# Patient Record
Sex: Female | Born: 1945 | Race: White | Hispanic: No | Smoking: Former smoker
Health system: Southern US, Community
[De-identification: ages and names within clinical notes are randomized; demographics above are authoritative.]

## PROBLEM LIST (undated history)

## (undated) DIAGNOSIS — M1990 Unspecified osteoarthritis, unspecified site: Secondary | ICD-10-CM

## (undated) DIAGNOSIS — Z860100 Personal history of colon polyps, unspecified: Secondary | ICD-10-CM

## (undated) DIAGNOSIS — F419 Anxiety disorder, unspecified: Secondary | ICD-10-CM

## (undated) DIAGNOSIS — R110 Nausea: Secondary | ICD-10-CM

## (undated) DIAGNOSIS — R202 Paresthesia of skin: Secondary | ICD-10-CM

## (undated) DIAGNOSIS — E039 Hypothyroidism, unspecified: Secondary | ICD-10-CM

## (undated) DIAGNOSIS — H269 Unspecified cataract: Secondary | ICD-10-CM

## (undated) DIAGNOSIS — E119 Type 2 diabetes mellitus without complications: Secondary | ICD-10-CM

## (undated) DIAGNOSIS — M62838 Other muscle spasm: Secondary | ICD-10-CM

## (undated) DIAGNOSIS — R0600 Dyspnea, unspecified: Secondary | ICD-10-CM

## (undated) DIAGNOSIS — M810 Age-related osteoporosis without current pathological fracture: Secondary | ICD-10-CM

## (undated) DIAGNOSIS — E785 Hyperlipidemia, unspecified: Secondary | ICD-10-CM

## (undated) DIAGNOSIS — T7840XA Allergy, unspecified, initial encounter: Secondary | ICD-10-CM

## (undated) DIAGNOSIS — D649 Anemia, unspecified: Secondary | ICD-10-CM

## (undated) DIAGNOSIS — G4700 Insomnia, unspecified: Secondary | ICD-10-CM

## (undated) DIAGNOSIS — E079 Disorder of thyroid, unspecified: Secondary | ICD-10-CM

## (undated) DIAGNOSIS — R351 Nocturia: Secondary | ICD-10-CM

## (undated) DIAGNOSIS — G8929 Other chronic pain: Secondary | ICD-10-CM

## (undated) DIAGNOSIS — G4730 Sleep apnea, unspecified: Secondary | ICD-10-CM

## (undated) DIAGNOSIS — F32A Depression, unspecified: Secondary | ICD-10-CM

## (undated) DIAGNOSIS — R200 Anesthesia of skin: Secondary | ICD-10-CM

## (undated) DIAGNOSIS — F329 Major depressive disorder, single episode, unspecified: Secondary | ICD-10-CM

## (undated) DIAGNOSIS — Z8619 Personal history of other infectious and parasitic diseases: Secondary | ICD-10-CM

## (undated) DIAGNOSIS — K219 Gastro-esophageal reflux disease without esophagitis: Secondary | ICD-10-CM

## (undated) DIAGNOSIS — Z8719 Personal history of other diseases of the digestive system: Secondary | ICD-10-CM

## (undated) DIAGNOSIS — M2550 Pain in unspecified joint: Secondary | ICD-10-CM

## (undated) DIAGNOSIS — M797 Fibromyalgia: Secondary | ICD-10-CM

## (undated) DIAGNOSIS — M549 Dorsalgia, unspecified: Secondary | ICD-10-CM

## (undated) DIAGNOSIS — I10 Essential (primary) hypertension: Secondary | ICD-10-CM

## (undated) HISTORY — DX: Unspecified osteoarthritis, unspecified site: M19.90

## (undated) HISTORY — DX: Gastro-esophageal reflux disease without esophagitis: K21.9

## (undated) HISTORY — PX: EYE SURGERY: SHX253

## (undated) HISTORY — PX: HAND SURGERY: SHX662

## (undated) HISTORY — PX: CARPAL TUNNEL RELEASE: SHX101

## (undated) HISTORY — DX: Anemia, unspecified: D64.9

## (undated) HISTORY — PX: KNEE ARTHROSCOPY: SHX127

## (undated) HISTORY — DX: Allergy, unspecified, initial encounter: T78.40XA

## (undated) HISTORY — PX: ABDOMINAL HYSTERECTOMY: SHX81

## (undated) HISTORY — PX: KNEE ARTHROSCOPY: SUR90

## (undated) HISTORY — DX: Disorder of thyroid, unspecified: E07.9

## (undated) HISTORY — PX: WRIST SURGERY: SHX841

## (undated) HISTORY — PX: CHOLECYSTECTOMY: SHX55

## (undated) HISTORY — PX: FOOT SURGERY: SHX648

## (undated) HISTORY — PX: FRACTURE SURGERY: SHX138

## (undated) HISTORY — DX: Anxiety disorder, unspecified: F41.9

## (undated) HISTORY — PX: RECTOCELE REPAIR: SHX761

## (undated) HISTORY — PX: TONSILLECTOMY: SUR1361

## (undated) HISTORY — PX: CARDIAC CATHETERIZATION: SHX172

## (undated) HISTORY — PX: COLONOSCOPY: SHX174

## (undated) HISTORY — PX: JOINT REPLACEMENT: SHX530

---

## 1985-06-24 HISTORY — PX: HAND SURGERY: SHX662

## 1998-03-25 ENCOUNTER — Encounter: Admission: RE | Admit: 1998-03-25 | Discharge: 1998-06-23 | Payer: Self-pay | Admitting: Specialist

## 1999-05-17 ENCOUNTER — Other Ambulatory Visit: Admission: RE | Admit: 1999-05-17 | Discharge: 1999-05-17 | Payer: Self-pay | Admitting: Obstetrics and Gynecology

## 1999-08-17 ENCOUNTER — Ambulatory Visit (HOSPITAL_COMMUNITY): Admission: RE | Admit: 1999-08-17 | Discharge: 1999-08-17 | Payer: Self-pay | Admitting: *Deleted

## 1999-08-17 ENCOUNTER — Encounter (INDEPENDENT_AMBULATORY_CARE_PROVIDER_SITE_OTHER): Payer: Self-pay | Admitting: Specialist

## 2000-04-03 ENCOUNTER — Ambulatory Visit (HOSPITAL_BASED_OUTPATIENT_CLINIC_OR_DEPARTMENT_OTHER): Admission: RE | Admit: 2000-04-03 | Discharge: 2000-04-03 | Payer: Self-pay | Admitting: Orthopedic Surgery

## 2000-06-13 ENCOUNTER — Other Ambulatory Visit: Admission: RE | Admit: 2000-06-13 | Discharge: 2000-06-13 | Payer: Self-pay | Admitting: Internal Medicine

## 2001-01-11 ENCOUNTER — Ambulatory Visit (HOSPITAL_COMMUNITY): Admission: RE | Admit: 2001-01-11 | Discharge: 2001-01-11 | Payer: Self-pay | Admitting: Orthopedic Surgery

## 2001-04-07 ENCOUNTER — Ambulatory Visit (HOSPITAL_BASED_OUTPATIENT_CLINIC_OR_DEPARTMENT_OTHER): Admission: RE | Admit: 2001-04-07 | Discharge: 2001-04-07 | Payer: Self-pay | Admitting: Internal Medicine

## 2001-04-25 ENCOUNTER — Emergency Department (HOSPITAL_COMMUNITY): Admission: EM | Admit: 2001-04-25 | Discharge: 2001-04-25 | Payer: Self-pay | Admitting: Emergency Medicine

## 2001-04-25 ENCOUNTER — Encounter: Payer: Self-pay | Admitting: Emergency Medicine

## 2002-09-13 ENCOUNTER — Inpatient Hospital Stay (HOSPITAL_COMMUNITY): Admission: EM | Admit: 2002-09-13 | Discharge: 2002-09-16 | Payer: Self-pay | Admitting: *Deleted

## 2002-12-16 ENCOUNTER — Encounter: Admission: RE | Admit: 2002-12-16 | Discharge: 2002-12-16 | Payer: Self-pay | Admitting: Orthopedic Surgery

## 2002-12-16 ENCOUNTER — Encounter: Payer: Self-pay | Admitting: Orthopedic Surgery

## 2003-10-29 ENCOUNTER — Ambulatory Visit (HOSPITAL_COMMUNITY): Admission: RE | Admit: 2003-10-29 | Discharge: 2003-10-29 | Payer: Self-pay | Admitting: *Deleted

## 2003-10-29 ENCOUNTER — Encounter (INDEPENDENT_AMBULATORY_CARE_PROVIDER_SITE_OTHER): Payer: Self-pay | Admitting: *Deleted

## 2004-01-11 ENCOUNTER — Encounter: Admission: RE | Admit: 2004-01-11 | Discharge: 2004-01-11 | Payer: Self-pay | Admitting: Internal Medicine

## 2005-01-22 ENCOUNTER — Emergency Department (HOSPITAL_COMMUNITY): Admission: EM | Admit: 2005-01-22 | Discharge: 2005-01-22 | Payer: Self-pay | Admitting: Family Medicine

## 2005-03-24 ENCOUNTER — Ambulatory Visit (HOSPITAL_COMMUNITY): Admission: RE | Admit: 2005-03-24 | Discharge: 2005-03-24 | Payer: Self-pay | Admitting: *Deleted

## 2005-05-25 ENCOUNTER — Emergency Department (HOSPITAL_COMMUNITY): Admission: EM | Admit: 2005-05-25 | Discharge: 2005-05-25 | Payer: Self-pay | Admitting: Emergency Medicine

## 2005-06-17 ENCOUNTER — Emergency Department (HOSPITAL_COMMUNITY): Admission: EM | Admit: 2005-06-17 | Discharge: 2005-06-17 | Payer: Self-pay | Admitting: Family Medicine

## 2005-06-21 ENCOUNTER — Emergency Department (HOSPITAL_COMMUNITY): Admission: EM | Admit: 2005-06-21 | Discharge: 2005-06-22 | Payer: Self-pay | Admitting: Emergency Medicine

## 2005-12-11 ENCOUNTER — Emergency Department (HOSPITAL_COMMUNITY): Admission: EM | Admit: 2005-12-11 | Discharge: 2005-12-11 | Payer: Self-pay | Admitting: Emergency Medicine

## 2005-12-12 ENCOUNTER — Emergency Department (HOSPITAL_COMMUNITY): Admission: EM | Admit: 2005-12-12 | Discharge: 2005-12-12 | Payer: Self-pay | Admitting: Family Medicine

## 2006-06-11 ENCOUNTER — Encounter: Admission: RE | Admit: 2006-06-11 | Discharge: 2006-06-11 | Payer: Self-pay | Admitting: Internal Medicine

## 2006-09-20 ENCOUNTER — Encounter: Admission: RE | Admit: 2006-09-20 | Discharge: 2006-09-20 | Payer: Self-pay | Admitting: Internal Medicine

## 2006-11-28 DIAGNOSIS — I10 Essential (primary) hypertension: Secondary | ICD-10-CM | POA: Insufficient documentation

## 2006-11-28 DIAGNOSIS — F32A Depression, unspecified: Secondary | ICD-10-CM | POA: Insufficient documentation

## 2007-07-26 HISTORY — PX: PITUITARY SURGERY: SHX203

## 2008-08-06 ENCOUNTER — Other Ambulatory Visit: Admission: RE | Admit: 2008-08-06 | Discharge: 2008-08-06 | Payer: Self-pay | Admitting: Obstetrics & Gynecology

## 2008-08-06 ENCOUNTER — Ambulatory Visit: Payer: Self-pay | Admitting: Obstetrics & Gynecology

## 2008-08-26 ENCOUNTER — Ambulatory Visit: Payer: Self-pay | Admitting: Obstetrics & Gynecology

## 2010-05-31 ENCOUNTER — Ambulatory Visit (HOSPITAL_COMMUNITY): Admission: RE | Admit: 2010-05-31 | Discharge: 2010-05-31 | Payer: Self-pay | Admitting: Endocrinology

## 2011-03-03 ENCOUNTER — Emergency Department (HOSPITAL_COMMUNITY): Payer: No Typology Code available for payment source

## 2011-03-03 ENCOUNTER — Emergency Department (HOSPITAL_COMMUNITY)
Admission: EM | Admit: 2011-03-03 | Discharge: 2011-03-03 | Disposition: A | Discharge status: Home / Self Care | Payer: No Typology Code available for payment source | Attending: Emergency Medicine | Admitting: Emergency Medicine

## 2011-03-03 DIAGNOSIS — R51 Headache: Secondary | ICD-10-CM | POA: Insufficient documentation

## 2011-03-03 DIAGNOSIS — E039 Hypothyroidism, unspecified: Secondary | ICD-10-CM | POA: Insufficient documentation

## 2011-03-03 DIAGNOSIS — S139XXA Sprain of joints and ligaments of unspecified parts of neck, initial encounter: Secondary | ICD-10-CM | POA: Insufficient documentation

## 2011-03-03 DIAGNOSIS — M545 Low back pain, unspecified: Secondary | ICD-10-CM | POA: Insufficient documentation

## 2011-03-03 DIAGNOSIS — S335XXA Sprain of ligaments of lumbar spine, initial encounter: Secondary | ICD-10-CM | POA: Insufficient documentation

## 2011-03-03 DIAGNOSIS — M25559 Pain in unspecified hip: Secondary | ICD-10-CM | POA: Insufficient documentation

## 2011-03-03 DIAGNOSIS — M542 Cervicalgia: Secondary | ICD-10-CM | POA: Insufficient documentation

## 2011-03-03 DIAGNOSIS — Z7982 Long term (current) use of aspirin: Secondary | ICD-10-CM | POA: Insufficient documentation

## 2011-03-03 DIAGNOSIS — I10 Essential (primary) hypertension: Secondary | ICD-10-CM | POA: Insufficient documentation

## 2011-03-03 DIAGNOSIS — Z79899 Other long term (current) drug therapy: Secondary | ICD-10-CM | POA: Insufficient documentation

## 2011-03-03 DIAGNOSIS — I2510 Atherosclerotic heart disease of native coronary artery without angina pectoris: Secondary | ICD-10-CM | POA: Insufficient documentation

## 2011-04-18 ENCOUNTER — Ambulatory Visit (HOSPITAL_COMMUNITY)
Admission: RE | Admit: 2011-04-18 | Discharge: 2011-04-18 | Disposition: A | Discharge status: Home / Self Care | Payer: No Typology Code available for payment source | Source: Ambulatory Visit | Attending: Physical Therapy | Admitting: Physical Therapy

## 2011-04-18 DIAGNOSIS — M545 Low back pain, unspecified: Secondary | ICD-10-CM | POA: Insufficient documentation

## 2011-04-18 DIAGNOSIS — R269 Unspecified abnormalities of gait and mobility: Secondary | ICD-10-CM | POA: Insufficient documentation

## 2011-04-18 DIAGNOSIS — R209 Unspecified disturbances of skin sensation: Secondary | ICD-10-CM | POA: Insufficient documentation

## 2011-04-18 DIAGNOSIS — R262 Difficulty in walking, not elsewhere classified: Secondary | ICD-10-CM | POA: Insufficient documentation

## 2011-04-18 DIAGNOSIS — M546 Pain in thoracic spine: Secondary | ICD-10-CM | POA: Insufficient documentation

## 2011-04-18 DIAGNOSIS — IMO0001 Reserved for inherently not codable concepts without codable children: Secondary | ICD-10-CM | POA: Insufficient documentation

## 2011-04-20 ENCOUNTER — Ambulatory Visit (HOSPITAL_COMMUNITY)
Admission: RE | Admit: 2011-04-20 | Discharge: 2011-04-20 | Disposition: A | Discharge status: Home / Self Care | Payer: No Typology Code available for payment source | Source: Ambulatory Visit | Attending: *Deleted | Admitting: *Deleted

## 2011-04-25 ENCOUNTER — Ambulatory Visit (HOSPITAL_COMMUNITY)
Admission: RE | Admit: 2011-04-25 | Discharge: 2011-04-25 | Disposition: A | Discharge status: Home / Self Care | Payer: No Typology Code available for payment source | Source: Ambulatory Visit | Attending: Physical Therapy | Admitting: Physical Therapy

## 2011-04-25 DIAGNOSIS — IMO0001 Reserved for inherently not codable concepts without codable children: Secondary | ICD-10-CM | POA: Insufficient documentation

## 2011-04-25 DIAGNOSIS — R269 Unspecified abnormalities of gait and mobility: Secondary | ICD-10-CM | POA: Insufficient documentation

## 2011-04-25 DIAGNOSIS — M545 Low back pain, unspecified: Secondary | ICD-10-CM | POA: Insufficient documentation

## 2011-04-25 DIAGNOSIS — R262 Difficulty in walking, not elsewhere classified: Secondary | ICD-10-CM | POA: Insufficient documentation

## 2011-04-25 DIAGNOSIS — M546 Pain in thoracic spine: Secondary | ICD-10-CM | POA: Insufficient documentation

## 2011-04-25 DIAGNOSIS — R209 Unspecified disturbances of skin sensation: Secondary | ICD-10-CM | POA: Insufficient documentation

## 2011-04-27 ENCOUNTER — Ambulatory Visit (HOSPITAL_COMMUNITY): Payer: No Typology Code available for payment source | Admitting: *Deleted

## 2011-05-02 ENCOUNTER — Ambulatory Visit (HOSPITAL_COMMUNITY)
Admission: RE | Admit: 2011-05-02 | Discharge: 2011-05-02 | Disposition: A | Discharge status: Home / Self Care | Payer: No Typology Code available for payment source | Source: Ambulatory Visit | Attending: *Deleted | Admitting: *Deleted

## 2011-05-04 ENCOUNTER — Ambulatory Visit (HOSPITAL_COMMUNITY)
Admission: RE | Admit: 2011-05-04 | Discharge: 2011-05-04 | Disposition: A | Discharge status: Home / Self Care | Payer: No Typology Code available for payment source | Source: Ambulatory Visit | Attending: *Deleted | Admitting: *Deleted

## 2011-05-09 ENCOUNTER — Ambulatory Visit (HOSPITAL_COMMUNITY)
Admission: RE | Admit: 2011-05-09 | Discharge: 2011-05-09 | Disposition: A | Discharge status: Home / Self Care | Payer: No Typology Code available for payment source | Source: Ambulatory Visit | Attending: *Deleted | Admitting: *Deleted

## 2011-05-09 NOTE — Group Therapy Note (Signed)
[name], [name]                 ACCOUNT NO.:  [id number]   MEDICAL RECORD NO.:  [id number]          PATIENT TYPE:  WOC   LOCATION:  [facility]                   FACILITY:  [facility]   PHYSICIAN:  Sid Falcon, CNM  DATE OF BIRTH:  1946-08-03   DATE OF SERVICE:                                  CLINIC NOTE   This patient is here for followup result after a punch biopsy on August 06, 2008.  The patient was seen on that date as a referral from [facility] for lesions in bilateral labial area, scaly  erythematous patches and stenotic vaginal introitus.  The patient was  diagnosed that day with vaginal yeast and atrophic changes with age.  The patient was prescribed Diflucan 150 mg weekly times 2 weeks and a  punch biopsy was sent to Pathology.  The results of the punch biopsy was  benign squamous-lined mucosa with chronic inflammation and  hyperkeratosis.  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  Today the patient  was given the Zosyn and reported that the patient is doing well with  medicated powder that she has been using and the symptoms have improved.  It was recommended to the patient that she use triamcinolone 0.5% b.i.d.  times 1 week and then every day if the symptoms return and the patient  is to follow up in 1 month for assessment.  I consulted with Dr. Penne Lash  and Dr. Penne Lash agrees with the plan.      Sid Falcon, CNM     WM/MEDQ  D:  08/26/2008  T:  08/27/2008  Job:  [id number]

## 2011-05-11 ENCOUNTER — Ambulatory Visit (HOSPITAL_COMMUNITY)
Admission: RE | Admit: 2011-05-11 | Discharge: 2011-05-11 | Disposition: A | Discharge status: Home / Self Care | Payer: No Typology Code available for payment source | Source: Ambulatory Visit | Attending: *Deleted | Admitting: *Deleted

## 2011-05-12 NOTE — Op Note (Signed)
[facility]  Patient:    [name], [name]                        MRN: [id number] Proc. Date: 04/03/00 Adm. Date:  [id number] Attending:  Sypher, Douglass Rivers CC:         Katy Fitch. Sypher, Montez Hageman., M.D. (2)                           Operative Report  PREOPERATIVE DIAGNOSIS:  Entrapment neuropathy, median nerve, left carpal tunnel.  POSTOPERATIVE DIAGNOSIS:  Entrapment neuropathy, median nerve, left carpal tunnel.  OPERATION PERFORMED:  Release of left transverse carpal ligament.  OPERATING SURGEON:  Josephine Igo, M.D.  ASSISTANT:  Gigi Gin, P.A.  ANESTHESIA:  General by mask.  ANESTHESIOLOGIST:  Dr. Katrinka Blazing.  INDICATIONS:  The patient is a 65 year old woman who has had numbness in both hands.  Clinical examination revealed evidence of bilateral carpal tunnel syndrome.  Electrodiagnostic studies confirmed median neuropathy at the wrist bilateral.  She is status post right carpal tunnel release with good result and requested similar surgery on the left.  She understands the potential risks and benefits of surgery.  DESCRIPTION OF PROCEDURE:  [name] was brought to the operating room and placed in supine position on the operating table.  Following induction of genreal anesthesia, the left arm was prepped with Betadine soap and solution and sterilely draped.  Following exsanguination of the limb with an Esmarch bandage, the arterial tourniquet was inflated to 220 mmHg.  The procedure commenced with a short incision in line of the ring finger in the palm.  The subcutaneous tissues were carefully divided revealing the palmar fascia.  This was split longitudinally to reveal the common sensory branch of the median nerve.  These were followed back to the transverse carpal ligament which was carefully isolated from the median nerve.  The ligament was released on its ulnar border with scissors extending into the distal forearm.  This widely  opened the carpal canal.  Bleeding points along the margin of the released ligament were electrocauterized with bipolar current followed by repair of the skin with intradermal 3-0 Prolene suture.  A compressive dressing was applied with a volar plaster splint maintaining the wrist in 5 degrees dorsiflexon.  For aftercare, Ms. Crafton is given a prescription for Percocet 5/325 one or two tablets p.o. q.4-6h. p.r.n. pain, 20 tablets, no refill. DD:  04/03/00 TD:  04/03/00 Job: [id number]

## 2011-05-12 NOTE — Cardiovascular Report (Signed)
   NAME:  [name], [name]                           ACCOUNT NO.:  [id number]   MEDICAL RECORD NO.:  [id number]                   PATIENT TYPE:  INP   LOCATION:  3731                                 FACILITY:  [facility]   PHYSICIAN:  W. Ashley Royalty., M.D.         DATE OF BIRTH:  02-23-1946   DATE OF PROCEDURE:  09/15/2002  DATE OF DISCHARGE:  09/16/2002                              CARDIAC CATHETERIZATION   INDICATION:  The patient is a 65 year old female with chest pain suggestive  of unstable angina with borderline enzymes.   COMMENTS ABOUT PROCEDURE:  The patient tolerated the procedure well without  complications.  Following the procedure she had good hemostasis and  peripheral pulses present.   HEMODYNAMIC DATA:  Aorta post-contrast 136/75, LV post-contrast 136/0-12.   ANGIOGRAPHIC DATA:   LEFT VENTRICULOGRAM:  Left ventriculogram performed in the 30-degree RAO  projection. Aortic valve was normal. The mitral valve is normal. The left  ventricle appears normal in size.  The estimated ejection fraction is 60%.  Coronary arteries arise and distribute normally. There was minimal  calcification noted in the left coronary system.   The left main coronary artery:  Appears normal.   Left anterior descending:  The vessel ends on the distal anterior wall and  has a large first diagonal branch. There is a mid vessel 30-40% stenosis  that involves the origin of the diagonal branch.   The circumflex coronary artery:  This is a large vessel with two marginal  branches that have moderate degrees of tortuosity to them.  Minimal  irregularities are noted.   The right coronary artery:  A large dominant vessel of posterior descending  artery supplies the apex.  There are moderate irregularities noted with  this.  No focal obstructive stenoses noted.    IMPRESSION:  1. Mild to moderate coronary artery disease without evidence of severe focal     obstructive stenoses noted.  2. Normal  left ventricular function.                                                    Darden Palmer., M.D.    WST/MEDQ  D:  09/15/2002  T:  09/17/2002  Job:  [id number]   cc:   Lennon Alstrom. Felipa Eth, M.D.   Thomas A. Patty Sermons, M.D.

## 2011-05-12 NOTE — Op Note (Signed)
   NAME:  [name], [name]                           ACCOUNT NO.:  [id number]   MEDICAL RECORD NO.:  [id number]                   PATIENT TYPE:  AMB   LOCATION:  ENDO                                 FACILITY:  [facility]   PHYSICIAN:  Georgiana Spinner, M.D.                 DATE OF BIRTH:  19-Feb-1946   DATE OF PROCEDURE:  10/29/2003  DATE OF DISCHARGE:                                 OPERATIVE REPORT   PROCEDURE:  Upper endoscopy with biopsy.   INDICATIONS FOR PROCEDURE:  Hemoccult positivity.   ANESTHESIA:  Demerol 50, Versed 5 mg.   DESCRIPTION OF PROCEDURE:  With the patient mildly sedated in the left  lateral decubitus position, the Olympus videoscopic endoscope was inserted  in the mouth, passed under direct vision through the esophagus which  appeared normal into the stomach. Fundus, body, antrum were visualized and  blood was seen in the antrum in flecks.  The mucosa appeared fairly  erythematous.  This was photographed and subsequently biopsied.  Duodenal  bulb and second portion of the duodenum appeared normal.  From this point,  the endoscope was slowly withdrawn, taking circumferential views of the  duodenal mucosa until the endoscope then pulled back into the stomach,  placed in retroflexion to view the stomach from below. The endoscope was  then straightened and withdrawn taking circumferential views of the  remaining gastric and esophageal mucosa.  The patient's vital signs and  pulse oximetry remained stable.  The patient tolerated the procedure well  without apparent complications.   FINDINGS:  Erythematous gastric mucosa consistent with gastritis with blood  flecks seen in the stomach. Await biopsy report. The patient will call me  for results and follow up with me as an outpatient.  Proceed to colonoscopy  as planned.                                               Georgiana Spinner, M.D.    GMO/MEDQ  D:  10/29/2003  T:  10/29/2003  Job:  [id number]

## 2011-05-12 NOTE — Op Note (Signed)
   NAME:  [name], [name]                           ACCOUNT NO.:  [id number]   MEDICAL RECORD NO.:  [id number]                   PATIENT TYPE:  AMB   LOCATION:  ENDO                                 FACILITY:  [facility]   PHYSICIAN:  Georgiana Spinner, M.D.                 DATE OF BIRTH:  07-09-1946   DATE OF PROCEDURE:  DATE OF DISCHARGE:                                 OPERATIVE REPORT   PROCEDURE:  Colonoscopy.   INDICATIONS:  Hemoccult positivity.   ANESTHESIA:  Demerol 30 and Versed 3 mg.   PROCEDURE:  With the patient mildly sedated in the left lateral decubitus  position, the Olympus videoscopic colonoscope was inserted in the rectum and  passed under direct vision to the cecum, identified by the ileocecal valve  and appendiceal orifice, the former of which was photographed.  Actually,  they were both photographed, but we only got one picture of the valve.  From  this point, the colonoscope was slowly withdrawn, taking circumferential  views of the colonic mucosa, stopping only in the rectum, which appeared  normal on direct and retroflexed view.  The endoscope was straightened and  withdrawn.  The patient's vital signs and pulse oximeter remained stable.  The patient tolerated the procedure well without apparent complications.   FINDINGS:  Unremarkable colonoscopic examination.   PLAN:  See endoscopy note for further details.                                               Georgiana Spinner, M.D.    GMO/MEDQ  D:  10/29/2003  T:  10/29/2003  Job:  [id number]

## 2011-05-12 NOTE — Consult Note (Signed)
NAME:  [name], [name]                           ACCOUNT NO.:  [id number]   MEDICAL RECORD NO.:  [id number]                   PATIENT TYPE:  INP   LOCATION:  3731                                 FACILITY:  [facility]   PHYSICIAN:  Maisie Fus A. Patty Sermons, M.D.           DATE OF BIRTH:  May 25, 1946   DATE OF CONSULTATION:  09/14/2002  DATE OF DISCHARGE:                                   CONSULTATION   CHIEF COMPLAINT:  Chest pain.   HISTORY OF PRESENT ILLNESS:  This is a 65 year old married Caucasian female  admitted with chest pain.  About a week ago she begun noticing the onset of  a vague discomfort in the precordial area.  It was worse yesterday, and she  tried to go to work, but pain worsened and was not relieved by muscle  relaxer or by Vicodin or by Ultram.  She went to [facility]  where she was evaluated, and a chest x-ray suggested possibly widened  mediastinum, and she was sent by ambulance to [facility].  The CT scan at [facility] did  not show any widening of the mediastinum, but the patient was admitted on a  rule out myocardial infarction protocol.  The pain that she has been  experiencing is precordial.  She has also been noticing some numbness of  both hands.  The pain is sharp and is worse with deep breathing.  The  patient does not have any history of angina pectoris.  She is not diabetic,  but does have a history of hypertension.  She also has a history of a hiatal  hernia.  She noted mild diaphoresis with the pain yesterday, but no nausea  or vomiting.  Today, she continues to have chest discomfort relieved by pain  medication.   MEDICATIONS:  1. Diovan HCT 160/12.5 one q.d.  2. Effexor 150 mg q.d.  3. Vitamin E q.d.  4. Aspirin 81 mg q.d.  5. Ranitidine or Prevacid p.r.n.  6. Iron q.d.  7. Ultram p.r.n. for her back pain.  8. Fosamax as directed.  9. Calcium.   FAMILY HISTORY:  The patient's father died with Alzheimer's at age 33.  The  patient's mother is  alive at age 66.  Mother has diabetes.  The patient has  two brothers both with coronary disease, and her younger brother has had  bypass x5.   ALLERGIES:  SULFA, V-CILLIN.   REVIEW OF SYMPTOMS:  CARDIOVASCULAR:  No history of stroke or transient  ischemic attack.  Her cholesterols have been borderline high in the past,  but she has not been on statin drugs until this time.  There has been no  awareness of racing of her heart.   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  She is  a non-smoker.  She is married for the second time and has two children by  the previous marriage.   PHYSICAL EXAMINATION:  VITAL SIGNS:  Blood pressure is 130/76, pulse 84 and  regular, respirations are normal.  HEENT:  Negative.  Pupils are equal and reactive.  Sclerae are clear.  NECK:  Jugular venous pressure normal.  Carotids normal.  Thyroid normal.  CHEST:  Clear.  HEART:  A quiet precordium without murmur, gallop, rub, or click.  She is  tender in the lower sternum on palpation.  ABDOMEN:  Soft and nontender without hepatosplenomegaly or masses, and she  has a previous cholecystectomy.  EXTREMITIES:  Good peripheral pulses and no phlebitis or edema.   LABORATORY DATA:  Electrocardiogram shows normal sinus rhythm and is within  normal limits.  Laboratory studies show sodium of 143, potassium 3.0, BUN 9,  creatinine 0.6.  Her initial CK was 2.8, subsequent 5.5.  Initial troponin-I  of 0.01, subsequent was 0.02.   IMPRESSION:  1. Chest pain, uncertain etiology with borderline CK-MB's, rule out ischemic     heart disease or acute coronary syndrome.  2. Superimposed chest wall pain with chest wall tenderness.  3. Multiple cardiac risk factors, including hypertension, possibly     hypercholesterolemia, and a strong family history of manifested coronary     disease.  4. Hypokalemia, presumed secondary to Thiazide diuretic.   DISPOSITION:  1. Replete potassium.  2.  Diagnostic left heart cardiac catheterization will be scheduled for     Monday a.m. with one of my partners.                                               Thomas A. Patty Sermons, M.D.    TAB/MEDQ  D:  09/14/2002  T:  09/14/2002  Job:  [id number]   cc:   Daleen Bo R. Felipa Eth, M.D.   Darden Palmer., M.D.  [address], [address]  [place]  [place] [address]  Fax: [phone]   Rolm Gala, M.D.  [facility]

## 2011-05-12 NOTE — Op Note (Signed)
[name], GORIS                 ACCOUNT NO.:  [id number]   MEDICAL RECORD NO.:  [id number]          PATIENT TYPE:  AMB   LOCATION:  ENDO                         FACILITY:  [facility]   PHYSICIAN:  Georgiana Spinner, M.D.    DATE OF BIRTH:  Apr 12, 1946   DATE OF PROCEDURE:  DATE OF DISCHARGE:                                 OPERATIVE REPORT   PROCEDURE:  Colonoscopy.   INDICATIONS:  Question of colon polyps   ANESTHESIA:  Demerol 70, Versed 8 mg.   PROCEDURE:  With the patient mildly sedated in the left lateral decubitus  position, the Olympus videoscopic colonoscope was inserted in the rectum and  passed under direct vision. With pressure applied to the abdomen, we reached  the cecum identified by ileocecal valve and appendiceal orifice. We  thoroughly looked at the cecum and found absolutely no evidence of polyps in  this area.  The colonoscope was then slowly withdrawn, taking  circumferential views of the colonic mucosa, stopping only in the rectum  which appeared normal on direct and retroflexed view. The endoscope was  straightened and withdrawn. The patient's vital signs, pulse oximetry  remained stable. The patient tolerated the procedure well without apparent  complications.   FINDINGS:  Negative examination including a thorough examination of the  cecum. No evidence of polyp.   PLAN:  Have the patient follow-up with me as needed.      GMO/MEDQ  D:  03/24/2005  T:  03/24/2005  Job:  [id number]   cc:   Larina Earthly, M.D.  [address]  [place]  [place] [address]  Fax: [phone]

## 2011-05-12 NOTE — H&P (Signed)
NAME:  [name], [name]                           ACCOUNT NO.:  [id number]   MEDICAL RECORD NO.:  [id number]                   PATIENT TYPE:  EMS   LOCATION:  MAJO                                 FACILITY:  [facility]   PHYSICIAN:  Ravi R. Felipa Eth, M.D.                  DATE OF BIRTH:  [birth year]   DATE OF ADMISSION:  09/13/2002  DATE OF DISCHARGE:                                HISTORY & PHYSICAL   CHIEF COMPLAINT:  Chest pain.   HISTORY OF PRESENT ILLNESS:  This is a 65 year old Caucasian female well-  known to me from my outpatient practice who has a past medical history  significant for hypertension, obesity, gastroesophageal reflux disease,  questionable fibromyalgia, degenerative disc disease, osteopenia and mild  hyperlipidemia; who presented to the local urgent care with one-week history  of bandlike chest pain that gets worse upon turning over in bed, moving  around, or deep breathing.  The patient states that this chest discomfort  was not associated with shortness of breath, nausea or diaphoresis. The pain  was more pronounced on the right side of her chest wall radiating into the  middle part of her chest and then out to the left side of her chest.  The  patient denies any radiation to the neck or shoulder area.  The patient  denies any fevers, chills, orthopnea, cough, nausea, or recent injury.  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  Given the fact that the pain  worsened today, the patient presented to urgent care where a chest x-ray  revealed a widened mediastinum.  Subsequently the patient was transferred to  the emergency room at [facility]. [facility], given the question of  aortic dissection.  Chest CT here was negative for pulmonary embolism and  aortic dissection.  Given the patient's risk factors including a family  history of early heart disease, her hypertension and mild  hyperlipidemia,  the patient is currently being admitted for rule out myocardial infarction  on the observation protocol.   REVIEW OF SYSTEMS:  As above.   PROBLEM LIST:  1. Hypertension.  2. Obesity.  3. Gastroesophageal reflux disease with negative EGD in October of 2000.  4. Edema.  5. History of fibrositis myofascial.  6. History of sinusitis.  7. History of negative treadmill test in October 1999.  8. Lumbar degenerative disc disease.  9. History of knee surgery February 2002.  10.      History of osteopenia by Dexascan.  11.      History of hysterectomy in 1970.  12.      Mild hyperlipidemia.  13.      History of echocardiogram  October 2002, revealing mild left     ventricular hypertrophy and diastolic dysfunction.   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  Denies any tobacco or alcohol abuse.   FAMILY HISTORY:  Significant for early heart disease and hypertension.   ALLERGIES:  SULFA, PENICILLIN.   CURRENT MEDICATIONS:  1. Diovan HCT 160/12.5 one p.o. q.d.  2. Effexor XR 150 mg po q.a.m. and 75 mg at noon.  3. Iron supplementation twice daily.  4. Fosamax 70 mg p.o. q. week, however, the patient is currently out.  5. Potassium chloride 40 mEq p.o. q.d.  6. Ultram as needed.  7. The patient does have a history of using Bextra 10 mg each day, however,     resulting in some mild GI upset.  8. The patient also takes vitamin E and baby aspirin each day.   LABORATORY DATA:  Sodium 141, potassium 3.1, BUN 15, creatinine 0.9, glucose  94.  Hemoglobin 13, hematocrit 37%. ABG 7.446 pH, pCO2 38, pO2 94, bicarb  26, 98% oxygen saturation on room air.  Liver function tests, D-dimer, CK,  CK-MB and troponin I are all pending.   Chest CT with contrast reveals no evidence of pulmonary embolism or aortic  dissection, otherwise normal.   Review of office records from August 2003, reveals total cholesterol of 209,   triglycerides 208, HDL cholesterol 72, LDL cholesterol 95.  Normal  comprehensive metabolic profile.  Normal TSH at 1.99.  Normal urinalysis.  Hemoglobin A1C 5.3%.  White blood cell count 9.1, hemoglobin 12.7.   Echocardiogram  from October 14, 2001, reveals left ventricular hypertrophy,  moderate, with evidence of diastolic dysfunction with no significant  valvular abnormalities.  EKG here reveals sinus bradycardia, otherwise  normal.   PHYSICAL EXAMINATION:  GENERAL APPEARANCE:  An obese Caucasian female in no  apparent distress, status post morphine administration, who is conversant  and appropriate.  VITAL SIGNS:  Temperature 97.9 degrees F, blood pressure 152/83, pulse 104,  respiratory rate 14 and nonlabored, oxygen saturation 99% on room air.  Most  recent weight in our office is 204 pounds.  HEENT:  Head normocephalic and atraumatic.  Sclerae are anicteric.  Extraocular movements are intact. There are no oropharyngeal lesions.  No  sinus tenderness.  There are no carotid bruits.  NECK:  Supple. There is no thyromegaly.  There is no axillary  lymphadenopathy.  LUNGS:  Clear to auscultation bilaterally.  CARDIOVASCULAR:  Regular rate and rhythm with no S3 or S4 appreciated, nor  are there any murmurs appreciated.  Palpation of the chest wall reveals  diffuse tenderness and multiple trigger points in the upper torso.  ABDOMEN:  Soft, nontender, nondistended abdomen with bowel sounds present  throughout. There is no hepatosplenomegaly.  EXTREMITIES:  No edema with pulses intact in all four extremities.  No  evidence of cyanosis.  There is no evidence of synovitis.   ASSESSMENT/PLAN:  1. Atypical chest pain consistent with musculoskeletal origin.  Follow-up on     cardiac enzymes, D-dimer and rule out myocardial infarction by serial     enzymes.  If all negative, will perform outpatient Cardiolite.  The    patient may need Cox-II inhibitor; however, she has been susceptible to      these with nausea in the past.  May need prednisone taper and/or possible     treatment of questionable fibromyalgia.  2. Hypertension.  Stable on angiotensin receptor blocker and     hydrochlorothiazide.  May need augmentation of her     therapy if blood pressure  continues to be slightly high on an outpatient     basis.  3. Questionable fibromyalgia with multiple trigger points.  May need an     increased dose of Effexor and/or muscle relaxer.                                                 Ravi R. Felipa Eth, M.D.    RRA/MEDQ  D:  09/13/2002  T:  09/16/2002  Job:  [id number]

## 2011-05-12 NOTE — Discharge Summary (Signed)
NAME:  [name], [name]                           ACCOUNT NO.:  [id number]   MEDICAL RECORD NO.:  [id number]                   PATIENT TYPE:  INP   LOCATION:  3731                                 FACILITY:  [facility]   PHYSICIAN:  Larina Earthly, MD                       DATE OF BIRTH:  [birth year]   DATE OF ADMISSION:  09/13/2002  DATE OF DISCHARGE:  09/16/2002                                 DISCHARGE SUMMARY   DISCHARGE DIAGNOSES:  1. Mild to moderate coronary artery disease status post cardiac     catheterization by Dr. Georga Hacking on 09/15/2002 with one of three     positive cardiac enzymes.  2. Hypertension.  3. Questionable fibromyalgia with multiple trigger points.  4. Obesity.  5. Gastroesophageal reflux disease.  6. Edema.  7. History of fibrositis myofascia.  8. Lumbar degenerative disk disease.  9. Osteopenia.  10.      Mild hyperlipidemia.  11.      History of echocardiogram from October 2002 revealing mild left     ventricular hypertrophy and diastolic dysfunction.   DISCHARGE MEDICATIONS:  1. Diovan HCT 160/12.5 1 p.o. q.d.  2. Effexor XR 150 p.o. q.a.m. and 75 at noon.  3. Iron supplementation twice daily.  4. Fosamax 70 mg p.o. weekly.  5. Potassium chloride 40 mEq p.o. q.d.  6. Ultram as needed.  7. Vicodin 1 to 2 tablets every 6 hours as needed for pain.  8. Enteric-coated baby aspirin 1 p.o. q.d.   LABORATORY DATA:  On admission, white blood cell count 6.5, hemoglobin 10.8,  hematocrit 33.6%, platelet count 226.  PT 13.3 seconds, PTT 131 seconds on  heparin.  Admission sodium 141, potassium 3.1, chloride 107, glucose 94, BUN  15, creatinine 0.9, total protein 6.4, albumin 3.4.  AST 16, ALT 20,  alkaline phosphatase 107, total bilirubin 0.4.  Cardiac enzymes first set:  CK 94, CK-MB 2.8, troponin I 0.01.  Second set: CK 124, CK-MB 5.5, troponin  I 0.02.  Third set: CK 120, CK-MB 3.8, troponin I 0.02.  Total cholesterol  187, triglycerides 99, HDL 51, LDL  116.   EKG on 09/15/2002 reveals normal sinus rhythm, left axis deviation, minimal  voltage criteria for LVH.   Cardiac catheterization from 09/15/2002 reveals a 30 to 40% stenosis in the  left anterior descending artery, otherwise mild tortuosity to the circumflex  and right coronary artery with final impression being mild to moderate  coronary artery disease without evidence of severe focal obstructive  stenosis, normal left ventricular function.   CT of the chest on 09/13/2002 was unremarkable.  No pulmonary emboli were  identified, negative for dissection, and negative for aneurysm.   HISTORY OF PRESENT ILLNESS:  This is a 65 year old Caucasian female who has  a past medical history significant for hypertension, obesity,  gastroesophageal reflux disease, questionable fibromyalgia, degenerative  disk disease, osteopenia, and mild hyperlipidemia, who presented to the  local Urgent Care with a one-week history of band-like chest pain that  worsened upon movement while turning over in bed or deep breathing.  This  was not associated with shortness of breath, nausea, or diaphoresis.  Given  the fact that the pain was progressive and chest x-ray at [facility]  revealed a widened mediastinum, the patient has thus been transported to the  emergency room at [facility] for further evaluation and  questionable aortic dissection.   Chest CT in the emergency room was negative for pulmonary embolus or aortic  dissection; but given the patient's risk factors including family history of  early heart disease, hypertension, mild hyperlipidemia, the patient was  admitted for rule out myocardial infarction.   HOSPITAL COURSE:  The patient continued to have some mild chest discomfort  during hospitalization, and heparin was initiated.  Given the fact that the  second of three sets of cardiac enzymes was mildly positive with elevated CK-  MB, the patient underwent cardiac  catheterization with results mentioned  above.  Given that there was no severe stenosis, the patient was discharged  with followup evaluation in my office for other etiologies to her atypical  chest pain.  Upon followup, issues such as hyperlipidemia, antiplatelet  therapy, possible gastroesophageal reflux disease, and musculoskeletal  issues will be reviewed.                                                Larina Earthly, MD    RA/MEDQ  D:  10/21/2002  T:  10/21/2002  Job:  [id number]   cc:   Cassell Clement, MD   W. Ashley Royalty., M.D.  [address], [address]  [place]  [place] [address]  Fax: [phone]

## 2011-05-16 ENCOUNTER — Ambulatory Visit (HOSPITAL_COMMUNITY)
Admission: RE | Admit: 2011-05-16 | Discharge: 2011-05-16 | Disposition: A | Discharge status: Home / Self Care | Payer: No Typology Code available for payment source | Source: Ambulatory Visit | Attending: *Deleted | Admitting: *Deleted

## 2011-05-18 ENCOUNTER — Ambulatory Visit (HOSPITAL_COMMUNITY)
Admission: RE | Admit: 2011-05-18 | Discharge: 2011-05-18 | Disposition: A | Discharge status: Home / Self Care | Payer: No Typology Code available for payment source | Source: Ambulatory Visit | Attending: *Deleted | Admitting: *Deleted

## 2011-05-23 ENCOUNTER — Ambulatory Visit (HOSPITAL_COMMUNITY)
Admission: RE | Admit: 2011-05-23 | Discharge: 2011-05-23 | Disposition: A | Discharge status: Home / Self Care | Payer: No Typology Code available for payment source | Source: Ambulatory Visit | Attending: *Deleted | Admitting: *Deleted

## 2011-05-25 ENCOUNTER — Ambulatory Visit (HOSPITAL_COMMUNITY)
Admission: RE | Admit: 2011-05-25 | Discharge: 2011-05-25 | Disposition: A | Discharge status: Home / Self Care | Payer: No Typology Code available for payment source | Source: Ambulatory Visit | Attending: *Deleted | Admitting: *Deleted

## 2011-05-30 ENCOUNTER — Ambulatory Visit (HOSPITAL_COMMUNITY): Payer: No Typology Code available for payment source | Admitting: Physical Therapy

## 2011-06-01 ENCOUNTER — Ambulatory Visit (HOSPITAL_COMMUNITY)
Admission: RE | Admit: 2011-06-01 | Discharge: 2011-06-01 | Disposition: A | Discharge status: Home / Self Care | Payer: No Typology Code available for payment source | Source: Ambulatory Visit | Attending: Physical Therapy | Admitting: Physical Therapy

## 2011-06-01 DIAGNOSIS — R209 Unspecified disturbances of skin sensation: Secondary | ICD-10-CM | POA: Insufficient documentation

## 2011-06-01 DIAGNOSIS — R262 Difficulty in walking, not elsewhere classified: Secondary | ICD-10-CM | POA: Insufficient documentation

## 2011-06-01 DIAGNOSIS — M546 Pain in thoracic spine: Secondary | ICD-10-CM | POA: Insufficient documentation

## 2011-06-01 DIAGNOSIS — M545 Low back pain, unspecified: Secondary | ICD-10-CM | POA: Insufficient documentation

## 2011-06-01 DIAGNOSIS — IMO0001 Reserved for inherently not codable concepts without codable children: Secondary | ICD-10-CM | POA: Insufficient documentation

## 2011-06-01 DIAGNOSIS — R269 Unspecified abnormalities of gait and mobility: Secondary | ICD-10-CM | POA: Insufficient documentation

## 2011-06-07 ENCOUNTER — Inpatient Hospital Stay (HOSPITAL_COMMUNITY)
Admission: RE | Admit: 2011-06-07 | Discharge: 2011-06-07 | Disposition: A | Discharge status: Home / Self Care | Payer: No Typology Code available for payment source | Source: Ambulatory Visit | Attending: *Deleted | Admitting: *Deleted

## 2011-06-08 ENCOUNTER — Ambulatory Visit (HOSPITAL_COMMUNITY)
Admission: RE | Admit: 2011-06-08 | Discharge: 2011-06-08 | Disposition: A | Discharge status: Home / Self Care | Payer: No Typology Code available for payment source | Source: Ambulatory Visit | Attending: *Deleted | Admitting: *Deleted

## 2011-06-13 ENCOUNTER — Ambulatory Visit (HOSPITAL_COMMUNITY)
Admission: RE | Admit: 2011-06-13 | Discharge: 2011-06-13 | Disposition: A | Discharge status: Home / Self Care | Payer: No Typology Code available for payment source | Source: Ambulatory Visit | Attending: *Deleted | Admitting: *Deleted

## 2011-06-15 ENCOUNTER — Ambulatory Visit (HOSPITAL_COMMUNITY)
Admission: RE | Admit: 2011-06-15 | Discharge: 2011-06-15 | Disposition: A | Discharge status: Home / Self Care | Payer: No Typology Code available for payment source | Source: Ambulatory Visit | Attending: *Deleted | Admitting: *Deleted

## 2012-04-06 ENCOUNTER — Emergency Department (HOSPITAL_COMMUNITY)
Admission: EM | Admit: 2012-04-06 | Discharge: 2012-04-07 | Disposition: A | Discharge status: Home / Self Care | Payer: Medicare Other | Attending: Emergency Medicine | Admitting: Emergency Medicine

## 2012-04-06 ENCOUNTER — Encounter (HOSPITAL_COMMUNITY): Payer: Self-pay | Admitting: *Deleted

## 2012-04-06 DIAGNOSIS — S62329A Displaced fracture of shaft of unspecified metacarpal bone, initial encounter for closed fracture: Secondary | ICD-10-CM | POA: Insufficient documentation

## 2012-04-06 DIAGNOSIS — S6990XA Unspecified injury of unspecified wrist, hand and finger(s), initial encounter: Secondary | ICD-10-CM | POA: Insufficient documentation

## 2012-04-06 DIAGNOSIS — S62305A Unspecified fracture of fourth metacarpal bone, left hand, initial encounter for closed fracture: Secondary | ICD-10-CM

## 2012-04-06 DIAGNOSIS — Z79899 Other long term (current) drug therapy: Secondary | ICD-10-CM | POA: Insufficient documentation

## 2012-04-06 DIAGNOSIS — M25549 Pain in joints of unspecified hand: Secondary | ICD-10-CM | POA: Insufficient documentation

## 2012-04-06 DIAGNOSIS — M79609 Pain in unspecified limb: Secondary | ICD-10-CM | POA: Insufficient documentation

## 2012-04-06 DIAGNOSIS — S60229A Contusion of unspecified hand, initial encounter: Secondary | ICD-10-CM | POA: Insufficient documentation

## 2012-04-06 DIAGNOSIS — W1809XA Striking against other object with subsequent fall, initial encounter: Secondary | ICD-10-CM | POA: Insufficient documentation

## 2012-04-06 DIAGNOSIS — M25449 Effusion, unspecified hand: Secondary | ICD-10-CM | POA: Insufficient documentation

## 2012-04-06 DIAGNOSIS — I10 Essential (primary) hypertension: Secondary | ICD-10-CM | POA: Insufficient documentation

## 2012-04-06 DIAGNOSIS — Y92009 Unspecified place in unspecified non-institutional (private) residence as the place of occurrence of the external cause: Secondary | ICD-10-CM | POA: Insufficient documentation

## 2012-04-06 DIAGNOSIS — Z7982 Long term (current) use of aspirin: Secondary | ICD-10-CM | POA: Insufficient documentation

## 2012-04-06 HISTORY — DX: Essential (primary) hypertension: I10

## 2012-04-06 NOTE — ED Notes (Signed)
She fell tonight and she had a swollen lt  Hand.  swollen

## 2012-04-07 ENCOUNTER — Emergency Department (HOSPITAL_COMMUNITY): Payer: Medicare Other

## 2012-04-07 MED ORDER — OXYCODONE-ACETAMINOPHEN 5-325 MG PO TABS: 2.0000 | ORAL_TABLET | ORAL | Status: AC | PRN

## 2012-04-07 MED ORDER — OXYCODONE-ACETAMINOPHEN 5-325 MG PO TABS
2.0000 | ORAL_TABLET | Freq: Once | ORAL | Status: AC
  Administered 2012-04-07: 2 via ORAL
  Filled 2012-04-07: qty 2

## 2012-04-07 NOTE — Discharge Instructions (Signed)
Hand Fracture Your caregiver has diagnosed you with a fractured (broken) bone in your hand. If the bones are in good position and the hand is properly immobilized and rested, these injuries will usually heal in 3 to 6 weeks. A cast, splint, or bulky bandage is usually applied to keep the fracture site from moving. Do not remove the splint or cast until your caregiver approves. If the fracture is unstable or the bones are not aligned properly, surgery may be needed. Keep your hand raised (elevated) above the level of your heart as much as possible for the next 2 to 3 days until the swelling and pain are better. Apply ice packs for 15 to 20 minutes every 3 to 4 hours to help control the pain and swelling. See your caregiver or an orthopedic specialist as directed for follow-up care to make sure the fracture is beginning to heal properly. SEEK IMMEDIATE MEDICAL CARE IF:   You notice your fingers are cold, numb, crooked, or the pain of your injury is severe.   You are not improving or seem to be getting worse.   You have questions or concerns.  Document Released: 01/18/2005 Document Revised: 11/30/2011 Document Reviewed: 06/08/2009 [facility] Patient Information 2012 [place], [place].Cast or Splint Care Casts and splints support injured limbs and keep bones from moving while they heal.  HOME CARE  Keep the cast or splint uncovered during the drying period.   A plaster cast can take 24 to 48 hours to dry.   A fiberglass cast will dry in less than 1 hour.   Do not rest the cast on anything harder than a pillow for 24 hours.   Do not put weight on your injured limb. Do not put pressure on the cast. Wait for your doctor's approval.   Keep the cast or splint dry.   Cover the cast or splint with a plastic bag during baths or wet weather.   If you have a cast over your chest and belly (trunk), take sponge baths until the cast is taken off.   Keep your cast or splint clean. Wash a dirty cast with a  damp cloth.   Do not put any objects under your cast or splint. Do not scratch the skin under the cast with an object.   Do not take out the padding from inside your cast.   Exercise your joints near the cast as told by your doctor.   Raise (elevate) your injured limb on 1 or 2 pillows for the first 1 to 3 days.  GET HELP RIGHT AWAY IF:  Your cast or splint cracks.   Your cast or splint is too tight or too loose.   You itch badly under the cast.   Your cast gets wet or has a soft spot.   You have a bad smell coming from the cast.   You get an object stuck under the cast.   Your skin around the cast becomes red or raw.   You have new or more pain after the cast is put on.   You have fluid leaking through the cast.   You cannot move your fingers or toes.   Your fingers or toes turn colors or are cool, painful, or puffy (swollen).   You have tingling or lose feeling (numbness) around the injured area.   You have pain or pressure under the cast.   You have trouble breathing or have shortness of breath.   You have chest pain.  MAKE  SURE YOU:  Understand these instructions.   Will watch your condition.   Will get help right away if you are not doing well or get worse.  Document Released: 04/12/2011 Document Revised: 11/30/2011 Document Reviewed: 04/12/2011 [facility] Patient Information 2012 [place], [place].Metacarpal Fractures Fractures of metacarpals are breaks in the bones of the hand. They extend from the knuckles to the wrist. These bones can break in many ways. There are different ways of treating these fractures. HOME CARE  Only exercise as told by your doctor.   Return to activities as told by your doctor.   Go to physical therapy as told by your doctor.   Follow your doctor's advice about driving.   Keep the injured hand raised (elevated) above the level of your heart.   If a plaster, fiberglass, or pre-formed splint was applied:   Wear your splint as  told and until you are examined again.   Apply ice on the injury for 15 to 20 minutes at a time, 3 to 4 times a day. Put the ice in a plastic bag. Place a towel between your skin and the bag.   Do not get your splint or cast wet. Protect it during bathing with a plastic bag.   Loosen the elastic bandage around the splint if your fingers start to get numb, tingle, get cold, or turn blue.   If the splint is plaster, do not lean it on hard surfaces or put pressure on it for 24 hours after it is put on.   Do not  try to scratch the skin under the cast.   Check the skin around the cast every day. You may put lotion on red or sore areas.   Move the fingers of your casted hand several times a day.   Only take medicine as told by your doctor.   Follow up as told by your doctor. This is very important in order to avoid permanent injury, disability, or lasting (chronic) pain.  GET HELP RIGHT AWAY IF:   You develop a rash.   You have problems breathing.   You have any allergy problems.   You have more than a small spot of blood from beneath your cast or splint.   You have redness, puffiness (swelling), or more pain from beneath your cast or splint.   Yellowish white fluid (pus) comes from beneath your cast or splint.   You develop a temperature by mouth above 102 F (38.9 C), not controlled by medicne.   You have a bad smell coming from under your cast or splint.   You have problems moving any of your fingers.  If you do not have a window in your cast for looking at the wound, a fluid or a little bleeding may show up as a stain on the outside of your cast. Tell your doctor about any stains you see. MAKE SURE YOU:   Understand these instructions.   Will watch your condition.   Will get help right away if you are not doing well or get worse.  Document Released: 05/29/2008 Document Revised: 11/30/2011 Document Reviewed: 11/16/2009 [facility] Patient Information 2012 [place], [place].

## 2012-04-07 NOTE — ED Provider Notes (Signed)
Medical screening examination/treatment/procedure(s) were performed by non-physician practitioner and as supervising physician I was immediately available for consultation/collaboration.   Neelam Tiggs D Eliz Nigg, MD 04/07/12 0933 

## 2012-04-07 NOTE — ED Provider Notes (Signed)
History     CSN: [id number]  Arrival date & time 04/06/12  2339   First MD Initiated Contact with Patient 04/07/12 0109      Chief Complaint  Patient presents with  . Hand Injury    (Consider location/radiation/quality/duration/timing/severity/associated sxs/prior treatment) HPI Comments: The patient here after falling and striking the ulnar side of her  left hand. Reports increased pain this evening has worn on has tried ice and pain medication without relief. Denies numbness or tingling distal to the injury. States still has movement of the fingers. Previous surgery to the left distal radius by Dr. Eulah Pont.  Patient is a 66 y.o. female presenting with hand injury. The history is provided by the patient. No language interpreter was used.  Hand Injury  The incident occurred 3 to 5 hours ago. The incident occurred at home. The injury mechanism was a fall. The pain is present in the left hand. The quality of the pain is described as aching. The pain is at a severity of 8/10. The pain is moderate. The pain has been constant since the incident. Pertinent negatives include no fever and no malaise/fatigue. She reports no foreign bodies present. The symptoms are aggravated by movement. She has tried NSAIDs and ice for the symptoms. The treatment provided no relief.    Past Medical History  Diagnosis Date  . Hypertension     History reviewed. No pertinent past surgical history.  No family history on file.  History  Substance Use Topics  . Smoking status: Never Smoker   . Smokeless tobacco: Not on file  . Alcohol Use: No    OB History    Grav Para Term Preterm Abortions TAB SAB Ect Mult Living                  Review of Systems  Constitutional: Negative for fever and malaise/fatigue.  Musculoskeletal: Positive for joint swelling and arthralgias.  Skin:       Bruising  All other systems reviewed and are negative.    Allergies  Penicillins and Sulfa antibiotics  Home  Medications   Current Outpatient Rx  Name Route Sig Dispense Refill  . ASPIRIN 81 MG PO TABS Oral Take 81 mg by mouth daily.    . BUPROPION HCL ER (SR) 150 MG PO TB12 Oral Take 150 mg by mouth daily.    Marland Kitchen VITAMIN D 1000 UNITS PO TABS Oral Take 2,000 Units by mouth daily.    Marland Kitchen CITALOPRAM HYDROBROMIDE 40 MG PO TABS Oral Take 40 mg by mouth daily.    Marland Kitchen HYDROCODONE-ACETAMINOPHEN 5-325 MG PO TABS Oral Take 1 tablet by mouth every 6 (six) hours as needed. For pain    . LEVOTHYROXINE SODIUM 88 MCG PO TABS Oral Take 88 mcg by mouth daily.    Marland Kitchen POTASSIUM CHLORIDE CRYS ER 20 MEQ PO TBCR Oral Take 20 mEq by mouth 2 (two) times daily.    Marland Kitchen PRAVASTATIN SODIUM 20 MG PO TABS Oral Take 20 mg by mouth daily.    . TRAZODONE HCL 50 MG PO TABS Oral Take 50 mg by mouth at bedtime.    Marland Kitchen VALSARTAN-HYDROCHLOROTHIAZIDE 80-12.5 MG PO TABS Oral Take 1 tablet by mouth daily.    Marland Kitchen VITAMIN B-12 1000 MCG PO TABS Oral Take 1,000 mcg by mouth once a week.    Marland Kitchen VITAMIN E 400 UNITS PO CAPS Oral Take 400 Units by mouth daily.      BP 146/85  Pulse 91  Temp(Src) 98.4 F (  36.9 C) (Oral)  Resp 20  SpO2 96%  Physical Exam  Nursing note and vitals reviewed. Constitutional: She is oriented to person, place, and time. She appears well-developed and well-nourished. No distress.  HENT:  Head: Normocephalic and atraumatic.  Right Ear: External ear normal.  Left Ear: External ear normal.  Nose: Nose normal.  Mouth/Throat: Oropharynx is clear and moist. No oropharyngeal exudate.  Eyes: Conjunctivae are normal. Pupils are equal, round, and reactive to light. No scleral icterus.  Neck: Normal range of motion. Neck supple.  Cardiovascular: Normal rate, regular rhythm and normal heart sounds.  Exam reveals no gallop and no friction rub.   No murmur heard. Pulmonary/Chest: Effort normal and breath sounds normal. No respiratory distress. She has no wheezes. She has no rales. She exhibits no tenderness.  Abdominal: Soft. Bowel  sounds are normal. She exhibits no distension. There is no tenderness.  Musculoskeletal:       Left hand: She exhibits tenderness, bony tenderness and swelling. She exhibits normal range of motion, normal two-point discrimination, normal capillary refill and no deformity. normal sensation noted. Normal strength noted.       Hands: Lymphadenopathy:    She has no cervical adenopathy.  Neurological: She is alert and oriented to person, place, and time. No cranial nerve deficit.  Skin: Skin is warm and dry. No rash noted. No erythema. No pallor.  Psychiatric: She has a normal mood and affect. Her behavior is normal. Judgment and thought content normal.    ED Course  Procedures (including critical care time)  Labs Reviewed - No data to display No results found.  No results found for this or any previous visit. Dg Hand Complete Left  04/07/2012  *RADIOLOGY REPORT*  Clinical Data: Trauma/fall, left hand pain, fourth and fifth metacarpals  LEFT HAND - COMPLETE 3+ VIEW  Comparison: None.  Findings: Mildly displaced oblique fracture of the fourth metacarpal shaft.  Approximately one half shaft width the radial displacement of the distal fracture fragment.  No additional fractures are seen.  Side plate compression screw fixation of the distal radius.  IMPRESSION: Mildly displaced oblique fracture of the fourth metacarpal.  Original Report Authenticated By: Charline Bills, M.D.     4th Metacarpal fracture    MDM  Patient with mildly displaced oblique fracture of the fourth metacarpal. Already sees Dr. Eulah Pont for hand. Placed in volar splint given pain medication and will followup with his office this week.        Izola Price [place], [place] 04/07/12 [phone]

## 2012-04-10 ENCOUNTER — Encounter (HOSPITAL_BASED_OUTPATIENT_CLINIC_OR_DEPARTMENT_OTHER): Payer: Self-pay | Admitting: *Deleted

## 2012-04-10 ENCOUNTER — Encounter (HOSPITAL_BASED_OUTPATIENT_CLINIC_OR_DEPARTMENT_OTHER)
Admission: RE | Admit: 2012-04-10 | Discharge: 2012-04-10 | Disposition: A | Discharge status: Home / Self Care | Payer: Medicare Other | Source: Ambulatory Visit | Attending: Orthopedic Surgery | Admitting: Orthopedic Surgery

## 2012-04-10 LAB — BASIC METABOLIC PANEL
BUN: 9 mg/dL (ref 6–23)
CO2: 27 mEq/L (ref 19–32)
Calcium: 9.3 mg/dL (ref 8.4–10.5)
Chloride: 104 mEq/L (ref 96–112)
Creatinine, Ser: 0.67 mg/dL (ref 0.50–1.10)
GFR calc Af Amer: >90 mL/min (ref >90)
GFR calc non Af Amer: 90 mL/min — ABNORMAL LOW (ref >90)
Glucose, Bld: 99 mg/dL (ref 70–99)
Potassium: 3.4 mEq/L — ABNORMAL LOW (ref 3.5–5.1)
Sodium: 141 mEq/L (ref 135–145)

## 2012-04-10 NOTE — Progress Notes (Signed)
To come in for ekg-bmet To bring cpap-and will use post op

## 2012-04-11 ENCOUNTER — Encounter (HOSPITAL_BASED_OUTPATIENT_CLINIC_OR_DEPARTMENT_OTHER)
Admission: RE | Disposition: A | Discharge status: Home / Self Care | Payer: Self-pay | Source: Ambulatory Visit | Attending: Orthopedic Surgery

## 2012-04-11 ENCOUNTER — Ambulatory Visit (HOSPITAL_BASED_OUTPATIENT_CLINIC_OR_DEPARTMENT_OTHER)
Admission: RE | Admit: 2012-04-11 | Discharge: 2012-04-11 | Disposition: A | Discharge status: Home / Self Care | Payer: Medicare Other | Source: Ambulatory Visit | Attending: Orthopedic Surgery | Admitting: Orthopedic Surgery

## 2012-04-11 ENCOUNTER — Encounter (HOSPITAL_BASED_OUTPATIENT_CLINIC_OR_DEPARTMENT_OTHER): Payer: Self-pay | Admitting: *Deleted

## 2012-04-11 ENCOUNTER — Encounter (HOSPITAL_BASED_OUTPATIENT_CLINIC_OR_DEPARTMENT_OTHER): Payer: Self-pay | Admitting: Anesthesiology

## 2012-04-11 ENCOUNTER — Ambulatory Visit (HOSPITAL_BASED_OUTPATIENT_CLINIC_OR_DEPARTMENT_OTHER): Payer: Medicare Other | Admitting: Anesthesiology

## 2012-04-11 DIAGNOSIS — Z4789 Encounter for other orthopedic aftercare: Secondary | ICD-10-CM

## 2012-04-11 DIAGNOSIS — S62309A Unspecified fracture of unspecified metacarpal bone, initial encounter for closed fracture: Secondary | ICD-10-CM | POA: Insufficient documentation

## 2012-04-11 DIAGNOSIS — W19XXXA Unspecified fall, initial encounter: Secondary | ICD-10-CM | POA: Insufficient documentation

## 2012-04-11 DIAGNOSIS — Z01812 Encounter for preprocedural laboratory examination: Secondary | ICD-10-CM | POA: Insufficient documentation

## 2012-04-11 DIAGNOSIS — Z01810 Encounter for preprocedural cardiovascular examination: Secondary | ICD-10-CM | POA: Insufficient documentation

## 2012-04-11 HISTORY — PX: ORIF FINGER FRACTURE: SHX2122

## 2012-04-11 HISTORY — DX: Depression, unspecified: F32.A

## 2012-04-11 HISTORY — DX: Other chronic pain: G89.29

## 2012-04-11 HISTORY — DX: Major depressive disorder, single episode, unspecified: F32.9

## 2012-04-11 HISTORY — DX: Personal history of other diseases of the digestive system: Z87.19

## 2012-04-11 HISTORY — DX: Dorsalgia, unspecified: M54.9

## 2012-04-11 LAB — POCT HEMOGLOBIN-HEMACUE: Hemoglobin: 11.5 g/dL — ABNORMAL LOW (ref 12.0–15.0)

## 2012-04-11 SURGERY — OPEN REDUCTION INTERNAL FIXATION (ORIF) METACARPAL (FINGER) FRACTURE
Anesthesia: General | Site: Hand | Laterality: Left | Wound class: Clean

## 2012-04-11 MED ORDER — MIDAZOLAM HCL 2 MG/2ML IJ SOLN
2.0000 mg | INTRAMUSCULAR | Status: DC | PRN
  Administered 2012-04-11: 1 mg via INTRAVENOUS

## 2012-04-11 MED ORDER — FENTANYL CITRATE 0.05 MG/ML IJ SOLN
100.0000 ug | INTRAMUSCULAR | Status: DC | PRN
  Administered 2012-04-11: 100 ug via INTRAVENOUS

## 2012-04-11 MED ORDER — BUPIVACAINE HCL (PF) 0.25 % IJ SOLN
INTRAMUSCULAR | Status: DC | PRN
  Administered 2012-04-11: 5 mL

## 2012-04-11 MED ORDER — DEXAMETHASONE SODIUM PHOSPHATE 10 MG/ML IJ SOLN
INTRAMUSCULAR | Status: DC | PRN
  Administered 2012-04-11: 10 mg via INTRAVENOUS

## 2012-04-11 MED ORDER — PROPOFOL 10 MG/ML IV EMUL
INTRAVENOUS | Status: DC | PRN
  Administered 2012-04-11: 160 mg via INTRAVENOUS

## 2012-04-11 MED ORDER — VANCOMYCIN HCL IN DEXTROSE 1-5 GM/200ML-% IV SOLN
1000.0000 mg | INTRAVENOUS | Status: AC
  Administered 2012-04-11: 1000 mg via INTRAVENOUS

## 2012-04-11 MED ORDER — LACTATED RINGERS IV SOLN
INTRAVENOUS | Status: DC
  Administered 2012-04-11: 12:00:00 via INTRAVENOUS

## 2012-04-11 MED ORDER — LIDOCAINE HCL (CARDIAC) 20 MG/ML IV SOLN
INTRAVENOUS | Status: DC | PRN
  Administered 2012-04-11: 50 mg via INTRAVENOUS

## 2012-04-11 MED ORDER — ONDANSETRON HCL 4 MG/2ML IJ SOLN
INTRAMUSCULAR | Status: DC | PRN
  Administered 2012-04-11: 4 mg via INTRAVENOUS

## 2012-04-11 MED ORDER — HYDROMORPHONE HCL PF 1 MG/ML IJ SOLN: 0.2500 mg | INTRAMUSCULAR | Status: DC | PRN

## 2012-04-11 MED ORDER — ROPIVACAINE HCL 5 MG/ML IJ SOLN
INTRAMUSCULAR | Status: DC | PRN
  Administered 2012-04-11: 35 mL via EPIDURAL

## 2012-04-11 SURGICAL SUPPLY — 61 items
BANDAGE ELASTIC 3 VELCRO ST LF (GAUZE/BANDAGES/DRESSINGS) IMPLANT
BANDAGE ELASTIC 4 VELCRO ST LF (GAUZE/BANDAGES/DRESSINGS) ×2 IMPLANT
BIT DRILL PROS QC 1.5 (BIT) ×1 IMPLANT
BLADE SURG 15 STRL LF DISP TIS (BLADE) ×1 IMPLANT
BLADE SURG 15 STRL SS (BLADE) ×2
BNDG CMPR 9X4 STRL LF SNTH (GAUZE/BANDAGES/DRESSINGS)
BNDG CMPR MD 5X2 ELC HKLP STRL (GAUZE/BANDAGES/DRESSINGS)
BNDG COHESIVE 3X5 TAN STRL LF (GAUZE/BANDAGES/DRESSINGS) ×2 IMPLANT
BNDG ELASTIC 2 VLCR STRL LF (GAUZE/BANDAGES/DRESSINGS) IMPLANT
BNDG ESMARK 4X9 LF (GAUZE/BANDAGES/DRESSINGS) IMPLANT
CLOTH BEACON ORANGE TIMEOUT ST (SAFETY) ×2 IMPLANT
COVER MAYO STAND STRL (DRAPES) ×2 IMPLANT
COVER TABLE BACK 60X90 (DRAPES) ×2 IMPLANT
CUFF TOURNIQUET SINGLE 18IN (TOURNIQUET CUFF) IMPLANT
DECANTER SPIKE VIAL GLASS SM (MISCELLANEOUS) IMPLANT
DRAPE C-ARM 42X72 X-RAY (DRAPES) IMPLANT
DRAPE EXTREMITY T 121X128X90 (DRAPE) ×2 IMPLANT
DRAPE OEC MINIVIEW 54X84 (DRAPES) ×2 IMPLANT
DURAPREP 26ML APPLICATOR (WOUND CARE) ×2 IMPLANT
ELECT NDL TIP 2.8 STRL (NEEDLE) ×1 IMPLANT
ELECT NEEDLE TIP 2.8 STRL (NEEDLE) IMPLANT
ELECT REM PT RETURN 9FT ADLT (ELECTROSURGICAL) ×2
ELECTRODE REM PT RTRN 9FT ADLT (ELECTROSURGICAL) ×1 IMPLANT
GAUZE XEROFORM 1X8 LF (GAUZE/BANDAGES/DRESSINGS) ×1 IMPLANT
GLOVE BIO SURGEON STRL SZ 6.5 (GLOVE) ×2 IMPLANT
GLOVE BIOGEL PI IND STRL 8 (GLOVE) ×1 IMPLANT
GLOVE BIOGEL PI INDICATOR 8 (GLOVE) ×1
GLOVE ORTHO TXT STRL SZ7.5 (GLOVE) ×4 IMPLANT
GOWN BRE IMP PREV XXLGXLNG (GOWN DISPOSABLE) ×2 IMPLANT
GOWN PREVENTION PLUS XLARGE (GOWN DISPOSABLE) ×2 IMPLANT
NDL HYPO 25X1 1.5 SAFETY (NEEDLE) IMPLANT
NEEDLE HYPO 22GX1.5 SAFETY (NEEDLE) IMPLANT
NEEDLE HYPO 25X1 1.5 SAFETY (NEEDLE) IMPLANT
PACK BASIN DAY SURGERY FS (CUSTOM PROCEDURE TRAY) ×2 IMPLANT
PAD CAST 3X4 CTTN HI CHSV (CAST SUPPLIES) ×2 IMPLANT
PADDING CAST ABS 4INX4YD NS (CAST SUPPLIES) ×1
PADDING CAST ABS COTTON 4X4 ST (CAST SUPPLIES) ×1 IMPLANT
PADDING CAST COTTON 3X4 STRL (CAST SUPPLIES) ×4
PADDING UNDERCAST 2  STERILE (CAST SUPPLIES) IMPLANT
PENCIL BUTTON HOLSTER BLD 10FT (ELECTRODE) ×2 IMPLANT
PLATE 1/4 TUB W/COL 5H (Plate) ×1 IMPLANT
SCREW CORTEX ST 2.0X10 (Screw) ×2 IMPLANT
SCREW CORTEX ST 2.0X12 (Screw) ×1 IMPLANT
SCREW CORTEX ST 2.0X8 (Screw) ×3 IMPLANT
SLEEVE SCD COMPRESS KNEE MED (MISCELLANEOUS) IMPLANT
SPLINT FAST PLASTER 5X30 (CAST SUPPLIES) ×10
SPLINT PLASTER CAST FAST 5X30 (CAST SUPPLIES) IMPLANT
SPONGE GAUZE 4X4 12PLY (GAUZE/BANDAGES/DRESSINGS) ×2 IMPLANT
STOCKINETTE 4X48 STRL (DRAPES) ×1 IMPLANT
SUT ETHILON 3 0 PS 1 (SUTURE) IMPLANT
SUT ETHILON 5 0 PS 2 18 (SUTURE) IMPLANT
SUT MON AB 4-0 PC3 18 (SUTURE) IMPLANT
SUT VIC AB 2-0 SH 27 (SUTURE)
SUT VIC AB 2-0 SH 27XBRD (SUTURE) IMPLANT
SUT VIC AB 3-0 FS2 27 (SUTURE) IMPLANT
SUT VIC AB 3-0 SH 27 (SUTURE)
SUT VIC AB 3-0 SH 27X BRD (SUTURE) IMPLANT
SYR CONTROL 10ML LL (SYRINGE) IMPLANT
TOWEL OR 17X24 6PK STRL BLUE (TOWEL DISPOSABLE) ×2 IMPLANT
UNDERPAD 30X30 INCONTINENT (UNDERPADS AND DIAPERS) ×2 IMPLANT
WATER STERILE IRR 1000ML POUR (IV SOLUTION) ×1 IMPLANT

## 2012-04-11 NOTE — Anesthesia Preprocedure Evaluation (Signed)
Anesthesia Evaluation  Patient identified by MRN, date of birth, ID band Patient awake    Reviewed: Allergy & Precautions, H&P , NPO status , Patient's Chart, lab work & pertinent test results  Airway Mallampati: II TM Distance: >3 FB Neck ROM: Full    Dental No notable dental hx. (+) Edentulous Upper   Pulmonary neg pulmonary ROS,  breath sounds clear to auscultation  Pulmonary exam normal       Cardiovascular hypertension, On Medications Rhythm:Regular Rate:Normal     Neuro/Psych negative neurological ROS  negative psych ROS   GI/Hepatic Neg liver ROS, hiatal hernia,   Endo/Other  negative endocrine ROSMorbid obesity  Renal/GU negative Renal ROS  negative genitourinary   Musculoskeletal   Abdominal (+) + obese,   Peds  Hematology negative hematology ROS (+)   Anesthesia Other Findings   Reproductive/Obstetrics negative OB ROS                           Anesthesia Physical Anesthesia Plan  ASA: III  Anesthesia Plan: General   Post-op Pain Management:    Induction: Intravenous  Airway Management Planned: LMA  Additional Equipment:   Intra-op Plan:   Post-operative Plan: Extubation in OR  Informed Consent: I have reviewed the patients History and Physical, chart, labs and discussed the procedure including the risks, benefits and alternatives for the proposed anesthesia with the patient or authorized representative who has indicated his/her understanding and acceptance.     Plan Discussed with: CRNA  Anesthesia Plan Comments:         Anesthesia Quick Evaluation

## 2012-04-11 NOTE — Progress Notes (Signed)
Assisted Dr. Fitzgerald with left, ultrasound guided, supraclavicular block. Side rails up, monitors on throughout procedure. See vital signs in flow sheet. Tolerated Procedure well. 

## 2012-04-11 NOTE — Interval H&P Note (Signed)
History and Physical Interval Note:  04/11/2012 12:45 PM  [name]  has presented today for surgery, with the diagnosis of left hand metacarpal fracture  The various methods of treatment have been discussed with the patient and family. After consideration of risks, benefits and other options for treatment, the patient has consented to  Procedure(s) (LRB): OPEN REDUCTION INTERNAL FIXATION (ORIF) METACARPAL (FINGER) FRACTURE (Left) as a surgical intervention .  The patients' history has been reviewed, patient examined, no change in status, stable for surgery.  I have reviewed the patients' chart and labs.  Questions were answered to the patient's satisfaction.     [name]

## 2012-04-11 NOTE — H&P (Signed)
66 year old female with displaced 4th metacarpel fracture left hand from a fall. Entire history and physical documented in [place] visit and reviewed by me. Otherwise healthy. Heart RRR, lungs clear, closed injury left hand and neurovascularly intact. Diagnosis and all treatment options outlined. All questions answered. Proceed with ORIF 4th metacarpel fracture on left today

## 2012-04-11 NOTE — Anesthesia Postprocedure Evaluation (Signed)
Anesthesia Post Note  Patient: [name]  Procedure(s) Performed: Procedure(s) (LRB): OPEN REDUCTION INTERNAL FIXATION (ORIF) METACARPAL (FINGER) FRACTURE (Left)  Anesthesia type: General  Patient location: PACU  Post pain: Pain level controlled  Post assessment: Patient's Cardiovascular Status Stable  Last Vitals:  Filed Vitals:   04/11/12 1546  BP:   Pulse: 87  Temp:   Resp: 14    Post vital signs: Reviewed and stable  Level of consciousness: alert  Complications: No apparent anesthesia complications

## 2012-04-11 NOTE — Anesthesia Procedure Notes (Addendum)
Anesthesia Regional Block:  Supraclavicular block  Pre-Anesthetic Checklist: ,, timeout performed, Correct Patient, Correct Site, Correct Laterality, Correct Procedure, Correct Position, site marked, Risks and benefits discussed, pre-op evaluation, post-op pain management  Laterality: Left  Prep: Maximum Sterile Barrier Precautions used and chloraprep       Needles:  Injection technique: Single-shot  Needle Type: Echogenic Stimulator Needle          Additional Needles:  Procedures: ultrasound guided and nerve stimulator Supraclavicular block Narrative:  Start time: 04/11/2012 12:25 PM End time: 04/11/2012 12:41 PM Injection made incrementally with aspirations every 5 mL. Anesthesiologist: Fitzgerald,MD  Additional Notes: 2% Lidocaine skin wheel. Intercostobrachial block with 8cc of 0.5% Ropivicaine plain.  Supraclavicular block Procedure Name: LMA Insertion Date/Time: 04/11/2012 2:27 PM Performed by: Gar Gibbon Pre-anesthesia Checklist: Patient identified, Emergency Drugs available, Suction available and Patient being monitored Patient Re-evaluated:Patient Re-evaluated prior to inductionOxygen Delivery Method: Circle System Utilized Preoxygenation: Pre-oxygenation with 100% oxygen Intubation Type: IV induction Ventilation: Mask ventilation without difficulty LMA: LMA with gastric port inserted LMA Size: 4.0 Number of attempts: 1 Placement Confirmation: positive ETCO2 Tube secured with: Tape Dental Injury: Teeth and Oropharynx as per pre-operative assessment

## 2012-04-11 NOTE — Transfer of Care (Signed)
Immediate Anesthesia Transfer of Care Note  Patient: [name]  Procedure(s) Performed: Procedure(s) (LRB): OPEN REDUCTION INTERNAL FIXATION (ORIF) METACARPAL (FINGER) FRACTURE (Left)  Patient Location: PACU  Anesthesia Type: GA combined with regional for post-op pain  Level of Consciousness: awake and oriented  Airway & Oxygen Therapy: Patient Spontanous Breathing and Patient connected to face mask oxygen  Post-op Assessment: Report given to PACU RN and Post -op Vital signs reviewed and stable  Post vital signs: Reviewed and stable  Complications: No apparent anesthesia complications

## 2012-04-11 NOTE — Discharge Instructions (Signed)
°  Post Anesthesia Home Care Instructions ° °Activity: °Get plenty of rest for the remainder of the day. A responsible adult should stay with you for 24 hours following the procedure.  °For the next 24 hours, DO NOT: °-Drive a car °-Operate machinery °-Drink alcoholic beverages °-Take any medication unless instructed by your physician °-Make any legal decisions or sign important papers. ° °Meals: °Start with liquid foods such as gelatin or soup. Progress to regular foods as tolerated. Avoid greasy, spicy, heavy foods. If nausea and/or vomiting occur, drink only clear liquids until the nausea and/or vomiting subsides. Call your physician if vomiting continues. ° °Special Instructions/Symptoms: °Your throat may feel dry or sore from the anesthesia or the breathing tube placed in your throat during surgery. If this causes discomfort, gargle with warm salt water. The discomfort should disappear within 24 hours. ° °Post Anesthesia Home Care Instructions ° °Activity: °Get plenty of rest for the remainder of the day. A responsible adult should stay with you for 24 hours following the procedure.  °For the next 24 hours, DO NOT: °-Drive a car °-Operate machinery °-Drink alcoholic beverages °-Take any medication unless instructed by your physician °-Make any legal decisions or sign important papers. ° °Meals: °Start with liquid foods such as gelatin or soup. Progress to regular foods as tolerated. Avoid greasy, spicy, heavy foods. If nausea and/or vomiting occur, drink only clear liquids until the nausea and/or vomiting subsides. Call your physician if vomiting continues. ° °Special Instructions/Symptoms: °Your throat may feel dry or sore from the anesthesia or the breathing tube placed in your throat during surgery. If this causes discomfort, gargle with warm salt water. The discomfort should disappear within 24 hours. °Regional Anesthesia Blocks ° °1. Numbness or the inability to move the "blocked" extremity may last from  3-48 hours after placement. The length of time depends on the medication injected and your individual response to the medication. If the numbness is not going away after 48 hours, call your surgeon. ° °2. The extremity that is blocked will need to be protected until the numbness is gone and the  Strength has returned. Because you cannot feel it, you will need to take extra care to avoid injury. Because it may be weak, you may have difficulty moving it or using it. You may not know what position it is in without looking at it while the block is in effect. ° °3. For blocks in the legs and feet, returning to weight bearing and walking needs to be done carefully. You will need to wait until the numbness is entirely gone and the strength has returned. You should be able to move your leg and foot normally before you try and bear weight or walk. You will need someone to be with you when you first try to ensure you do not fall and possibly risk injury. ° °4. Bruising and tenderness at the needle site are common side effects and will resolve in a few days. ° °5. Persistent numbness or new problems with movement should be communicated to the surgeon or the [facility] ([phone])/ [facility] ([phone]). °

## 2012-04-11 NOTE — Brief Op Note (Signed)
04/11/2012  3:40 PM  PATIENT:  [name]  66 y.o. female  PRE-OPERATIVE DIAGNOSIS:  left hand metacarpal fracture  POST-OPERATIVE DIAGNOSIS:  left hand 4th metacarpal fracture  PROCEDURE:  Procedure(s) (LRB): OPEN REDUCTION INTERNAL FIXATION (ORIF)  4th METACARPAL (FINGER) FRACTURE (Left)  SURGEON:  Surgeon(s) and Role:    * Loreta Ave, MD - Primary  PHYSICIAN ASSISTANT: Zonia Kief M    ANESTHESIA:   regional and general  EBL:  Total I/O In: 800 [I.V.:800] Out: -   BLOOD ADMINISTERED:none  COUNTS:  YES  TOURNIQUET:   Total Tourniquet Time Documented: area (laterality) - 34 minutes   PATIENT DISPOSITION:  PACU - hemodynamically stable.

## 2012-04-12 NOTE — Op Note (Signed)
NAME:  [name], [name]                      ACCOUNT NO.:  MEDICAL RECORD NO.:  [id number]  LOCATION:                                 FACILITY:  PHYSICIAN:  Loreta Ave, M.D. DATE OF BIRTH:  November 25, 1946  DATE OF PROCEDURE:  04/11/2012 DATE OF DISCHARGE:                              OPERATIVE REPORT   PREOPERATIVE DIAGNOSIS:  Left hand closed, displaced, short oblique fracture; fourth metacarpal.  POSTOPERATIVE DIAGNOSE:  Left hand closed, displaced, short oblique fracture; fourth metacarpal.  PROCEDURE:  Open reduction and internal fixation, fourth metacarpal fracture, left hand utilizing a dorsal plate from the mini-fragment set and five 2.0-mm screws.  SURGEON:  Loreta Ave, MD  ASSISTANT:  Genene Churn. Barry Dienes, [place]  ANESTHESIA:  General.  BLOOD LOSS:  Minimal.  SPECIMENS:  None.  CULTURES:  None.  COMPLICATION:  None.  DRESSINGS:  Sterile compressive with a bulky hand dressing and splint.  TOURNIQUET TIME:  30 minutes.  PROCEDURE:  The patient brought to the operating room, placed on the operating table in supine position.  After adequate anesthesia had been obtained, tourniquet applied and prepped and draped in usual sterile fashion.  Exsanguinated with elevation and Esmarch.  Tourniquet inflated to 250 mmHg.  Fluoroscopic guidance throughout.  Longitudinal incision over the fractured fourth metacarpal.  Skin and subcutaneous tissues divided.  A little fraying of the extensor tendon was all over the fracture but longitudinally intact.  Subperiosteal exposure of the fracture itself.  Debris cleared out.  Reduced anatomically.  Fixed with a dorsal 5-hole plate with 2 screws proximal, 2 distal, and 1 traversing the fracture overdrilled for lag effect.  At completion, nice solid stable fixation and anatomic alignment confirmed visually and fluoroscopically.  Wound irrigated. Closed with nylon.  Sterile compressive dressing applied.  Bulky hand dressing and splint.   Tourniquet deflated and removed.  Anesthesia reversed.  Brought to recovery room.  Tolerated surgery well.  No complications.     Loreta Ave, M.D.     DFM/MEDQ  D:  04/11/2012  T:  04/12/2012  Job:  [id number]

## 2012-04-17 ENCOUNTER — Encounter (HOSPITAL_BASED_OUTPATIENT_CLINIC_OR_DEPARTMENT_OTHER): Payer: Self-pay | Admitting: Orthopedic Surgery

## 2012-09-30 ENCOUNTER — Encounter: Payer: Self-pay | Admitting: Gastroenterology

## 2012-11-04 ENCOUNTER — Ambulatory Visit (INDEPENDENT_AMBULATORY_CARE_PROVIDER_SITE_OTHER): Payer: Medicare Other | Admitting: Gastroenterology

## 2012-11-04 ENCOUNTER — Encounter: Payer: Self-pay | Admitting: Gastroenterology

## 2012-11-04 VITALS — BP 130/82 | HR 64 | Ht 59.25 in | Wt 233.2 lb

## 2012-11-04 DIAGNOSIS — R079 Chest pain, unspecified: Secondary | ICD-10-CM

## 2012-11-04 NOTE — Patient Instructions (Addendum)
You will be set up for an upper endoscopy for chest pains (LEC moderate sedation). Recall colonoscopy March 2016 for routine colon cancer screening. A copy of this information will be made available to Dr. Nadara Eaton.

## 2012-11-04 NOTE — Progress Notes (Signed)
HPI: This is a    very pleasant 66 year old woman whom I am meeting for the first time today. She is here with her husband who is also a patient of mine.  Was sent by cardiologist, Dr. Nadara Eaton.  She has been having chest pains.  She underwent echo, stress test.   The chest pains can be "real bad."  She is on cpap.  The pains usually happen at night while on CPAP.  The pain is pressure like.  Can radiate to left side. Can be associated with exercise but also just while laying.  The CPAP is relatively new, about 2 years ago.   The pains have been 4-5 months.  Maybe slight short.  Eats dinner, 6 pm, usually snacks before bedtime.  Drinks caffeine only in AM  Non smoker, non etoch.  Dr. Nadara Eaton put her on antiacid medicine, omeprazole takaing it daily for 1-2 months. This has made no difference.  Overall stable weight.  No nausea or vomiting.  Potassium seems to go down on left side of throat only.  No colon cancer in her family. No changes in her bowels.  No nsaids.  Colonoscopy November 2004 by Dr. Virginia Rochester; indications Hemoccult-positive stool, findings normal colon Colonoscopy March 2006 Dr. Virginia Rochester; indications "question of polyps" findings normal colon, no polyps  EGD 2000 Dr. Virginia Rochester; report not available but biopsies showed no Barrett's EGD 2004, Dr. Virginia Rochester; indications Hemoccult-positive stool, findings erythema in stomach, biopsies showed no significant inflammation or infection, no Barrett's  Usually takes 1-2 percocets a day. VEry unusual to take more.     Review of systems: Pertinent positive and negative review of systems were noted in the above HPI section. Complete review of systems was performed and was otherwise normal.    Past Medical History  Diagnosis Date  . Hypertension   . H/O hiatal hernia   . Depression   . Chronic back pain     Past Surgical History  Procedure Date  . Abdominal hysterectomy   . Cholecystectomy   . Tonsillectomy   . Rectocele repair   .  Pituitary surgery   . Colonoscopy   . Upper gastrointestinal endoscopy   . Carpal tunnel release     right and lt  . Fracture surgery     rt hand  . Fracture surgery     lr wrist  . Orif finger fracture 04/11/2012    Procedure: OPEN REDUCTION INTERNAL FIXATION (ORIF) METACARPAL (FINGER) FRACTURE;  Surgeon: Loreta Ave, MD;  Location: [facility];  Service: Orthopedics;  Laterality: Left;  orif left hand 4th metacarpal     Current Outpatient Prescriptions  Medication Sig Dispense Refill  . aspirin 81 MG tablet Take 81 mg by mouth daily.      Marland Kitchen buPROPion (WELLBUTRIN SR) 150 MG 12 hr tablet Take 150 mg by mouth daily.      . cholecalciferol (VITAMIN D) 1000 UNITS tablet Take 2,000 Units by mouth daily.      . citalopram (CELEXA) 40 MG tablet Take 40 mg by mouth daily.      . fluticasone (FLONASE) 50 MCG/ACT nasal spray Place 2 sprays into the nose daily.      Marland Kitchen levothyroxine (SYNTHROID, LEVOTHROID) 88 MCG tablet Take 88 mcg by mouth daily.      Marland Kitchen loratadine (CLARITIN) 10 MG tablet Take 10 mg by mouth daily.      . Multiple Vitamins-Minerals (WOMENS MULTIVITAMIN PLUS PO) Take 1 tablet by mouth daily.      Marland Kitchen  oxyCODONE-acetaminophen (PERCOCET/ROXICET) 5-325 MG per tablet Take 2 tablets by mouth every 4 (four) hours as needed.      . potassium chloride SA (K-DUR,KLOR-CON) 20 MEQ tablet Take 20 mEq by mouth 2 (two) times daily.      . pravastatin (PRAVACHOL) 20 MG tablet Take 20 mg by mouth daily.      . traZODone (DESYREL) 50 MG tablet Take 50 mg by mouth at bedtime.      . valsartan-hydrochlorothiazide (DIOVAN-HCT) 80-12.5 MG per tablet Take 1 tablet by mouth daily.      . vitamin B-12 (CYANOCOBALAMIN) 1000 MCG tablet Take 1,000 mcg by mouth once a week.      . vitamin E 400 UNIT capsule Take 400 Units by mouth daily.        Allergies as of 11/04/2012 - Review Complete 11/04/2012  Allergen Reaction Noted  . Penicillins Itching 04/06/2012  . Sulfa antibiotics Swelling  04/06/2012    Family History  Problem Relation Age of Onset  . Hypertension Mother   . Diabetes Mother   . Cancer Maternal Grandfather     back  . Cancer Paternal Uncle   . Lung cancer Paternal Aunt   . Lung cancer Sister   . Heart attack Brother     x 2    History   Social History  . Marital Status: Married    Spouse Name: N/A    Number of Children: 2  . Years of Education: N/A   Occupational History  . Not on file.   Social History Main Topics  . Smoking status: Never Smoker   . Smokeless tobacco: Never Used  . Alcohol Use: No  . Drug Use: No  . Sexually Active: Not on file   Other Topics Concern  . Not on file   Social History Narrative  . No narrative on file       Physical Exam: There were no vitals taken for this visit. Constitutional: generally well-appearing except for obesity Psychiatric: alert and oriented x3 Eyes: extraocular movements intact Mouth: oral pharynx moist, no lesions Neck: supple no lymphadenopathy Cardiovascular: heart regular rate and rhythm Lungs: clear to auscultation bilaterally Abdomen: soft, nontender, nondistended, no obvious ascites, no peritoneal signs, normal bowel sounds Extremities: no lower extremity edema bilaterally Skin: no lesions on visible extremities    Assessment and plan: 66 y.o. female with  obesity, imaging chest pains  It is interesting that her chest pains are usually at night while on CPAP, laying down. Possibly these are GERD related however she has no other more classic symptoms recurred such as pyrosis. I have seen people have a variety of discomforts related to CPAP. I suspect the mechanism of this is via aire introduced into the GI tract from CPAP.  I think it is reasonable to perform EGD to check for other causes of chest pain such as significant esophagitis, prep she has a hiatal hernia however was not described on previous EEGs, perhaps gastritis.  Her last  colonoscopy was in 2006 and she does  not require another one for 10 year interval for routine screening and we will put her in our reminder system for that.

## 2012-11-08 ENCOUNTER — Ambulatory Visit (AMBULATORY_SURGERY_CENTER): Payer: Medicare Other | Admitting: Gastroenterology

## 2012-11-08 ENCOUNTER — Encounter: Payer: Self-pay | Admitting: Gastroenterology

## 2012-11-08 VITALS — BP 111/71 | HR 78 | Temp 97.9°F | Resp 16 | Ht 59.0 in | Wt 233.0 lb

## 2012-11-08 DIAGNOSIS — K319 Disease of stomach and duodenum, unspecified: Secondary | ICD-10-CM

## 2012-11-08 DIAGNOSIS — R079 Chest pain, unspecified: Secondary | ICD-10-CM

## 2012-11-08 DIAGNOSIS — K2970 Gastritis, unspecified, without bleeding: Secondary | ICD-10-CM

## 2012-11-08 MED ORDER — SODIUM CHLORIDE 0.9 % IV SOLN: 500.0000 mL | INTRAVENOUS | Status: DC

## 2012-11-08 NOTE — Op Note (Signed)
[facility] [address] [place], [address]   ENDOSCOPY PROCEDURE REPORT  PATIENT: Diana, Hayes  MR#: [id number] BIRTHDATE: Mar 08, 1946 , 66  yrs. old GENDER: Female ENDOSCOPIST: Rachael Fee, MD REFERRED BY:  Chilton Greathouse, M.D. PROCEDURE DATE:  11/08/2012 PROCEDURE:  EGD w/ biopsy ASA CLASS:     Class III INDICATIONS:  dyspepsia, especially at night. MEDICATIONS: Fentanyl 50 mcg IV, Versed 4 mg IV, and These medications were titrated to patient response per physician's verbal order TOPICAL ANESTHETIC: Cetacaine Spray  DESCRIPTION OF PROCEDURE: After the risks benefits and alternatives of the procedure were thoroughly explained, informed consent was obtained.  The LB-GIF Q180 [id number] endoscope was introduced through the mouth and advanced to the second portion of the duodenum. Without limitations.  The instrument was slowly withdrawn as the mucosa was fully examined.    There was a single erosion at GE junction, appeared reflux related (minor esophagitis).  There was moderate pan-gastritis, most signficant at GE junction.  This was biopsied and sent to pathology.  The examination was otherwise normal.  Retroflexed views revealed no abnormalities.     The scope was then withdrawn from the patient and the procedure completed. COMPLICATIONS: There were no complications.  ENDOSCOPIC IMPRESSION: Mild esophagitis, reflux related. There was moderate pan-gastritis, biopsied to check for H. pylori The examination was otherwise normal.  RECOMMENDATIONS: Await final pathology.  If biopsies show H.  pylori, you will be started on appropriate antibiotics.  If not, then will likely recommend that you start a single H2 block antiacid medicine, nightly at bedtime.   eSigned:  Rachael Fee, MD 11/08/2012 10:05 AM

## 2012-11-08 NOTE — Patient Instructions (Addendum)
YOU HAD AN ENDOSCOPIC PROCEDURE TODAY AT THE [facility]: Refer to the procedure report that was given to you for any specific questions about what was found during the examination.  If the procedure report does not answer your questions, please call your gastroenterologist to clarify.  If you requested that your care partner not be given the details of your procedure findings, then the procedure report has been included in a sealed envelope for you to review at your convenience later.  YOU SHOULD EXPECT: Some feelings of bloating in the abdomen. Passage of more gas than usual.  Walking can help get rid of the air that was put into your GI tract during the procedure and reduce the bloating. DIET: Your first meal following the procedure should be a light meal and then it is ok to progress to your normal diet.  A half-sandwich or bowl of soup is an example of a good first meal.  Heavy or fried foods are harder to digest and may make you feel nauseous or bloated.  Likewise meals heavy in dairy and vegetables can cause extra gas to form and this can also increase the bloating.  Drink plenty of fluids but you should avoid alcoholic beverages for 24 hours.  ACTIVITY: Your care partner should take you home directly after the procedure.  You should plan to take it easy, moving slowly for the rest of the day.  You can resume normal activity the day after the procedure however you should NOT DRIVE or use heavy machinery for 24 hours (because of the sedation medicines used during the test).    SYMPTOMS TO REPORT IMMEDIATELY: A gastroenterologist can be reached at any hour.  During normal business hours, 8:30 AM to 5:00 PM Monday through Friday, call [phone].  After hours and on weekends, please call the GI answering service at [phone] who will take a message and have the physician on call contact you.   Following upper endoscopy (EGD)  Vomiting of blood or coffee ground material  New  chest pain or pain under the shoulder blades  Painful or persistently difficult swallowing  New shortness of breath  Fever of 100F or higher  Black, tarry-looking stools  FOLLOW UP: If any biopsies were taken you will be contacted by phone or by letter within the next 1-3 weeks.  Call your gastroenterologist if you have not heard about the biopsies in 3 weeks.  Our staff will call the home number listed on your records the next business day following your procedure to check on you and address any questions or concerns that you may have at that time regarding the information given to you following your procedure. This is a courtesy call and so if there is no answer at the home number and we have not heard from you through the emergency physician on call, we will assume that you have returned to your regular daily activities without incident.  SIGNATURES/CONFIDENTIALITY: You and/or your care partner have signed paperwork which will be entered into your electronic medical record.  These signatures attest to the fact that that the information above on your After Visit Summary has been reviewed and is understood.  Full responsibility of the confidentiality of this discharge information lies with you and/or your care-partner. 

## 2012-11-08 NOTE — Progress Notes (Addendum)
Patient did not have preoperative order for IV antibiotic SSI prophylaxis. (G8918)  Patient did not experience any of the following events: a burn prior to discharge; a fall within the facility; wrong site/side/patient/procedure/implant event; or a hospital transfer or hospital admission upon discharge from the facility. (G8907)  

## 2012-11-11 ENCOUNTER — Telehealth: Payer: Self-pay | Admitting: *Deleted

## 2012-11-11 NOTE — Telephone Encounter (Signed)
  Follow up Call-  Call back number 11/08/2012  Post procedure Call Back phone  # [phone]  Permission to leave phone message Yes     Patient questions:  Do you have a fever, pain , or abdominal swelling? no Pain Score  0 *  Have you tolerated food without any problems? yes  Have you been able to return to your normal activities? yes  Do you have any questions about your discharge instructions: Diet   no Medications  no Follow up visit  no  Do you have questions or concerns about your Care? no  Actions: * If pain score is 4 or above: No action needed, pain <4.  Patient c/o "stomach feeling full,comes and goes",she denies any pain or swelling. States tolerated food well. Denies any problems. Patient to call us back if needed.

## 2013-01-07 ENCOUNTER — Encounter: Payer: Self-pay | Admitting: Gastroenterology

## 2013-01-07 ENCOUNTER — Ambulatory Visit (INDEPENDENT_AMBULATORY_CARE_PROVIDER_SITE_OTHER): Payer: Medicare Other | Admitting: Gastroenterology

## 2013-01-07 VITALS — BP 118/72 | HR 70 | Wt 227.0 lb

## 2013-01-07 DIAGNOSIS — R079 Chest pain, unspecified: Secondary | ICD-10-CM

## 2013-01-07 DIAGNOSIS — K219 Gastro-esophageal reflux disease without esophagitis: Secondary | ICD-10-CM

## 2013-01-07 MED ORDER — OMEPRAZOLE 40 MG PO CPDR: 40.0000 mg | DELAYED_RELEASE_CAPSULE | Freq: Every day | ORAL | Status: AC

## 2013-01-07 NOTE — Progress Notes (Signed)
Review of pertinent gastrointestinal problems: 1. Routine risk for CRC: Colonoscopy November 2004 by Dr. Virginia Rochester; indications Hemoccult-positive stool, findings normal colon. Colonoscopy March 2006 Dr. Virginia Rochester; indications "question of polyps" findings normal colon, no polyps.  Recall colonoscopy March 2016.  2. Chest pains "non cardiac"  EGD 10/2012 found moderate H. Pylori neg gastritis, mild esophagitis.  Recommended nightly H2  Blocker. 3. EGD 2000 Dr. Virginia Rochester; report not available but biopsies showed no Barrett's.  EGD 2004, Dr. Virginia Rochester; indications Hemoccult-positive stool, findings erythema in stomach, biopsies showed no significant inflammation or infection, no Barrett's   HPI: This is a very pleasant 67 year old woman who is here with her husband today. I last saw her about 2 months ago at the time of upper endoscopy.    She is feeling better.  "Less chest pain."  Takes prilosec qam before BF and bedtime H2 blocker.  She is not having as much chest pain.  Pretty rare nsaids.    Past Medical History  Diagnosis Date  . Hypertension   . H/O hiatal hernia   . Depression   . Chronic back pain   . Thyroid disease     Past Surgical History  Procedure Date  . Abdominal hysterectomy   . Cholecystectomy   . Tonsillectomy   . Rectocele repair   . Pituitary surgery   . Colonoscopy   . Upper gastrointestinal endoscopy   . Carpal tunnel release     right and lt  . Fracture surgery     rt hand  . Fracture surgery     lr wrist  . Orif finger fracture 04/11/2012    Procedure: OPEN REDUCTION INTERNAL FIXATION (ORIF) METACARPAL (FINGER) FRACTURE;  Surgeon: Loreta Ave, MD;  Location: [facility];  Service: Orthopedics;  Laterality: Left;  orif left hand 4th metacarpal     Current Outpatient Prescriptions  Medication Sig Dispense Refill  . aspirin 81 MG tablet Take 81 mg by mouth daily.      Marland Kitchen buPROPion (WELLBUTRIN SR) 150 MG 12 hr tablet Take 150 mg by mouth daily.      .  cholecalciferol (VITAMIN D) 1000 UNITS tablet Take 2,000 Units by mouth daily.      . citalopram (CELEXA) 40 MG tablet Take 40 mg by mouth daily.      . fluticasone (FLONASE) 50 MCG/ACT nasal spray Place 2 sprays into the nose daily.      Marland Kitchen HYDROcodone-acetaminophen (NORCO/VICODIN) 5-325 MG per tablet       . levothyroxine (SYNTHROID, LEVOTHROID) 88 MCG tablet Take 88 mcg by mouth daily.      Marland Kitchen loratadine (CLARITIN) 10 MG tablet Take 10 mg by mouth daily.      . Multiple Vitamins-Minerals (WOMENS MULTIVITAMIN PLUS PO) Take 1 tablet by mouth daily.      Marland Kitchen omeprazole (PRILOSEC) 40 MG capsule       . potassium chloride SA (K-DUR,KLOR-CON) 20 MEQ tablet Take 20 mEq by mouth 2 (two) times daily.      . pravastatin (PRAVACHOL) 20 MG tablet Take 20 mg by mouth daily.      . traZODone (DESYREL) 50 MG tablet Take 50 mg by mouth at bedtime.      . valsartan-hydrochlorothiazide (DIOVAN-HCT) 80-12.5 MG per tablet Take 1 tablet by mouth daily.      . vitamin B-12 (CYANOCOBALAMIN) 1000 MCG tablet Take 1,000 mcg by mouth once a week.      . vitamin E 400 UNIT capsule Take 400 Units by  mouth daily.        Allergies as of 01/07/2013 - Review Complete 01/07/2013  Allergen Reaction Noted  . Penicillins Itching 04/06/2012  . Sulfa antibiotics Swelling 04/06/2012    Family History  Problem Relation Age of Onset  . Hypertension Mother   . Diabetes Mother   . Cancer Maternal Grandfather     back  . Cancer Paternal Uncle   . Lung cancer Paternal Aunt   . Lung cancer Sister   . Heart attack Brother     x 2    History   Social History  . Marital Status: Married    Spouse Name: N/A    Number of Children: 2  . Years of Education: N/A   Occupational History  . Not on file.   Social History Main Topics  . Smoking status: Never Smoker   . Smokeless tobacco: Never Used  . Alcohol Use: No  . Drug Use: No  . Sexually Active: Not on file   Other Topics Concern  . Not on file   Social History  Narrative  . No narrative on file      Physical Exam: BP 118/72  Pulse 70  Wt 227 lb (102.967 kg) Constitutional: generally well-appearing Psychiatric: alert and oriented x3 Abdomen: soft, nontender, nondistended, no obvious ascites, no peritoneal signs, normal bowel sounds     Assessment and plan: 67 y.o. female with mild gastritis, GERD, chest pain symptoms  Her symptoms have improved with morning proton pump inhibitor and bedtime H2 blocker. She feels quite well. She is to follow up on as-needed basis and we have her in our recall system for repeat colonoscopy in about 2 years.

## 2013-01-07 NOTE — Patient Instructions (Addendum)
You should continue daily prilosec (in AM, 20-30 min before BF meal).  New script  Called in. Bedtime H2 blocker. Recall colonoscopy March 2016 for routine colon cancer screening.

## 2013-02-22 HISTORY — PX: UPPER GASTROINTESTINAL ENDOSCOPY: SHX188

## 2013-03-06 ENCOUNTER — Telehealth: Payer: Self-pay | Admitting: Gastroenterology

## 2013-03-06 NOTE — Telephone Encounter (Signed)
Pt has been scheduled for 03/07/13 with Amy 830 am  Malachi Bonds will call the pt

## 2013-03-07 ENCOUNTER — Other Ambulatory Visit (INDEPENDENT_AMBULATORY_CARE_PROVIDER_SITE_OTHER): Payer: Medicare Other

## 2013-03-07 ENCOUNTER — Ambulatory Visit (INDEPENDENT_AMBULATORY_CARE_PROVIDER_SITE_OTHER): Payer: Medicare Other | Admitting: Physician Assistant

## 2013-03-07 ENCOUNTER — Encounter: Payer: Self-pay | Admitting: Physician Assistant

## 2013-03-07 ENCOUNTER — Telehealth: Payer: Self-pay | Admitting: Physician Assistant

## 2013-03-07 VITALS — BP 140/70 | HR 66 | Ht 59.5 in | Wt 235.4 lb

## 2013-03-07 DIAGNOSIS — R197 Diarrhea, unspecified: Secondary | ICD-10-CM

## 2013-03-07 DIAGNOSIS — Z9049 Acquired absence of other specified parts of digestive tract: Secondary | ICD-10-CM | POA: Insufficient documentation

## 2013-03-07 DIAGNOSIS — Z9089 Acquired absence of other organs: Secondary | ICD-10-CM

## 2013-03-07 DIAGNOSIS — G4733 Obstructive sleep apnea (adult) (pediatric): Secondary | ICD-10-CM

## 2013-03-07 DIAGNOSIS — Z8719 Personal history of other diseases of the digestive system: Secondary | ICD-10-CM

## 2013-03-07 DIAGNOSIS — R195 Other fecal abnormalities: Secondary | ICD-10-CM

## 2013-03-07 DIAGNOSIS — IMO0001 Reserved for inherently not codable concepts without codable children: Secondary | ICD-10-CM

## 2013-03-07 DIAGNOSIS — M797 Fibromyalgia: Secondary | ICD-10-CM

## 2013-03-07 DIAGNOSIS — E785 Hyperlipidemia, unspecified: Secondary | ICD-10-CM | POA: Insufficient documentation

## 2013-03-07 LAB — IBC PANEL
Iron: 50 ug/dL (ref 42–145)
Saturation Ratios: 11.7 % — ABNORMAL LOW (ref 20.0–50.0)
Transferrin: 306.2 mg/dL (ref 212.0–360.0)

## 2013-03-07 LAB — FERRITIN: Ferritin: 80.7 ng/mL (ref 10.0–291.0)

## 2013-03-07 MED ORDER — CHOLESTYRAMINE 4 G PO PACK: PACK | ORAL | Status: DC

## 2013-03-07 MED ORDER — MOVIPREP 100 G PO SOLR: 1.0000 | Freq: Once | ORAL | Status: DC

## 2013-03-07 NOTE — Progress Notes (Signed)
Subjective:    Patient ID: [name], female    DOB: 04/07/1946, 67 y.o.   MRN: [id number]  HPI  [name] is a very nice 67 year old white female known to Dr. Wendall Papa is referred today by Dr. Felipa Eth for evaluation of Hemoccult-positive stool. She has just undergone annual physical and hemocult was positive. CBC was also done and shows a hemoglobin of 14.5 hematocrit of 41.6 MCV of 90. Patient had an upper endoscopy done recently November of 2013 for noncardiac chest pain and was found to have moderate gastritis as well as H. pylori for which she has been treated. She has not had colonoscopy since 2008 with Dr. Virginia Rochester in this was a normal exam. And has no current complaints of heartburn indigestion dysphagia nausea or vomiting. She has been having some right lower quadrant pain which she describes as crampy and some urgency for bowel movements. He says the symptoms have been going on for several months. She also has some chronic problems with diarrhea since having her gallbladder taken out many years ago. She says she averages 3-4 bowel movements per day sometimes loose sometimes formed and that she has seen some dark stools off and on recently. She has also been on iron long-term for prior history of iron deficiency anemia. A do not see any recent iron studies. Family history is negative for colon cancer polyps on the she's not on any regular NSAID status take a baby aspirin daily    Review of Systems  Constitutional: Negative.   HENT: Negative.   Eyes: Negative.   Respiratory: Negative.   Cardiovascular: Negative.   Gastrointestinal: Positive for diarrhea.  Endocrine: Negative.   Genitourinary: Negative.   Allergic/Immunologic: Negative.   Neurological: Negative.   Hematological: Negative.   Psychiatric/Behavioral: Negative.    Outpatient Prescriptions Prior to Visit  Medication Sig Dispense Refill  . aspirin 81 MG tablet Take 81 mg by mouth daily.      Marland Kitchen buPROPion (WELLBUTRIN SR) 150 MG  12 hr tablet Take 150 mg by mouth daily.      . cholecalciferol (VITAMIN D) 1000 UNITS tablet Take 1,000 Units by mouth daily.       . citalopram (CELEXA) 40 MG tablet Take 40 mg by mouth daily.      . fluticasone (FLONASE) 50 MCG/ACT nasal spray Place 2 sprays into the nose daily.      Marland Kitchen HYDROcodone-acetaminophen (NORCO/VICODIN) 5-325 MG per tablet       . levothyroxine (SYNTHROID, LEVOTHROID) 88 MCG tablet Take 88 mcg by mouth daily.      Marland Kitchen loratadine (CLARITIN) 10 MG tablet Take 10 mg by mouth daily.      . Multiple Vitamins-Minerals (WOMENS MULTIVITAMIN PLUS PO) Take 1 tablet by mouth daily.      Marland Kitchen omeprazole (PRILOSEC) 40 MG capsule Take 1 capsule (40 mg total) by mouth daily before breakfast.  30 capsule  11  . potassium chloride SA (K-DUR,KLOR-CON) 20 MEQ tablet Take 20 mEq by mouth 2 (two) times daily.      . pravastatin (PRAVACHOL) 20 MG tablet Take 20 mg by mouth daily.      . traZODone (DESYREL) 50 MG tablet Take 50 mg by mouth at bedtime.      . valsartan-hydrochlorothiazide (DIOVAN-HCT) 80-12.5 MG per tablet Take 1 tablet by mouth daily.      . vitamin B-12 (CYANOCOBALAMIN) 1000 MCG tablet Take 1,000 mcg by mouth 2 (two) times a week.       . vitamin  E 400 UNIT capsule Take 400 Units by mouth daily.       No facility-administered medications prior to visit.   Allergies  Allergen Reactions  . Penicillins Itching  . Sulfa Antibiotics Swelling   Patient Active Problem List  Diagnosis  . Chest pain  . Morbid obesity  . Obstructive sleep apnea  . Fibromyalgia  . Other and unspecified hyperlipidemia  . Hx of gastritis  . S/P cholecystectomy   History  Substance Use Topics  . Smoking status: Never Smoker   . Smokeless tobacco: Never Used  . Alcohol Use: No   family history includes Cancer in her maternal grandfather and paternal uncle; Diabetes in her mother; Heart attack in her brother; Hypertension in her mother; and Lung cancer in her paternal aunt and sister.      Objective:   Physical Exam well-developed older white female in no acute distress, obese pleasant accompanied by her husband. Blood pressure 140/70 pulse 66 height 4 foot 11 weight 235 BMI 46. HEENT; nontraumatic normocephalic EOMI PERRLA sclera anicteric, Neck; supple no JVD, Cardiovascular; regular rate and rhythm with S1-S2 no murmur or gallop, Pulmonary; clear bilaterally, Abdomen; obese soft nontender no palpable mass or hepatosplenomegaly bowel sounds are active, Rectal; exam not done has just been documented Hemoccult positive, Extremities; no clubbing cyanosis or edema skin warm and dry, Psych; mood and affect normal and appropriate        Assessment & Plan:  #72 67 year old white female with Hemoccult positive stool, etiology unclear. Patient did have gastritis on endoscopy in November of 2013 and this is a potential source however she has been treated for H. pylori and also is on chronic PPI. Will need to rule out occult colon lesion. #2 bile salt induced diarrhea #3 status post remote cholecystectomy #4 obesity #5 fibromyalgia #6 sleep apnea  Plan; will check iron studies today Schedule for colonoscopy with Dr. Kathlee Nations was discussed in detail with the patient and her husband and they are agreeable to proceed Will also give her a trial of Questran 4 g daily in juice or water at least 2 hours away from any other medications to see if this helps with her chronic post cholecystectomy diarrhea Further plans pending colonoscopy

## 2013-03-07 NOTE — Patient Instructions (Addendum)
Please go to the basement level to have your labs drawn.  We sent a prescription for Questran packets for the diarrhea. Take 2 hours away from any other medications. We also faxed the prescritption for the $10.00 off savings for the colonoscopy prep.  You have been scheduled for a colonoscopy with propofol. Please follow written instructions given to you at your visit today.  Please pick up your prep kit at the pharmacy within the next 1-3 days. If you use inhalers (even only as needed), please bring them with you on the day of your procedure.

## 2013-03-10 ENCOUNTER — Encounter: Payer: Self-pay | Admitting: Gastroenterology

## 2013-03-10 NOTE — Progress Notes (Signed)
i agree with the plan outlined in this note 

## 2013-03-10 NOTE — Telephone Encounter (Signed)
Called the patient and told her I have a prep I can give her.  I didn't think we had any when she was in the office. Her husband will pick it up on Mon 03-10-13.  They thanked me.

## 2013-03-14 ENCOUNTER — Ambulatory Visit (AMBULATORY_SURGERY_CENTER): Payer: Medicare Other | Admitting: Gastroenterology

## 2013-03-14 ENCOUNTER — Encounter: Payer: Self-pay | Admitting: Gastroenterology

## 2013-03-14 VITALS — BP 105/65 | HR 74 | Temp 98.2°F | Resp 20 | Ht 59.5 in | Wt 235.0 lb

## 2013-03-14 DIAGNOSIS — R195 Other fecal abnormalities: Secondary | ICD-10-CM

## 2013-03-14 DIAGNOSIS — R197 Diarrhea, unspecified: Secondary | ICD-10-CM

## 2013-03-14 MED ORDER — SODIUM CHLORIDE 0.9 % IV SOLN: 500.0000 mL | INTRAVENOUS | Status: DC

## 2013-03-14 NOTE — Progress Notes (Signed)
Patient did not experience any of the following events: a burn prior to discharge; a fall within the facility; wrong site/side/patient/procedure/implant event; or a hospital transfer or hospital admission upon discharge from the facility. (G8907)Patient did not have preoperative order for IV antibiotic SSI prophylaxis. (G8918) ewm 

## 2013-03-14 NOTE — Op Note (Signed)
[facility] [address] [place], [address]   COLONOSCOPY PROCEDURE REPORT  PATIENT: [name], [name]  MR#: [id number] BIRTHDATE: 1946-09-10 , 66  yrs. old GENDER: Female ENDOSCOPIST: Rachael Fee, MD PROCEDURE DATE:  03/14/2013 PROCEDURE:   Colonoscopy, screening ASA CLASS:   Class II INDICATIONS:Routine risk for CRC: Colonoscopy November 2004 by Dr. Virginia Rochester; indications Hemoccult-positive stool, findings normal colon. Colonoscopy March 2006 Dr.  Virginia Rochester; indications "question of polyps" findings normal colon, no polyps.  Recall colonoscopy March 2016; underwent stool testing 2014 as part of routine physical MEDICATIONS: Propofol (Diprivan) 130 mg IV and MAC sedation, administered by CRNA  DESCRIPTION OF PROCEDURE:   After the risks benefits and alternatives of the procedure were thoroughly explained, informed consent was obtained.  A digital rectal exam revealed no abnormalities of the rectum.   The Pentax Ped Colon [id number] endoscope was introduced through the anus and advanced to the cecum, which was identified by both the appendix and ileocecal valve. No adverse events experienced.   The quality of the prep was good.  The instrument was then slowly withdrawn as the colon was fully examined.   COLON FINDINGS: A normal appearing cecum, ileocecal valve, and appendiceal orifice were identified.  The ascending, hepatic flexure, transverse, splenic flexure, descending, sigmoid colon and rectum appeared unremarkable.  No polyps or cancers were seen. Retroflexed views revealed no abnormalities. The time to cecum=2 minutes 53 seconds.  Withdrawal time=6 minutes 01 seconds.  The scope was withdrawn and the procedure completed.  COMPLICATIONS: There were no complications.  ENDOSCOPIC IMPRESSION: Normal colon No polyps or cancer  RECOMMENDATIONS: You should continue to follow colorectal cancer screening guidelines for "routine risk" patients with a repeat  colonoscopy in 10 years. There is no need for FOBT (stool) testing for at least 5 years.   eSigned:  Rachael Fee, MD 03/14/2013 10:04 AM   cc: Larina Earthly, MD

## 2013-03-14 NOTE — Patient Instructions (Addendum)
YOU HAD AN ENDOSCOPIC PROCEDURE TODAY AT THE [facility]: Refer to the procedure report that was given to you for any specific questions about what was found during the examination.  If the procedure report does not answer your questions, please call your gastroenterologist to clarify.  If you requested that your care partner not be given the details of your procedure findings, then the procedure report has been included in a sealed envelope for you to review at your convenience later.  YOU SHOULD EXPECT: Some feelings of bloating in the abdomen. Passage of more gas than usual.  Walking can help get rid of the air that was put into your GI tract during the procedure and reduce the bloating. If you had a lower endoscopy (such as a colonoscopy or flexible sigmoidoscopy) you may notice spotting of blood in your stool or on the toilet paper. If you underwent a bowel prep for your procedure, then you may not have a normal bowel movement for a few days.  DIET: Your first meal following the procedure should be a light meal and then it is ok to progress to your normal diet.  A half-sandwich or bowl of soup is an example of a good first meal.  Heavy or fried foods are harder to digest and may make you feel nauseous or bloated.  Likewise meals heavy in dairy and vegetables can cause extra gas to form and this can also increase the bloating.  Drink plenty of fluids but you should avoid alcoholic beverages for 24 hours.  ACTIVITY: Your care partner should take you home directly after the procedure.  You should plan to take it easy, moving slowly for the rest of the day.  You can resume normal activity the day after the procedure however you should NOT DRIVE or use heavy machinery for 24 hours (because of the sedation medicines used during the test).    SYMPTOMS TO REPORT IMMEDIATELY: A gastroenterologist can be reached at any hour.  During normal business hours, 8:30 AM to 5:00 PM Monday through Friday,  call [phone].  After hours and on weekends, please call the GI answering service at [phone] emergency number  who will take a message and have the physician on call contact you.   Following lower endoscopy (colonoscopy or flexible sigmoidoscopy):  Excessive amounts of blood in the stool  Significant tenderness or worsening of abdominal pains  Swelling of the abdomen that is new, acute  Fever of 100F or higher  FOLLOW UP: If any biopsies were taken you will be contacted by phone or by letter within the next 1-3 weeks.  Call your gastroenterologist if you have not heard about the biopsies in 3 weeks.  Our staff will call the home number listed on your records the next business day following your procedure to check on you and address any questions or concerns that you may have at that time regarding the information given to you following your procedure. This is a courtesy call and so if there is no answer at the home number and we have not heard from you through the emergency physician on call, we will assume that you have returned to your regular daily activities without incident.  SIGNATURES/CONFIDENTIALITY: You and/or your care partner have signed paperwork which will be entered into your electronic medical record.  These signatures attest to the fact that that the information above on your After Visit Summary has been reviewed and is understood.  Full responsibility of the   confidentiality of this discharge information lies with you and/or your care-partner.   Repeat colon 10 years

## 2013-03-17 ENCOUNTER — Telehealth: Payer: Self-pay | Admitting: *Deleted

## 2013-03-17 NOTE — Telephone Encounter (Signed)
  Follow up Call-  Call back number 03/14/2013 11/08/2012  Post procedure Call Back phone  # [phone]  Permission to leave phone message Yes Yes     Patient questions:  Do you have a fever, pain , or abdominal swelling? no Pain Score  0 *  Have you tolerated food without any problems? yes  Have you been able to return to your normal activities? yes  Do you have any questions about your discharge instructions: Diet   no Medications  no Follow up visit  no  Do you have questions or concerns about your Care? no  Actions: * If pain score is 4 or above: No action needed, pain <4.

## 2013-04-01 ENCOUNTER — Other Ambulatory Visit (HOSPITAL_COMMUNITY): Payer: Self-pay | Admitting: Surgery

## 2013-04-01 ENCOUNTER — Ambulatory Visit (HOSPITAL_COMMUNITY)
Admission: RE | Admit: 2013-04-01 | Discharge: 2013-04-01 | Disposition: A | Discharge status: Home / Self Care | Payer: Medicare Other | Source: Ambulatory Visit | Attending: Orthopedic Surgery | Admitting: Orthopedic Surgery

## 2013-04-01 DIAGNOSIS — M25562 Pain in left knee: Secondary | ICD-10-CM

## 2013-04-01 DIAGNOSIS — M79609 Pain in unspecified limb: Secondary | ICD-10-CM | POA: Insufficient documentation

## 2013-04-01 DIAGNOSIS — M7989 Other specified soft tissue disorders: Secondary | ICD-10-CM

## 2013-04-01 NOTE — Progress Notes (Signed)
VASCULAR LAB PRELIMINARY  PRELIMINARY  PRELIMINARY  PRELIMINARY  Left lower extremity venous duplex completed.    Preliminary report:  Left:  No evidence of DVT, superficial thrombosis, or Baker's cyst.  [name], RVS 04/01/2013, 12:50 PM

## 2014-09-05 ENCOUNTER — Encounter (HOSPITAL_COMMUNITY): Payer: Self-pay | Admitting: Emergency Medicine

## 2014-09-05 ENCOUNTER — Emergency Department (HOSPITAL_COMMUNITY)
Admission: EM | Admit: 2014-09-05 | Discharge: 2014-09-05 | Disposition: A | Discharge status: Home / Self Care | Payer: Medicare HMO | Attending: Emergency Medicine | Admitting: Emergency Medicine

## 2014-09-05 ENCOUNTER — Emergency Department (HOSPITAL_COMMUNITY): Payer: Medicare HMO

## 2014-09-05 DIAGNOSIS — Z880 Allergy status to penicillin: Secondary | ICD-10-CM | POA: Diagnosis not present

## 2014-09-05 DIAGNOSIS — I10 Essential (primary) hypertension: Secondary | ICD-10-CM | POA: Diagnosis not present

## 2014-09-05 DIAGNOSIS — Y929 Unspecified place or not applicable: Secondary | ICD-10-CM | POA: Diagnosis not present

## 2014-09-05 DIAGNOSIS — S2249XA Multiple fractures of ribs, unspecified side, initial encounter for closed fracture: Secondary | ICD-10-CM | POA: Diagnosis not present

## 2014-09-05 DIAGNOSIS — K219 Gastro-esophageal reflux disease without esophagitis: Secondary | ICD-10-CM | POA: Insufficient documentation

## 2014-09-05 DIAGNOSIS — E669 Obesity, unspecified: Secondary | ICD-10-CM | POA: Diagnosis not present

## 2014-09-05 DIAGNOSIS — M1710 Unilateral primary osteoarthritis, unspecified knee: Secondary | ICD-10-CM | POA: Diagnosis not present

## 2014-09-05 DIAGNOSIS — F329 Major depressive disorder, single episode, unspecified: Secondary | ICD-10-CM | POA: Insufficient documentation

## 2014-09-05 DIAGNOSIS — F411 Generalized anxiety disorder: Secondary | ICD-10-CM | POA: Insufficient documentation

## 2014-09-05 DIAGNOSIS — Z7982 Long term (current) use of aspirin: Secondary | ICD-10-CM | POA: Insufficient documentation

## 2014-09-05 DIAGNOSIS — Y9389 Activity, other specified: Secondary | ICD-10-CM | POA: Diagnosis not present

## 2014-09-05 DIAGNOSIS — IMO0002 Reserved for concepts with insufficient information to code with codable children: Secondary | ICD-10-CM | POA: Diagnosis not present

## 2014-09-05 DIAGNOSIS — G8929 Other chronic pain: Secondary | ICD-10-CM | POA: Diagnosis not present

## 2014-09-05 DIAGNOSIS — W108XXA Fall (on) (from) other stairs and steps, initial encounter: Secondary | ICD-10-CM | POA: Diagnosis not present

## 2014-09-05 DIAGNOSIS — E039 Hypothyroidism, unspecified: Secondary | ICD-10-CM | POA: Insufficient documentation

## 2014-09-05 DIAGNOSIS — F3289 Other specified depressive episodes: Secondary | ICD-10-CM | POA: Insufficient documentation

## 2014-09-05 DIAGNOSIS — S298XXA Other specified injuries of thorax, initial encounter: Secondary | ICD-10-CM | POA: Insufficient documentation

## 2014-09-05 DIAGNOSIS — Z79899 Other long term (current) drug therapy: Secondary | ICD-10-CM | POA: Insufficient documentation

## 2014-09-05 DIAGNOSIS — S2232XA Fracture of one rib, left side, initial encounter for closed fracture: Secondary | ICD-10-CM

## 2014-09-05 LAB — BASIC METABOLIC PANEL
Anion gap: 12 (ref 5–15)
BUN: 13 mg/dL (ref 6–23)
CO2: 26 mEq/L (ref 19–32)
Calcium: 9.5 mg/dL (ref 8.4–10.5)
Chloride: 101 mEq/L (ref 96–112)
Creatinine, Ser: 0.61 mg/dL (ref 0.50–1.10)
GFR calc Af Amer: >90 mL/min (ref >90)
GFR calc non Af Amer: >90 mL/min (ref >90)
Glucose, Bld: 115 mg/dL — ABNORMAL HIGH (ref 70–99)
Potassium: 3.8 mEq/L (ref 3.7–5.3)
Sodium: 139 mEq/L (ref 137–147)

## 2014-09-05 LAB — CBC WITH DIFFERENTIAL/PLATELET
Basophils Absolute: 0 10*3/uL (ref 0.0–0.1)
Basophils Relative: 0 % (ref 0–1)
Eosinophils Absolute: 0.1 10*3/uL (ref 0.0–0.7)
Eosinophils Relative: 1 % (ref 0–5)
HCT: 42.5 % (ref 36.0–46.0)
Hemoglobin: 14.4 g/dL (ref 12.0–15.0)
Lymphocytes Relative: 18 % (ref 12–46)
Lymphs Abs: 1.5 10*3/uL (ref 0.7–4.0)
MCH: 29.9 pg (ref 26.0–34.0)
MCHC: 33.9 g/dL (ref 30.0–36.0)
MCV: 88.4 fL (ref 78.0–100.0)
Monocytes Absolute: 0.8 10*3/uL (ref 0.1–1.0)
Monocytes Relative: 10 % (ref 3–12)
Neutro Abs: 5.7 10*3/uL (ref 1.7–7.7)
Neutrophils Relative %: 71 % (ref 43–77)
Platelets: 191 10*3/uL (ref 150–400)
RBC: 4.81 MIL/uL (ref 3.87–5.11)
RDW: 13.4 % (ref 11.5–15.5)
WBC: 8.1 10*3/uL (ref 4.0–10.5)

## 2014-09-05 MED ORDER — KETOROLAC TROMETHAMINE 30 MG/ML IJ SOLN
30.0000 mg | Freq: Once | INTRAMUSCULAR | Status: AC
  Administered 2014-09-05: 30 mg via INTRAVENOUS
  Filled 2014-09-05: qty 1

## 2014-09-05 MED ORDER — OXYCODONE-ACETAMINOPHEN 5-325 MG PO TABS: 1.0000 | ORAL_TABLET | ORAL | Status: DC | PRN

## 2014-09-05 MED ORDER — MORPHINE SULFATE 4 MG/ML IJ SOLN
4.0000 mg | Freq: Once | INTRAMUSCULAR | Status: AC
  Administered 2014-09-05: 4 mg via INTRAVENOUS
  Filled 2014-09-05: qty 1

## 2014-09-05 MED ORDER — ONDANSETRON HCL 4 MG/2ML IJ SOLN
4.0000 mg | Freq: Once | INTRAMUSCULAR | Status: AC
  Administered 2014-09-05: 4 mg via INTRAVENOUS
  Filled 2014-09-05: qty 2

## 2014-09-05 MED ORDER — ONDANSETRON HCL 8 MG PO TABS: 8.0000 mg | ORAL_TABLET | ORAL | Status: DC | PRN

## 2014-09-05 MED ORDER — SODIUM CHLORIDE 0.9 % IV BOLUS (SEPSIS)
500.0000 mL | Freq: Once | INTRAVENOUS | Status: AC
  Administered 2014-09-05: 500 mL via INTRAVENOUS

## 2014-09-05 NOTE — ED Notes (Signed)
Slipped on water on steps and fell. Landed on left side. Pt denies hitting head. Increased pain with movement. Pt alert/oriented. Guarding left rib area with moaning and grimacing with movement from ems stretcher.

## 2014-09-05 NOTE — Discharge Instructions (Signed)
You have fractured 3 ribs on your left side. Medication for pain and nausea. You must take deep breaths. Use a pillow for comfort. Followup your primary care Dr.

## 2014-09-05 NOTE — ED Provider Notes (Signed)
CSN: [id number]     Arrival date & time 09/05/14  1150 History   First MD Initiated Contact with Patient 09/05/14 1207     This chart was scribed for Nat Christen, MD by Evelene Croon, ED Scribe. This patient was seen in room APA15/APA15 and the patient's care was started 12:08 PM.  Chief Complaint  Patient presents with  . Fall      The history is provided by the patient.    HPI Comments:  [name] is a 68 y.o. female brought in by ambulance s/p accidental fall this am,  presents to the Emergency Department complaining of constant moderate pain to her left mid posterior lateral ribs following the incident. Pt states she tripped on the carpet. Denies pain to her bilateral upper and lower extremities as well as hip pain. No alleviating factors noted. No head injury or LOC.    Past Medical History  Diagnosis Date  . Hypertension   . H/O hiatal hernia   . Depression   . Chronic back pain   . Thyroid disease   . Allergy     SEASONAL  . Anemia   . Anxiety   . Arthritis     BACK/KNEE  . GERD (gastroesophageal reflux disease)    Past Surgical History  Procedure Laterality Date  . Abdominal hysterectomy    . Cholecystectomy    . Tonsillectomy    . Rectocele repair    . 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 Colonoscopy    . Upper gastrointestinal endoscopy    . Carpal tunnel release      right and lt  . Fracture surgery      rt hand  . Fracture surgery      lr wrist  . Orif finger fracture  04/11/2012    Procedure: OPEN REDUCTION INTERNAL FIXATION (ORIF) METACARPAL (FINGER) FRACTURE;  Surgeon: Ninetta Lights, MD;  Location: [facility];  Service: Orthopedics;  Laterality: Left;  orif left hand 4th metacarpal    Family History  Problem Relation Age of Onset  . Hypertension Mother   . Diabetes Mother   . Cancer Maternal Grandfather     back  . Cancer Paternal Uncle   . Lung cancer Paternal Aunt   . Lung cancer Sister   . Heart attack  Brother     x 2   History  Substance Use Topics  . Smoking status: Never Smoker   . Smokeless tobacco: Never Used  . Alcohol Use: No   OB History   Grav Para Term Preterm Abortions TAB SAB Ect Mult Living                 Review of Systems  All other systems reviewed and are negative.  10 systems reviewed and negative other than pertinent ROS in HPI     Allergies  Penicillins and Sulfa antibiotics  Home Medications   Prior to Admission medications   Medication Sig Start Date End Date Taking? Authorizing Provider  aspirin 81 MG tablet Take 81 mg by mouth daily.   Yes Historical Provider, MD  buPROPion (WELLBUTRIN SR) 150 MG 12 hr tablet Take 150 mg by mouth daily.   Yes Historical Provider, MD  cholestyramine Lucrezia Starch) 4 G packet 1 packet in juice or water once daily. Take 2 hours away from other medications. 03/07/13  Yes Amy S Esterwood, PA-C  citalopram (CELEXA) 40 MG tablet Take 40 mg by mouth every  evening.    Yes Historical Provider, MD  Ferrous Sulfate (IRON) 325 (65 FE) MG TABS Take 1 tablet by mouth daily.   Yes Historical Provider, MD  fluticasone (FLONASE) 50 MCG/ACT nasal spray Place 2 sprays into the nose daily.   Yes Historical Provider, MD  HYDROcodone-acetaminophen (NORCO/VICODIN) 5-325 MG per tablet Take 1 tablet by mouth every 6 (six) hours as needed for moderate pain.  08/20/12  Yes Historical Provider, MD  levothyroxine (SYNTHROID, LEVOTHROID) 88 MCG tablet Take 88 mcg by mouth daily.   Yes Historical Provider, MD  loratadine (CLARITIN) 10 MG tablet Take 10 mg by mouth daily.   Yes Historical Provider, MD  omeprazole (PRILOSEC) 40 MG capsule Take 1 capsule (40 mg total) by mouth daily before breakfast. 01/07/13  Yes Milus Banister, MD  OVER THE COUNTER MEDICATION Calcium + D OTC 600mg  one a day   Yes Historical Provider, MD  potassium chloride SA (K-DUR,KLOR-CON) 20 MEQ tablet Take 20 mEq by mouth 2 (two) times daily.   Yes Historical Provider, MD   pravastatin (PRAVACHOL) 20 MG tablet Take 20 mg by mouth at bedtime.    Yes Historical Provider, MD  ranitidine (ZANTAC) 75 MG tablet Take 75 mg by mouth at bedtime.   Yes Historical Provider, MD  traZODone (DESYREL) 50 MG tablet Take 50 mg by mouth at bedtime.   Yes Historical Provider, MD  valsartan-hydrochlorothiazide (DIOVAN-HCT) 80-12.5 MG per tablet Take 1 tablet by mouth daily.   Yes Historical Provider, MD  ondansetron (ZOFRAN) 8 MG tablet Take 1 tablet (8 mg total) by mouth every 4 (four) hours as needed. 09/05/14   Nat Christen, MD  oxyCODONE-acetaminophen (PERCOCET) 5-325 MG per tablet Take 1-2 tablets by mouth every 4 (four) hours as needed. 09/05/14   Nat Christen, MD   BP 157/89  Pulse 86  Temp(Src) 98.1 F (36.7 C) (Oral)  Resp 20  SpO2 96% Physical Exam  Nursing note and vitals reviewed. Constitutional: She is oriented to person, place, and time.  Obese    HENT:  Head: Normocephalic and atraumatic.  Eyes: Conjunctivae and EOM are normal. Pupils are equal, round, and reactive to light.  Neck: Normal range of motion. Neck supple.  Cardiovascular: Normal rate, regular rhythm and normal heart sounds.   Pulmonary/Chest: Effort normal and breath sounds normal.  Abdominal: Soft. Bowel sounds are normal.  Musculoskeletal:  Tenderness to left mid lateral posterior ribs   Neurological: She is alert and oriented to person, place, and time.  Skin: Skin is warm and dry.  Psychiatric: She has a normal mood and affect. Her behavior is normal.    ED Course  Procedures (including critical care time)  DIAGNOSTIC STUDIES:  Oxygen Saturation is 96% on RA, adequate by my interpretation.    COORDINATION OF CARE:  2:58 PM Discussed treatment plan with pt at bedside and pt agreed to plan.  Labs Review Labs Reviewed  BASIC METABOLIC PANEL - Abnormal; Notable for the following:    Glucose, Bld 115 (*)    All other components within normal limits  CBC WITH DIFFERENTIAL    Imaging  Review Dg Ribs Unilateral W/chest Left  09/05/2014   CLINICAL DATA:  Fall  EXAM: LEFT RIBS AND CHEST - 3+ VIEW  COMPARISON:  Chest radiograph 05/25/2005  FINDINGS: Patient is rotated to the right. Heart size is mildly enlarged. The lungs are clear. Negative for pneumothorax or visible pleural effusion. There are acute, displaced fractures of the posterior left sixth and seventh ribs.  A nondisplaced fracture of the posterior left fifth rib is suspected.  IMPRESSION: 1. Definite acute displaced fractures of the posterior left sixth and seventh ribs. 2. Normal acute nondisplaced fracture of the left posterior fifth rib. 3. Cardiomegaly without acute findings in the lungs.   Electronically Signed   By: Curlene Dolphin M.D.   On: 09/05/2014 13:23     EKG Interpretation None      MDM   Final diagnoses:  Rib fracture, left, closed, initial encounter    Plain films of left ribs show dentures of the fifth, sixth, and seventh ribs.   No pneumothorax. Patient was treated for pain. Test results discussed with patient and her husband. Discharge medications Percocet and Zofran 8 mg     I personally performed the services described in this documentation, which was scribed in my presence. The recorded information has been reviewed and is accurate.     Nat Christen, MD 09/05/14 1459

## 2014-09-05 NOTE — ED Notes (Signed)
Patient with no complaints at this time. Respirations even and unlabored. Skin warm/dry. Discharge instructions reviewed with patient at this time. Patient given opportunity to voice concerns/ask questions. IV removed per policy and band-aid applied to site. Patient discharged at this time and left Emergency Department with steady gait.  

## 2014-11-26 DIAGNOSIS — M810 Age-related osteoporosis without current pathological fracture: Secondary | ICD-10-CM | POA: Insufficient documentation

## 2014-11-26 DIAGNOSIS — M545 Low back pain, unspecified: Secondary | ICD-10-CM | POA: Insufficient documentation

## 2014-11-30 ENCOUNTER — Other Ambulatory Visit (HOSPITAL_COMMUNITY): Payer: Self-pay | Admitting: Neurosurgery

## 2014-12-02 ENCOUNTER — Other Ambulatory Visit (HOSPITAL_COMMUNITY): Payer: Self-pay | Admitting: Neurosurgery

## 2014-12-02 DIAGNOSIS — I729 Aneurysm of unspecified site: Secondary | ICD-10-CM

## 2014-12-04 ENCOUNTER — Other Ambulatory Visit (HOSPITAL_COMMUNITY): Payer: Self-pay | Admitting: Interventional Radiology

## 2014-12-04 DIAGNOSIS — M549 Dorsalgia, unspecified: Secondary | ICD-10-CM

## 2014-12-04 DIAGNOSIS — S32010A Wedge compression fracture of first lumbar vertebra, initial encounter for closed fracture: Secondary | ICD-10-CM

## 2014-12-08 ENCOUNTER — Other Ambulatory Visit (HOSPITAL_COMMUNITY): Payer: Medicare Other

## 2014-12-08 ENCOUNTER — Encounter (HOSPITAL_COMMUNITY)
Admission: RE | Admit: 2014-12-08 | Discharge: 2014-12-08 | Disposition: A | Discharge status: Home / Self Care | Payer: Medicare HMO | Source: Ambulatory Visit | Attending: Neurosurgery | Admitting: Neurosurgery

## 2014-12-08 ENCOUNTER — Encounter (HOSPITAL_COMMUNITY): Payer: Self-pay

## 2014-12-08 DIAGNOSIS — Z01818 Encounter for other preprocedural examination: Secondary | ICD-10-CM | POA: Insufficient documentation

## 2014-12-08 DIAGNOSIS — I517 Cardiomegaly: Secondary | ICD-10-CM | POA: Diagnosis not present

## 2014-12-08 HISTORY — DX: Fibromyalgia: M79.7

## 2014-12-08 HISTORY — DX: Sleep apnea, unspecified: G47.30

## 2014-12-08 HISTORY — DX: Hyperlipidemia, unspecified: E78.5

## 2014-12-08 HISTORY — DX: Unspecified cataract: H26.9

## 2014-12-08 HISTORY — DX: Personal history of colonic polyps: Z86.010

## 2014-12-08 HISTORY — DX: Insomnia, unspecified: G47.00

## 2014-12-08 HISTORY — DX: Personal history of colon polyps, unspecified: Z86.0100

## 2014-12-08 HISTORY — DX: Personal history of other infectious and parasitic diseases: Z86.19

## 2014-12-08 HISTORY — DX: Pain in unspecified joint: M25.50

## 2014-12-08 HISTORY — DX: Other muscle spasm: M62.838

## 2014-12-08 HISTORY — DX: Paresthesia of skin: R20.0

## 2014-12-08 HISTORY — DX: Anesthesia of skin: R20.2

## 2014-12-08 HISTORY — DX: Nocturia: R35.1

## 2014-12-08 HISTORY — DX: Nausea: R11.0

## 2014-12-08 LAB — CBC
HCT: 43.2 % (ref 36.0–46.0)
Hemoglobin: 14.7 g/dL (ref 12.0–15.0)
MCH: 30.5 pg (ref 26.0–34.0)
MCHC: 34 g/dL (ref 30.0–36.0)
MCV: 89.6 fL (ref 78.0–100.0)
Platelets: 182 10*3/uL (ref 150–400)
RBC: 4.82 MIL/uL (ref 3.87–5.11)
RDW: 14 % (ref 11.5–15.5)
WBC: 6.9 10*3/uL (ref 4.0–10.5)

## 2014-12-08 LAB — BASIC METABOLIC PANEL
Anion gap: 12 (ref 5–15)
BUN: 8 mg/dL (ref 6–23)
CO2: 26 mEq/L (ref 19–32)
Calcium: 9.4 mg/dL (ref 8.4–10.5)
Chloride: 105 mEq/L (ref 96–112)
Creatinine, Ser: 0.55 mg/dL (ref 0.50–1.10)
GFR calc Af Amer: >90 mL/min (ref >90)
GFR calc non Af Amer: >90 mL/min (ref >90)
Glucose, Bld: 83 mg/dL (ref 70–99)
Potassium: 3.9 mEq/L (ref 3.7–5.3)
Sodium: 143 mEq/L (ref 137–147)

## 2014-12-08 LAB — SURGICAL PCR SCREEN
MRSA, PCR: NEGATIVE
Staphylococcus aureus: NEGATIVE

## 2014-12-08 NOTE — Pre-Procedure Instructions (Signed)
[name]  12/08/2014   Your procedure is scheduled on:  Mon, Dec 28 @ 2:40 PM  Report to [facility] @ 11:30 AM  Call this number if you have problems the morning of surgery: [phone]   Remember:   Do not eat food or drink liquids after midnight.   Take these medicines the morning of surgery with A SIP OF WATER: Wellbutrin(Bupropion),Flonase(Fluticasone),Pain Pill(if needed),Synthroid(Levothyroxine),Claritin(Loratadine),Omeprazole(Prilosec),and Zofran(Ondansetron)                 Stop taking your Aspirin and Lovaza. No Goody's,BC's,Aleve,Ibuprofen,Fish Oil,or any Herbal Medications   Do not wear jewelry, make-up or nail polish.  Do not wear lotions, powders, or perfumes. You may wear deodorant.  Do not shave 48 hours prior to surgery.   Do not bring valuables to the hospital.  [facility] is not responsible                  for any belongings or valuables.               Contacts, dentures or bridgework may not be worn into surgery.  Leave suitcase in the car. After surgery it may be brought to your room.  For patients admitted to the hospital, discharge time is determined by your                treatment team.               Patients discharged the day of surgery will not be allowed to drive  home.    Special Instructions:  [place] - Preparing for Surgery  Before surgery, you can play an important role.  Because skin is not sterile, your skin needs to be as free of germs as possible.  You can reduce the number of germs on you skin by washing with CHG (chlorahexidine gluconate) soap before surgery.  CHG is an antiseptic cleaner which kills germs and bonds with the skin to continue killing germs even after washing.  Please DO NOT use if you have an allergy to CHG or antibacterial soaps.  If your skin becomes reddened/irritated stop using the CHG and inform your nurse when you arrive at Short Stay.  Do not shave (including legs and underarms) for at least 48 hours  prior to the first CHG shower.  You may shave your face.  Please follow these instructions carefully:   1.  Shower with CHG Soap the night before surgery and the                                morning of Surgery.  2.  If you choose to wash your hair, wash your hair first as usual with your       normal shampoo.  3.  After you shampoo, rinse your hair and body thoroughly to remove the                      Shampoo.  4.  Use CHG as you would any other liquid soap.  You can apply chg directly       to the skin and wash gently with scrungie or a clean washcloth.  5.  Apply the CHG Soap to your body ONLY FROM THE NECK DOWN.        Do not use on open wounds or open sores.  Avoid contact with your eyes,  ears, mouth and genitals (private parts).  Wash genitals (private parts)       with your normal soap.  6.  Wash thoroughly, paying special attention to the area where your surgery        will be performed.  7.  Thoroughly rinse your body with warm water from the neck down.  8.  DO NOT shower/wash with your normal soap after using and rinsing off       the CHG Soap.  9.  Pat yourself dry with a clean towel.            10.  Wear clean pajamas.            11.  Place clean sheets on your bed the night of your first shower and do not        sleep with pets.  Day of Surgery  Do not apply any lotions/deoderants the morning of surgery.  Please wear clean clothes to the hospital/surgery center.     Please read over the following fact sheets that you were given: Pain Booklet, Coughing and Deep Breathing, MRSA Information and Surgical Site Infection Prevention

## 2014-12-08 NOTE — Progress Notes (Addendum)
Heart cath in epic from 2003  Medical Md is Dr.Avva  Cardiologist is Dr.Ganji and only sees as needed;last time she saw him was 12/2012  Echo done about 13 yrs ago  Stress test done about 13 yrs ago  Heart cath in epic from 2003  Denies EKG or CXR in past yr

## 2014-12-09 ENCOUNTER — Ambulatory Visit (HOSPITAL_COMMUNITY): Admission: RE | Admit: 2014-12-09 | Payer: No Typology Code available for payment source | Source: Ambulatory Visit

## 2014-12-10 ENCOUNTER — Ambulatory Visit (HOSPITAL_COMMUNITY): Admission: RE | Admit: 2014-12-10 | Payer: No Typology Code available for payment source | Source: Ambulatory Visit

## 2014-12-20 MED ORDER — VANCOMYCIN HCL 10 G IV SOLR
1500.0000 mg | INTRAVENOUS | Status: AC
  Administered 2014-12-21: 1500 mg via INTRAVENOUS
  Filled 2014-12-20: qty 1500

## 2014-12-21 ENCOUNTER — Encounter (HOSPITAL_COMMUNITY): Payer: Self-pay | Admitting: Anesthesiology

## 2014-12-21 ENCOUNTER — Inpatient Hospital Stay (HOSPITAL_COMMUNITY): Payer: Medicare HMO

## 2014-12-21 ENCOUNTER — Encounter (HOSPITAL_COMMUNITY)
Admission: RE | Disposition: A | Discharge status: Home / Self Care | Payer: Self-pay | Source: Ambulatory Visit | Attending: Neurosurgery

## 2014-12-21 ENCOUNTER — Inpatient Hospital Stay (HOSPITAL_COMMUNITY): Payer: Medicare HMO | Admitting: Anesthesiology

## 2014-12-21 ENCOUNTER — Inpatient Hospital Stay (HOSPITAL_COMMUNITY)
Admission: RE | Admit: 2014-12-21 | Discharge: 2014-12-22 | DRG: 517 | Disposition: A | Discharge status: Home / Self Care | Payer: Medicare HMO | Source: Ambulatory Visit | Attending: Neurosurgery | Admitting: Neurosurgery

## 2014-12-21 DIAGNOSIS — K219 Gastro-esophageal reflux disease without esophagitis: Secondary | ICD-10-CM | POA: Diagnosis present

## 2014-12-21 DIAGNOSIS — M797 Fibromyalgia: Secondary | ICD-10-CM | POA: Diagnosis present

## 2014-12-21 DIAGNOSIS — S32000A Wedge compression fracture of unspecified lumbar vertebra, initial encounter for closed fracture: Secondary | ICD-10-CM | POA: Diagnosis present

## 2014-12-21 DIAGNOSIS — G4700 Insomnia, unspecified: Secondary | ICD-10-CM | POA: Diagnosis present

## 2014-12-21 DIAGNOSIS — Z880 Allergy status to penicillin: Secondary | ICD-10-CM

## 2014-12-21 DIAGNOSIS — G4730 Sleep apnea, unspecified: Secondary | ICD-10-CM | POA: Diagnosis present

## 2014-12-21 DIAGNOSIS — R51 Headache: Secondary | ICD-10-CM | POA: Diagnosis present

## 2014-12-21 DIAGNOSIS — F419 Anxiety disorder, unspecified: Secondary | ICD-10-CM | POA: Diagnosis present

## 2014-12-21 DIAGNOSIS — G8929 Other chronic pain: Secondary | ICD-10-CM | POA: Diagnosis present

## 2014-12-21 DIAGNOSIS — M545 Low back pain: Secondary | ICD-10-CM | POA: Diagnosis present

## 2014-12-21 DIAGNOSIS — Z8249 Family history of ischemic heart disease and other diseases of the circulatory system: Secondary | ICD-10-CM

## 2014-12-21 DIAGNOSIS — M13869 Other specified arthritis, unspecified knee: Secondary | ICD-10-CM | POA: Diagnosis present

## 2014-12-21 DIAGNOSIS — Z79899 Other long term (current) drug therapy: Secondary | ICD-10-CM

## 2014-12-21 DIAGNOSIS — Z882 Allergy status to sulfonamides status: Secondary | ICD-10-CM

## 2014-12-21 DIAGNOSIS — Z7982 Long term (current) use of aspirin: Secondary | ICD-10-CM | POA: Diagnosis not present

## 2014-12-21 DIAGNOSIS — R351 Nocturia: Secondary | ICD-10-CM | POA: Diagnosis present

## 2014-12-21 DIAGNOSIS — Z86010 Personal history of colonic polyps: Secondary | ICD-10-CM | POA: Diagnosis not present

## 2014-12-21 DIAGNOSIS — M62838 Other muscle spasm: Secondary | ICD-10-CM | POA: Diagnosis present

## 2014-12-21 DIAGNOSIS — E079 Disorder of thyroid, unspecified: Secondary | ICD-10-CM | POA: Diagnosis present

## 2014-12-21 DIAGNOSIS — I10 Essential (primary) hypertension: Secondary | ICD-10-CM | POA: Diagnosis present

## 2014-12-21 DIAGNOSIS — H269 Unspecified cataract: Secondary | ICD-10-CM | POA: Diagnosis present

## 2014-12-21 DIAGNOSIS — F329 Major depressive disorder, single episode, unspecified: Secondary | ICD-10-CM | POA: Diagnosis present

## 2014-12-21 DIAGNOSIS — Z90710 Acquired absence of both cervix and uterus: Secondary | ICD-10-CM

## 2014-12-21 DIAGNOSIS — Z9049 Acquired absence of other specified parts of digestive tract: Secondary | ICD-10-CM | POA: Diagnosis present

## 2014-12-21 DIAGNOSIS — J302 Other seasonal allergic rhinitis: Secondary | ICD-10-CM | POA: Diagnosis present

## 2014-12-21 DIAGNOSIS — E785 Hyperlipidemia, unspecified: Secondary | ICD-10-CM | POA: Diagnosis present

## 2014-12-21 DIAGNOSIS — M4690 Unspecified inflammatory spondylopathy, site unspecified: Secondary | ICD-10-CM | POA: Diagnosis present

## 2014-12-21 DIAGNOSIS — IMO0002 Reserved for concepts with insufficient information to code with codable children: Secondary | ICD-10-CM

## 2014-12-21 DIAGNOSIS — D649 Anemia, unspecified: Secondary | ICD-10-CM | POA: Diagnosis present

## 2014-12-21 DIAGNOSIS — M4856XA Collapsed vertebra, not elsewhere classified, lumbar region, initial encounter for fracture: Principal | ICD-10-CM | POA: Diagnosis present

## 2014-12-21 HISTORY — PX: KYPHOPLASTY: SHX5884

## 2014-12-21 SURGERY — KYPHOPLASTY
Anesthesia: General | Site: Spine Lumbar

## 2014-12-21 MED ORDER — EPHEDRINE SULFATE 50 MG/ML IJ SOLN
INTRAMUSCULAR | Status: DC | PRN
  Administered 2014-12-21: 10 mg via INTRAVENOUS

## 2014-12-21 MED ORDER — ACETAMINOPHEN 325 MG PO TABS: 650.0000 mg | ORAL_TABLET | ORAL | Status: DC | PRN

## 2014-12-21 MED ORDER — ACETAMINOPHEN 650 MG RE SUPP: 650.0000 mg | RECTAL | Status: DC | PRN

## 2014-12-21 MED ORDER — FENTANYL CITRATE 0.05 MG/ML IJ SOLN
100.0000 ug | Freq: Once | INTRAMUSCULAR | Status: AC
  Administered 2014-12-21: 25 ug via INTRAVENOUS

## 2014-12-21 MED ORDER — ASPIRIN EC 81 MG PO TBEC
81.0000 mg | DELAYED_RELEASE_TABLET | Freq: Every day | ORAL | Status: DC
  Administered 2014-12-21: 81 mg via ORAL
  Filled 2014-12-21 (×2): qty 1

## 2014-12-21 MED ORDER — FAMOTIDINE 10 MG PO TABS
10.0000 mg | ORAL_TABLET | Freq: Every day | ORAL | Status: DC
  Filled 2014-12-21 (×2): qty 1

## 2014-12-21 MED ORDER — BUPIVACAINE-EPINEPHRINE 0.5% -1:200000 IJ SOLN
INTRAMUSCULAR | Status: DC | PRN
  Administered 2014-12-21: 10 mL

## 2014-12-21 MED ORDER — SODIUM CHLORIDE 0.9 % IJ SOLN
INTRAMUSCULAR | Status: AC
  Filled 2014-12-21: qty 3

## 2014-12-21 MED ORDER — VANCOMYCIN HCL IN DEXTROSE 750-5 MG/150ML-% IV SOLN
750.0000 mg | Freq: Once | INTRAVENOUS | Status: AC
  Administered 2014-12-22: 750 mg via INTRAVENOUS
  Filled 2014-12-21: qty 150

## 2014-12-21 MED ORDER — HYDROMORPHONE HCL 1 MG/ML IJ SOLN
0.2500 mg | INTRAMUSCULAR | Status: DC | PRN
  Administered 2014-12-21 (×2): 0.5 mg via INTRAVENOUS

## 2014-12-21 MED ORDER — LACTATED RINGERS IV SOLN
INTRAVENOUS | Status: DC | PRN
  Administered 2014-12-21: 14:00:00 via INTRAVENOUS

## 2014-12-21 MED ORDER — FERROUS SULFATE 325 (65 FE) MG PO TABS
325.0000 mg | ORAL_TABLET | Freq: Every day | ORAL | Status: DC
  Administered 2014-12-21: 325 mg via ORAL
  Filled 2014-12-21 (×2): qty 1

## 2014-12-21 MED ORDER — HYDROMORPHONE HCL 1 MG/ML IJ SOLN
INTRAMUSCULAR | Status: AC
  Filled 2014-12-21: qty 1

## 2014-12-21 MED ORDER — DOCUSATE SODIUM 100 MG PO CAPS
100.0000 mg | ORAL_CAPSULE | Freq: Two times a day (BID) | ORAL | Status: DC
  Administered 2014-12-21: 100 mg via ORAL

## 2014-12-21 MED ORDER — LEVOTHYROXINE SODIUM 100 MCG PO TABS
100.0000 ug | ORAL_TABLET | Freq: Every day | ORAL | Status: DC
  Administered 2014-12-22: 100 ug via ORAL
  Filled 2014-12-21 (×2): qty 1

## 2014-12-21 MED ORDER — FLUTICASONE PROPIONATE 50 MCG/ACT NA SUSP
2.0000 | Freq: Every day | NASAL | Status: DC
  Filled 2014-12-21: qty 16

## 2014-12-21 MED ORDER — ONDANSETRON HCL 4 MG/2ML IJ SOLN
INTRAMUSCULAR | Status: DC | PRN
  Administered 2014-12-21: 4 mg via INTRAVENOUS

## 2014-12-21 MED ORDER — ONDANSETRON HCL 4 MG/2ML IJ SOLN: 4.0000 mg | INTRAMUSCULAR | Status: DC | PRN

## 2014-12-21 MED ORDER — VALSARTAN-HYDROCHLOROTHIAZIDE 80-12.5 MG PO TABS: 1.0000 | ORAL_TABLET | Freq: Every day | ORAL | Status: DC

## 2014-12-21 MED ORDER — OXYCODONE-ACETAMINOPHEN 5-325 MG PO TABS: 1.0000 | ORAL_TABLET | ORAL | Status: DC | PRN

## 2014-12-21 MED ORDER — FENTANYL CITRATE 0.05 MG/ML IJ SOLN
INTRAMUSCULAR | Status: AC
  Administered 2014-12-21: 25 ug via INTRAVENOUS
  Filled 2014-12-21: qty 2

## 2014-12-21 MED ORDER — LIDOCAINE HCL (CARDIAC) 20 MG/ML IV SOLN
INTRAVENOUS | Status: DC | PRN
  Administered 2014-12-21: 60 mg via INTRAVENOUS

## 2014-12-21 MED ORDER — FENTANYL CITRATE 0.05 MG/ML IJ SOLN
INTRAMUSCULAR | Status: AC
  Filled 2014-12-21: qty 5

## 2014-12-21 MED ORDER — PROPOFOL 10 MG/ML IV BOLUS
INTRAVENOUS | Status: AC
  Filled 2014-12-21: qty 20

## 2014-12-21 MED ORDER — FENTANYL CITRATE 0.05 MG/ML IJ SOLN
INTRAMUSCULAR | Status: DC | PRN
  Administered 2014-12-21 (×5): 50 ug via INTRAVENOUS

## 2014-12-21 MED ORDER — TRAZODONE HCL 50 MG PO TABS
50.0000 mg | ORAL_TABLET | Freq: Every day | ORAL | Status: DC
  Administered 2014-12-21: 50 mg via ORAL
  Filled 2014-12-21 (×2): qty 1

## 2014-12-21 MED ORDER — PHENOL 1.4 % MT LIQD: 1.0000 | OROMUCOSAL | Status: DC | PRN

## 2014-12-21 MED ORDER — OMEGA-3-ACID ETHYL ESTERS 1 G PO CAPS
1.0000 g | ORAL_CAPSULE | Freq: Every day | ORAL | Status: DC
  Administered 2014-12-21: 1 g via ORAL
  Filled 2014-12-21 (×2): qty 1

## 2014-12-21 MED ORDER — ONDANSETRON HCL 4 MG/2ML IJ SOLN: 4.0000 mg | Freq: Once | INTRAMUSCULAR | Status: DC | PRN

## 2014-12-21 MED ORDER — CHOLESTYRAMINE 4 G PO PACK
4.0000 g | PACK | Freq: Two times a day (BID) | ORAL | Status: DC
  Administered 2014-12-21: 4 g via ORAL
  Filled 2014-12-21 (×3): qty 1

## 2014-12-21 MED ORDER — ALUM & MAG HYDROXIDE-SIMETH 200-200-20 MG/5ML PO SUSP: 30.0000 mL | Freq: Four times a day (QID) | ORAL | Status: DC | PRN

## 2014-12-21 MED ORDER — ONDANSETRON HCL 4 MG PO TABS: 8.0000 mg | ORAL_TABLET | Freq: Three times a day (TID) | ORAL | Status: DC | PRN

## 2014-12-21 MED ORDER — MENTHOL 3 MG MT LOZG: 1.0000 | LOZENGE | OROMUCOSAL | Status: DC | PRN

## 2014-12-21 MED ORDER — ROCURONIUM BROMIDE 50 MG/5ML IV SOLN
INTRAVENOUS | Status: AC
  Filled 2014-12-21: qty 1

## 2014-12-21 MED ORDER — IRBESARTAN 75 MG PO TABS
75.0000 mg | ORAL_TABLET | Freq: Every day | ORAL | Status: DC
  Administered 2014-12-21: 75 mg via ORAL
  Filled 2014-12-21 (×3): qty 1

## 2014-12-21 MED ORDER — METHOCARBAMOL 500 MG PO TABS
500.0000 mg | ORAL_TABLET | Freq: Three times a day (TID) | ORAL | Status: DC | PRN
  Administered 2014-12-21: 500 mg via ORAL
  Filled 2014-12-21: qty 1

## 2014-12-21 MED ORDER — MORPHINE SULFATE 2 MG/ML IJ SOLN: 1.0000 mg | INTRAMUSCULAR | Status: DC | PRN

## 2014-12-21 MED ORDER — DIAZEPAM 5 MG PO TABS: 5.0000 mg | ORAL_TABLET | Freq: Four times a day (QID) | ORAL | Status: DC | PRN

## 2014-12-21 MED ORDER — 0.9 % SODIUM CHLORIDE (POUR BTL) OPTIME
TOPICAL | Status: DC | PRN
  Administered 2014-12-21: 1000 mL

## 2014-12-21 MED ORDER — LIDOCAINE HCL (CARDIAC) 20 MG/ML IV SOLN
INTRAVENOUS | Status: AC
  Filled 2014-12-21: qty 5

## 2014-12-21 MED ORDER — PANTOPRAZOLE SODIUM 40 MG PO TBEC
80.0000 mg | DELAYED_RELEASE_TABLET | Freq: Every day | ORAL | Status: DC
  Administered 2014-12-21: 80 mg via ORAL
  Filled 2014-12-21: qty 2

## 2014-12-21 MED ORDER — PRAVASTATIN SODIUM 20 MG PO TABS
20.0000 mg | ORAL_TABLET | Freq: Every day | ORAL | Status: DC
  Administered 2014-12-21: 20 mg via ORAL
  Filled 2014-12-21 (×2): qty 1

## 2014-12-21 MED ORDER — BUPROPION HCL ER (SR) 150 MG PO TB12
150.0000 mg | ORAL_TABLET | Freq: Every day | ORAL | Status: DC
  Filled 2014-12-21: qty 1

## 2014-12-21 MED ORDER — LACTATED RINGERS IV SOLN: INTRAVENOUS | Status: DC

## 2014-12-21 MED ORDER — POTASSIUM CHLORIDE CRYS ER 20 MEQ PO TBCR
20.0000 meq | EXTENDED_RELEASE_TABLET | Freq: Two times a day (BID) | ORAL | Status: DC
  Administered 2014-12-21: 20 meq via ORAL
  Filled 2014-12-21 (×3): qty 1

## 2014-12-21 MED ORDER — CITALOPRAM HYDROBROMIDE 40 MG PO TABS
40.0000 mg | ORAL_TABLET | Freq: Every evening | ORAL | Status: DC
  Administered 2014-12-21: 40 mg via ORAL
  Filled 2014-12-21 (×2): qty 1

## 2014-12-21 MED ORDER — LACTATED RINGERS IV SOLN
INTRAVENOUS | Status: DC
  Administered 2014-12-21: 12:00:00 via INTRAVENOUS

## 2014-12-21 MED ORDER — LORATADINE 10 MG PO TABS
10.0000 mg | ORAL_TABLET | Freq: Every day | ORAL | Status: DC
  Filled 2014-12-21: qty 1

## 2014-12-21 MED ORDER — ONDANSETRON HCL 4 MG/2ML IJ SOLN
INTRAMUSCULAR | Status: AC
  Filled 2014-12-21: qty 2

## 2014-12-21 MED ORDER — SUCCINYLCHOLINE CHLORIDE 20 MG/ML IJ SOLN
INTRAMUSCULAR | Status: DC | PRN
  Administered 2014-12-21: 120 mg via INTRAVENOUS

## 2014-12-21 MED ORDER — HYDROCODONE-ACETAMINOPHEN 5-325 MG PO TABS: 1.0000 | ORAL_TABLET | ORAL | Status: DC | PRN

## 2014-12-21 MED ORDER — HYDROCODONE-ACETAMINOPHEN 5-325 MG PO TABS
1.0000 | ORAL_TABLET | Freq: Four times a day (QID) | ORAL | Status: DC | PRN
  Administered 2014-12-21: 1 via ORAL
  Filled 2014-12-21: qty 1

## 2014-12-21 MED ORDER — PROPOFOL 10 MG/ML IV BOLUS
INTRAVENOUS | Status: DC | PRN
  Administered 2014-12-21: 200 mg via INTRAVENOUS

## 2014-12-21 MED ORDER — HYDROCHLOROTHIAZIDE 12.5 MG PO CAPS
12.5000 mg | ORAL_CAPSULE | Freq: Every day | ORAL | Status: DC
  Administered 2014-12-21: 12.5 mg via ORAL
  Filled 2014-12-21 (×2): qty 1

## 2014-12-21 MED ORDER — IOHEXOL 300 MG/ML  SOLN
INTRAMUSCULAR | Status: DC | PRN
  Administered 2014-12-21: 50 mL via INTRAVENOUS

## 2014-12-21 SURGICAL SUPPLY — 45 items
APL SKNCLS STERI-STRIP NONHPOA (GAUZE/BANDAGES/DRESSINGS) ×1
BENZOIN TINCTURE PRP APPL 2/3 (GAUZE/BANDAGES/DRESSINGS) ×3 IMPLANT
BLADE CLIPPER SURG (BLADE) IMPLANT
BLADE SURG 15 STRL LF DISP TIS (BLADE) ×1 IMPLANT
BLADE SURG 15 STRL SS (BLADE) ×3
CEMENT KYPHON CX01A KIT/MIXER (Cement) ×2 IMPLANT
CLOSURE WOUND 1/2 X4 (GAUZE/BANDAGES/DRESSINGS) ×1
CLOSURE WOUND 1/4X4 (GAUZE/BANDAGES/DRESSINGS) ×1
CONT SPEC 4OZ CLIKSEAL STRL BL (MISCELLANEOUS) ×3 IMPLANT
DRAPE C-ARM 42X72 X-RAY (DRAPES) ×3 IMPLANT
DRAPE INCISE IOBAN 66X45 STRL (DRAPES) ×3 IMPLANT
DRAPE LAPAROTOMY 100X72X124 (DRAPES) ×3 IMPLANT
DRAPE PROXIMA HALF (DRAPES) ×3 IMPLANT
DRAPE SURG 17X23 STRL (DRAPES) ×12 IMPLANT
GAUZE SPONGE 4X4 12PLY STRL (GAUZE/BANDAGES/DRESSINGS) ×3 IMPLANT
GAUZE SPONGE 4X4 16PLY XRAY LF (GAUZE/BANDAGES/DRESSINGS) ×3 IMPLANT
GLOVE BIO SURGEON STRL SZ8.5 (GLOVE) ×3 IMPLANT
GLOVE BIOGEL PI IND STRL 7.0 (GLOVE) IMPLANT
GLOVE BIOGEL PI INDICATOR 7.0 (GLOVE) ×2
GLOVE EXAM NITRILE LRG STRL (GLOVE) IMPLANT
GLOVE EXAM NITRILE MD LF STRL (GLOVE) IMPLANT
GLOVE EXAM NITRILE XL STR (GLOVE) IMPLANT
GLOVE EXAM NITRILE XS STR PU (GLOVE) IMPLANT
GLOVE INDICATOR 7.5 STRL GRN (GLOVE) ×2 IMPLANT
GLOVE SS BIOGEL STRL SZ 8 (GLOVE) ×1 IMPLANT
GLOVE SUPERSENSE BIOGEL SZ 8 (GLOVE) ×2
GOWN STRL REUS W/ TWL LRG LVL3 (GOWN DISPOSABLE) IMPLANT
GOWN STRL REUS W/ TWL XL LVL3 (GOWN DISPOSABLE) IMPLANT
GOWN STRL REUS W/TWL LRG LVL3 (GOWN DISPOSABLE)
GOWN STRL REUS W/TWL XL LVL3 (GOWN DISPOSABLE)
KIT BASIN OR (CUSTOM PROCEDURE TRAY) ×3 IMPLANT
KIT ROOM TURNOVER OR (KITS) ×3 IMPLANT
NEEDLE HYPO 22GX1.5 SAFETY (NEEDLE) ×3 IMPLANT
NS IRRIG 1000ML POUR BTL (IV SOLUTION) ×3 IMPLANT
PACK EENT II TURBAN DRAPE (CUSTOM PROCEDURE TRAY) ×3 IMPLANT
PAD ARMBOARD 7.5X6 YLW CONV (MISCELLANEOUS) ×9 IMPLANT
SPECIMEN JAR SMALL (MISCELLANEOUS) IMPLANT
STRIP CLOSURE SKIN 1/2X4 (GAUZE/BANDAGES/DRESSINGS) ×2 IMPLANT
STRIP CLOSURE SKIN 1/4X4 (GAUZE/BANDAGES/DRESSINGS) ×1 IMPLANT
SUT VIC AB 3-0 SH 8-18 (SUTURE) ×3 IMPLANT
SYR CONTROL 10ML LL (SYRINGE) ×3 IMPLANT
TAPE CLOTH SURG 4X10 WHT LF (GAUZE/BANDAGES/DRESSINGS) ×2 IMPLANT
TOWEL OR 17X24 6PK STRL BLUE (TOWEL DISPOSABLE) ×3 IMPLANT
TOWEL OR 17X26 10 PK STRL BLUE (TOWEL DISPOSABLE) ×3 IMPLANT
TRAY KYPHOPAK 20/3 ONESTEP 1ST (MISCELLANEOUS) ×2 IMPLANT

## 2014-12-21 NOTE — Progress Notes (Signed)
ANTIBIOTIC CONSULT NOTE - INITIAL  Pharmacy Consult for Vancomycin Indication: surgical prophylaxsis  Allergies  Allergen Reactions  . Penicillins Itching  . Sulfa Antibiotics Swelling    Patient Measurements:     Vital Signs: Temp: 98.4 F (36.9 C) (12/28 1732) Temp Source: Oral (12/28 1149) BP: 146/69 mmHg (12/28 1732) Pulse Rate: 90 (12/28 1732) Intake/Output from previous day:   Intake/Output from this shift: Total I/O In: 600 [I.V.:600] Out: -   Labs: No results for input(s): WBC, HGB, PLT, LABCREA, CREATININE in the last 72 hours. Estimated Creatinine Clearance: 70.8 mL/min (by C-G formula based on Cr of 0.55). No results for input(s): VANCOTROUGH, VANCOPEAK, VANCORANDOM, GENTTROUGH, GENTPEAK, GENTRANDOM, TOBRATROUGH, TOBRAPEAK, TOBRARND, AMIKACINPEAK, AMIKACINTROU, AMIKACIN in the last 72 hours.   Microbiology: Recent Results (from the past 720 hour(s))  Surgical pcr screen     Status: None   Collection Time: 12/08/14 10:21 AM  Result Value Ref Range Status   MRSA, PCR NEGATIVE NEGATIVE Final   Staphylococcus aureus NEGATIVE NEGATIVE Final    Comment:        The Xpert SA Assay (FDA approved for NASAL specimens in patients over 4 years of age), is one component of a comprehensive surveillance program.  Test performance has been validated by [employer] for patients greater than or equal to 25 year old. It is not intended to diagnose infection nor to guide or monitor treatment.     Medical History: Past Medical History  Diagnosis Date  . H/O hiatal hernia   . Chronic back pain   . Thyroid disease     takes Synthroid daily  . Allergy     SEASONAL-flonase daily as well as Claritin  . Arthritis     BACK/KNEE  . Depression     takes Wellbutrin daily  . Anxiety     takes Celexa every evening  . Anemia     takes Ferrous Sulfate daily  . Muscle spasm     takes Robaxin daily as needed  . Nausea     takes Zofran daily as needed  .  Hyperlipidemia     takes Pravastatin daily as well as Questran  . GERD (gastroesophageal reflux disease)     takes Omeprazole daily and Zantac at bedtime  . Insomnia     takes trazodone nightly  . Hypertension     takes Diovan daily  . Sleep apnea     CPAP-SLEEP STUDY DONE > 5 YRS AGO  . Headache     OCCASIONALLY  . Fibromyalgia   . Joint pain   . Chronic back pain     L1 FRACTURE  . History of colon polyps   . Cataract     IMMATURE AND ON LEFT EYE SHE THINKS  . Numbness and tingling     LEGS  . Nocturia   . History of shingles     Medications:  Prescriptions prior to admission  Medication Sig Dispense Refill Last Dose  . aspirin 81 MG tablet Take 81 mg by mouth daily.   12/20  . buPROPion (WELLBUTRIN SR) 150 MG 12 hr tablet Take 150 mg by mouth daily.   12/21/2014 at Unknown time  . citalopram (CELEXA) 40 MG tablet Take 40 mg by mouth every evening.    12/20  . Ferrous Sulfate (IRON) 325 (65 FE) MG TABS Take 1 tablet by mouth daily.   12/20  . fluticasone (FLONASE) 50 MCG/ACT nasal spray Place 2 sprays into the nose daily.   12/21/2014 at  Unknown time  . HYDROcodone-acetaminophen (NORCO/VICODIN) 5-325 MG per tablet Take 1 tablet by mouth every 6 (six) hours as needed for moderate pain.    12/20/2014 at Unknown time  . levothyroxine (SYNTHROID, LEVOTHROID) 100 MCG tablet Take 100 mcg by mouth daily before breakfast.   12/21/2014 at Unknown time  . loratadine (CLARITIN) 10 MG tablet Take 10 mg by mouth daily.   12/21/2014 at Unknown time  . methocarbamol (ROBAXIN) 500 MG tablet Take 500 mg by mouth every 8 (eight) hours as needed for muscle spasms.   Past Month at Unknown time  . omega-3 acid ethyl esters (LOVAZA) 1 G capsule Take 1 g by mouth daily.   12/20  . omeprazole (PRILOSEC) 40 MG capsule Take 1 capsule (40 mg total) by mouth daily before breakfast. 30 capsule 11 12/21/2014 at Unknown time  . OVER THE COUNTER MEDICATION Take 600 mg by mouth daily. Calcium + D OTC 600mg   one a day   12/20  . potassium chloride SA (K-DUR,KLOR-CON) 20 MEQ tablet Take 20 mEq by mouth 2 (two) times daily.   12/20  . pravastatin (PRAVACHOL) 20 MG tablet Take 20 mg by mouth at bedtime.    12/20/2014 at Unknown time  . ranitidine (ZANTAC) 75 MG tablet Take 75 mg by mouth at bedtime.   12/20/2014 at Unknown time  . traZODone (DESYREL) 50 MG tablet Take 50 mg by mouth at bedtime.   12/20/2014 at Unknown time  . valsartan-hydrochlorothiazide (DIOVAN-HCT) 80-12.5 MG per tablet Take 1 tablet by mouth daily.   12/20/2014 at Unknown time  . cholestyramine (QUESTRAN) 4 G packet 1 packet in juice or water once daily. Take 2 hours away from other medications. 30 each 1 More than a month at Unknown time  . ondansetron (ZOFRAN) 8 MG tablet Take 1 tablet (8 mg total) by mouth every 4 (four) hours as needed. 10 tablet 1 More than a month at Unknown time  . oxyCODONE-acetaminophen (PERCOCET) 5-325 MG per tablet Take 1-2 tablets by mouth every 4 (four) hours as needed. 40 tablet 0 More than a month at Unknown time   Assessment: 68 yo F admitted 12/21/2014  S/p L1 kyphoplasty.  Pharmacy consulted to provide one dose of vancomycin 12h post op.  Goal of Therapy:  Vancomycin trough level 15-20 mcg/ml  Plan:  1. Vancomcyin 750 mg IV x 1 at 3am 2. Pharmacy will sign off please call if questions.  Thank you for allowing pharmacy to be a part of this patients care team.  Rowe Robert Pharm.D., BCPS, AQ-Cardiology Clinical Pharmacist 12/21/2014 6:06 PM Pager: [phone] Phone: [phone]

## 2014-12-21 NOTE — Op Note (Signed)
Brief history: The patient is a 69 year old white female who fell in September of fraying and L1 compression fracture as well as rib fractures. She has continued to have back pain. She was worked up with a lumbar MRI which confirmed she had an L1 compression fracture. I discussed the various treatment options with the patient including surgery. She has weighed the risks, benefits, and alternative surgery and decided to proceed with a L1 kyphoplasty.  Preoperative diagnosis: L1 compression fracture, lumbago  Postop diagnosis: Same  Procedure: L1 kyphoplasty  Surgeon: Dr. Earle Gell  Assistant: None  Anesthesia: Gen. endotracheal  Estimated blood loss: Minimal  Specimens: None  Drains: None  Complications: None  Description of procedure: The patient was brought to the operating room by the anesthesia team. General endotracheal anesthesia was induced. The patient was turned to the prone position on the chest rolls. The patient's thoracolumbar region was then prepared with Betadine scrub and Betadine solution. Sterile drapes were applied. I injected the area to be incised with Marcaine with epinephrine solution. I made small incisions over the bilateral L1 pedicles. Under biplanar fluoroscopic guidance I cannulated the bilateral L1 pedicles with a Jamshidi needle. I inserted the Jamshidi needle into the vertebral body. I then drilled and then removed the drill. I inserted the balloon which I inflated and deflated bilaterally. We then filled in the cavity created by the balloon with bone cement under biplanar fluoroscopy. After I was satisfied with the kyphoplasty I removed the needles. I reapproximated the patient's subcutaneous tissue with interrupted 3-0 Vicryl suture. I reapproximated the skin with Steri-Strips and benzoin. The wounds were then coated with bacitracin ointment. A sterile dressing was applied. The drapes were removed. The patient was subsequently returned to the supine position.  She was then extubated by the anesthesia team and transported to the post anesthesia care unit in stable condition. By report all sponge, instrument, and needle counts were correct at the end this case.

## 2014-12-21 NOTE — Anesthesia Postprocedure Evaluation (Signed)
  Anesthesia Post-op Note  Patient: [name]  Procedure(s) Performed: Procedure(s) with comments: KYPHOPLASTY LUMBAR ONE (N/A) - L1 Kyphoplasty  Patient Location: PACU  Anesthesia Type:General  Level of Consciousness: awake, alert , oriented and patient cooperative  Airway and Oxygen Therapy: Patient Spontanous Breathing  Post-op Pain: mild  Post-op Assessment: Post-op Vital signs reviewed, Patient's Cardiovascular Status Stable, Respiratory Function Stable, Patent Airway, No signs of Nausea or vomiting and Pain level controlled  Post-op Vital Signs: stable  Last Vitals:  Filed Vitals:   12/21/14 1202  BP: 172/96  Pulse: 90  Temp: 36.8 C  Resp: 20    Complications: No apparent anesthesia complications

## 2014-12-21 NOTE — Transfer of Care (Signed)
Immediate Anesthesia Transfer of Care Note  Patient: [name]  Procedure(s) Performed: Procedure(s) with comments: KYPHOPLASTY LUMBAR ONE (N/A) - L1 Kyphoplasty  Patient Location: PACU  Anesthesia Type:General  Level of Consciousness: awake, alert  and oriented  Airway & Oxygen Therapy: Patient Spontanous Breathing  Post-op Assessment: Report given to PACU RN  Post vital signs: Reviewed and stable  Complications: No apparent anesthesia complications

## 2014-12-21 NOTE — Progress Notes (Signed)
Subjective:  The patient is alert and pleasant. She looks well. She is in no apparent distress.  Objective: Vital signs in last 24 hours: Temp:  [98.2 F (36.8 C)] 98.2 F (36.8 C) (12/28 1202) Pulse Rate:  [90-95] 95 (12/28 1545) Resp:  [20] 20 (12/28 1202) BP: (156-172)/(68-96) 156/68 mmHg (12/28 1545) SpO2:  [94 %-95 %] 95 % (12/28 1545)  Intake/Output from previous day:   Intake/Output this shift: Total I/O In: 600 [I.V.:600] Out: -   Physical exam the patient is alert and pleasant. She is moving her lower extremities well.  Lab Results: No results for input(s): WBC, HGB, HCT, PLT in the last 72 hours. BMET No results for input(s): NA, K, CL, CO2, GLUCOSE, BUN, CREATININE, CALCIUM in the last 72 hours.  Studies/Results: No results found.  Assessment/Plan: The patient is doing well. She will likely go home tomorrow.  LOS: 0 days     Estefania Kamiya D 12/21/2014, 4:16 PM

## 2014-12-21 NOTE — Progress Notes (Addendum)
Patient requesting pain medication.  Still has 1 hr plus to wait.  Spoke with Dr. Redmond School, new order given for IV Fentanyl. Oxygen via [facility] up and running.  Pulse ox probe on.    Only initially give 25 mcg of Fentanyl.  Patient stated she "still can feel its there".  Additional IV pain med given.  DA

## 2014-12-21 NOTE — Anesthesia Preprocedure Evaluation (Signed)
Anesthesia Evaluation  Patient identified by MRN, date of birth, ID band Patient awake    Reviewed: Allergy & Precautions, H&P , NPO status , Patient's Chart, lab work & pertinent test results  Airway        Dental   Pulmonary sleep apnea ,          Cardiovascular hypertension,     Neuro/Psych  Headaches,  Neuromuscular disease    GI/Hepatic hiatal hernia, GERD-  ,  Endo/Other  Morbid obesity  Renal/GU      Musculoskeletal  (+) Arthritis -, Fibromyalgia -  Abdominal   Peds  Hematology  (+) anemia ,   Anesthesia Other Findings   Reproductive/Obstetrics                             Anesthesia Physical Anesthesia Plan  ASA: III  Anesthesia Plan: General   Post-op Pain Management:    Induction: Intravenous  Airway Management Planned: Oral ETT  Additional Equipment:   Intra-op Plan:   Post-operative Plan: Extubation in OR  Informed Consent: I have reviewed the patients History and Physical, chart, labs and discussed the procedure including the risks, benefits and alternatives for the proposed anesthesia with the patient or authorized representative who has indicated his/her understanding and acceptance.     Plan Discussed with: CRNA, Anesthesiologist and Surgeon  Anesthesia Plan Comments:         Anesthesia Quick Evaluation

## 2014-12-21 NOTE — H&P (Signed)
Subjective: The patient is a 68 year old white female who fell and suffered an L1 compression fracture. She has failed medical management and was worked up with lumbar x-rays and lumbar MRI. I discussed the various treatment options including a L1 kyphoplasty. The patient has decided to proceed with surgery.   Past Medical History  Diagnosis Date  . H/O hiatal hernia   . Chronic back pain   . Thyroid disease     takes Synthroid daily  . Allergy     SEASONAL-flonase daily as well as Claritin  . Arthritis     BACK/KNEE  . Depression     takes Wellbutrin daily  . Anxiety     takes Celexa every evening  . Anemia     takes Ferrous Sulfate daily  . Muscle spasm     takes Robaxin daily as needed  . Nausea     takes Zofran daily as needed  . Hyperlipidemia     takes Pravastatin daily as well as Questran  . GERD (gastroesophageal reflux disease)     takes Omeprazole daily and Zantac at bedtime  . Insomnia     takes trazodone nightly  . Hypertension     takes Diovan daily  . Sleep apnea     CPAP-SLEEP STUDY DONE > 5 YRS AGO  . Headache     OCCASIONALLY  . Fibromyalgia   . Joint pain   . Chronic back pain     L1 FRACTURE  . History of colon polyps   . Cataract     IMMATURE AND ON LEFT EYE SHE THINKS  . Numbness and tingling     LEGS  . Nocturia   . History of shingles     Past Surgical History  Procedure Laterality Date  . Abdominal hysterectomy    . Cholecystectomy    . Tonsillectomy    . Rectocele repair    . 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 Colonoscopy    . Upper gastrointestinal endoscopy  02/2013  . Carpal tunnel release      right and lt  . Fracture surgery      rt hand  . Fracture surgery      lr wrist  . Orif finger fracture  04/11/2012    Procedure: OPEN REDUCTION INTERNAL FIXATION (ORIF) METACARPAL (FINGER) FRACTURE;  Surgeon: Ninetta Lights, MD;  Location: [facility];  Service: Orthopedics;  Laterality: Left;  orif  left hand 4th metacarpal   . Hand surgery Right 06/1985  . Hand surgery Left   . Wrist surgery Left   . Knee arthroscopy Left   . Foot surgery Bilateral   . Cardiac catheterization    . Colonoscopy      Allergies  Allergen Reactions  . Penicillins Itching  . Sulfa Antibiotics Swelling    History  Substance Use Topics  . Smoking status: Never Smoker   . Smokeless tobacco: Never Used  . Alcohol Use: No    Family History  Problem Relation Age of Onset  . Hypertension Mother   . Diabetes Mother   . Cancer Maternal Grandfather     back  . Cancer Paternal Uncle   . Lung cancer Paternal Aunt   . Lung cancer Sister   . Heart attack Brother     x 2   Prior to Admission medications   Medication Sig Start Date End Date Taking? Authorizing Provider  aspirin 81 MG tablet Take 81 mg by mouth  daily.   Yes Historical Provider, MD  buPROPion (WELLBUTRIN SR) 150 MG 12 hr tablet Take 150 mg by mouth daily.   Yes Historical Provider, MD  citalopram (CELEXA) 40 MG tablet Take 40 mg by mouth every evening.    Yes Historical Provider, MD  Ferrous Sulfate (IRON) 325 (65 FE) MG TABS Take 1 tablet by mouth daily.   Yes Historical Provider, MD  fluticasone (FLONASE) 50 MCG/ACT nasal spray Place 2 sprays into the nose daily.   Yes Historical Provider, MD  HYDROcodone-acetaminophen (NORCO/VICODIN) 5-325 MG per tablet Take 1 tablet by mouth every 6 (six) hours as needed for moderate pain.  08/20/12  Yes Historical Provider, MD  levothyroxine (SYNTHROID, LEVOTHROID) 100 MCG tablet Take 100 mcg by mouth daily before breakfast.   Yes Historical Provider, MD  loratadine (CLARITIN) 10 MG tablet Take 10 mg by mouth daily.   Yes Historical Provider, MD  methocarbamol (ROBAXIN) 500 MG tablet Take 500 mg by mouth every 8 (eight) hours as needed for muscle spasms.   Yes Historical Provider, MD  omega-3 acid ethyl esters (LOVAZA) 1 G capsule Take 1 g by mouth daily.   Yes Historical Provider, MD  omeprazole  (PRILOSEC) 40 MG capsule Take 1 capsule (40 mg total) by mouth daily before breakfast. 01/07/13  Yes Milus Banister, MD  OVER THE COUNTER MEDICATION Take 600 mg by mouth daily. Calcium + D OTC 600mg  one a day   Yes Historical Provider, MD  potassium chloride SA (K-DUR,KLOR-CON) 20 MEQ tablet Take 20 mEq by mouth 2 (two) times daily.   Yes Historical Provider, MD  pravastatin (PRAVACHOL) 20 MG tablet Take 20 mg by mouth at bedtime.    Yes Historical Provider, MD  ranitidine (ZANTAC) 75 MG tablet Take 75 mg by mouth at bedtime.   Yes Historical Provider, MD  traZODone (DESYREL) 50 MG tablet Take 50 mg by mouth at bedtime.   Yes Historical Provider, MD  valsartan-hydrochlorothiazide (DIOVAN-HCT) 80-12.5 MG per tablet Take 1 tablet by mouth daily.   Yes Historical Provider, MD  cholestyramine Lucrezia Starch) 4 G packet 1 packet in juice or water once daily. Take 2 hours away from other medications. 03/07/13   Amy S Esterwood, PA-C  ondansetron (ZOFRAN) 8 MG tablet Take 1 tablet (8 mg total) by mouth every 4 (four) hours as needed. 09/05/14   Nat Christen, MD  oxyCODONE-acetaminophen (PERCOCET) 5-325 MG per tablet Take 1-2 tablets by mouth every 4 (four) hours as needed. 09/05/14   Nat Christen, MD     Review of Systems  Positive ROS: As above  All other systems have been reviewed and were otherwise negative with the exception of those mentioned in the HPI and as above.  Objective: Vital signs in last 24 hours: Temp:  [98.2 F (36.8 C)] 98.2 F (36.8 C) (12/28 1202) Pulse Rate:  [90] 90 (12/28 1202) Resp:  [20] 20 (12/28 1202) BP: (172)/(96) 172/96 mmHg (12/28 1202) SpO2:  [94 %] 94 % (12/28 1202)  General Appearance: Alert, cooperative, no distress, Head: Normocephalic, without obvious abnormality, atraumatic Eyes: PERRL, conjunctiva/corneas clear, EOM's intact,    Ears: Normal  Throat: Normal  Neck: Supple, symmetrical, trachea midline, no adenopathy; thyroid: No enlargement/tenderness/nodules; no  carotid bruit or JVD Back: Symmetric, no curvature, ROM normal, no CVA tenderness Lungs: Clear to auscultation bilaterally, respirations unlabored Heart: Regular rate and rhythm, no murmur, rub or gallop Abdomen: Soft, non-tender,, no masses, no organomegaly Extremities: Extremities normal, atraumatic, no cyanosis or edema Pulses: 2+  and symmetric all extremities Skin: Skin color, texture, turgor normal, no rashes or lesions  NEUROLOGIC:   Mental status: alert and oriented, no aphasia, good attention span, Fund of knowledge/ memory ok Motor Exam - grossly normal Sensory Exam - grossly normal Reflexes:  Coordination - grossly normal Gait - grossly normal Balance - grossly normal Cranial Nerves: I: smell Not tested  II: visual acuity  OS: Normal  OD: Normal   II: visual fields Full to confrontation  II: pupils Equal, round, reactive to light  III,VII: ptosis None  III,IV,VI: extraocular muscles  Full ROM  V: mastication Normal  V: facial light touch sensation  Normal  V,VII: corneal reflex  Present  VII: facial muscle function - upper  Normal  VII: facial muscle function - lower Normal  VIII: hearing Not tested  IX: soft palate elevation  Normal  IX,X: gag reflex Present  XI: trapezius strength  5/5  XI: sternocleidomastoid strength 5/5  XI: neck flexion strength  5/5  XII: tongue strength  Normal    Data Review Lab Results  Component Value Date   WBC 6.9 12/08/2014   HGB 14.7 12/08/2014   HCT 43.2 12/08/2014   MCV 89.6 12/08/2014   PLT 182 12/08/2014   Lab Results  Component Value Date   NA 143 12/08/2014   K 3.9 12/08/2014   CL 105 12/08/2014   CO2 26 12/08/2014   BUN 8 12/08/2014   CREATININE 0.55 12/08/2014   GLUCOSE 83 12/08/2014   No results found for: INR, PROTIME  Assessment/Plan: L1 fracture, lumbago: I have discussed the situation with the patient. I have reviewed her imaging studies with her and pointed out the abnormalities. We have discussed the  various treatment options including a L1 kyphoplasty. I described the surgery to her. I have shown her surgical models. We have discussed the risks, benefits, alternatives, and likelihood of achieving her goals with surgery. I have answered all patient's questions. She has decided to proceed with surgery.   Zynia Wojtowicz D 12/21/2014 2:13 PM

## 2014-12-22 ENCOUNTER — Encounter (HOSPITAL_COMMUNITY): Payer: Self-pay | Admitting: Neurosurgery

## 2014-12-22 ENCOUNTER — Ambulatory Visit (HOSPITAL_COMMUNITY): Admission: RE | Admit: 2014-12-22 | Payer: No Typology Code available for payment source | Source: Ambulatory Visit

## 2014-12-22 MED ORDER — DSS 100 MG PO CAPS: 100.0000 mg | ORAL_CAPSULE | Freq: Two times a day (BID) | ORAL | Status: DC

## 2014-12-22 MED ORDER — DIAZEPAM 5 MG PO TABS: 5.0000 mg | ORAL_TABLET | Freq: Four times a day (QID) | ORAL | Status: DC | PRN

## 2014-12-22 MED ORDER — OXYCODONE-ACETAMINOPHEN 5-325 MG PO TABS: 1.0000 | ORAL_TABLET | ORAL | Status: DC | PRN

## 2014-12-22 NOTE — Discharge Summary (Signed)
Physician Discharge Summary  Patient ID: [name] MRN: [id number] DOB/AGE: [birth year] 68 y.o.  Admit date: 12/21/2014 Discharge date: 12/22/2014  Admission Diagnoses: L1 compression fracture, lumbago  Discharge Diagnoses: The same Active Problems:   Lumbar compression fracture   Discharged Condition: good  Hospital Course: I performed an L1 kyphoplasty on the patient on 12/13/2014. The surgery went well.  The patient's postoperative course was unremarkable. On postoperative day #1 she requested discharge to home. She was given oral and written discharge instructions. All her, and her husbands, questions were answered.  Consults: None Significant Diagnostic Studies: None Treatments: L1 kyphoplasty Discharge Exam: Blood pressure 117/53, pulse 96, temperature 98.5 F (36.9 C), temperature source Oral, resp. rate 18, SpO2 92 %. The patient is alert and pleasant. Her dressing is clean and dry. Her lower extremity strength is normal.  Disposition: Home  Discharge Instructions    Call MD for:  difficulty breathing, headache or visual disturbances    Complete by:  As directed      Call MD for:  extreme fatigue    Complete by:  As directed      Call MD for:  hives    Complete by:  As directed      Call MD for:  persistant dizziness or light-headedness    Complete by:  As directed      Call MD for:  persistant nausea and vomiting    Complete by:  As directed      Call MD for:  redness, tenderness, or signs of infection (pain, swelling, redness, odor or green/yellow discharge around incision site)    Complete by:  As directed      Call MD for:  severe uncontrolled pain    Complete by:  As directed      Call MD for:  temperature >100.4    Complete by:  As directed      Diet - low sodium heart healthy    Complete by:  As directed      Discharge instructions    Complete by:  As directed   Call [phone] for a followup appointment. Take a stool softener while you are  using pain medications.     Driving Restrictions    Complete by:  As directed   Do not drive for 2 weeks.     Increase activity slowly    Complete by:  As directed      Lifting restrictions    Complete by:  As directed   Do not lift more than 5 pounds. No excessive bending or twisting.     May shower / Bathe    Complete by:  As directed   He may shower after the pain she is removed 3 days after surgery. Leave the incision alone.     Remove dressing in 48 hours    Complete by:  As directed   Your stitches are under the scan and will dissolve by themselves. The Steri-Strips will fall off after you take a few showers. Do not rub back or pick at the wound, Leave the wound alone.            Medication List    STOP taking these medications        HYDROcodone-acetaminophen 5-325 MG per tablet  Commonly known as:  NORCO/VICODIN     methocarbamol 500 MG tablet  Commonly known as:  ROBAXIN      TAKE these medications        aspirin 81 MG  tablet  Take 81 mg by mouth daily.     buPROPion 150 MG 12 hr tablet  Commonly known as:  WELLBUTRIN SR  Take 150 mg by mouth daily.     cholestyramine 4 G packet  Commonly known as:  QUESTRAN  - 1 packet in juice or water once daily.  - Take 2 hours away from other medications.     citalopram 40 MG tablet  Commonly known as:  CELEXA  Take 40 mg by mouth every evening.     diazepam 5 MG tablet  Commonly known as:  VALIUM  Take 1 tablet (5 mg total) by mouth every 6 (six) hours as needed for muscle spasms.     DSS 100 MG Caps  Take 100 mg by mouth 2 (two) times daily.     fluticasone 50 MCG/ACT nasal spray  Commonly known as:  FLONASE  Place 2 sprays into the nose daily.     Iron 325 (65 FE) MG Tabs  Take 1 tablet by mouth daily.     levothyroxine 100 MCG tablet  Commonly known as:  SYNTHROID, LEVOTHROID  Take 100 mcg by mouth daily before breakfast.     loratadine 10 MG tablet  Commonly known as:  CLARITIN  Take 10 mg by  mouth daily.     omega-3 acid ethyl esters 1 G capsule  Commonly known as:  LOVAZA  Take 1 g by mouth daily.     omeprazole 40 MG capsule  Commonly known as:  PRILOSEC  Take 1 capsule (40 mg total) by mouth daily before breakfast.     ondansetron 8 MG tablet  Commonly known as:  ZOFRAN  Take 1 tablet (8 mg total) by mouth every 4 (four) hours as needed.     OVER THE COUNTER MEDICATION  Take 600 mg by mouth daily. Calcium + D OTC 600mg  one a day     oxyCODONE-acetaminophen 5-325 MG per tablet  Commonly known as:  PERCOCET  Take 1-2 tablets by mouth every 4 (four) hours as needed.     oxyCODONE-acetaminophen 5-325 MG per tablet  Commonly known as:  PERCOCET/ROXICET  Take 1-2 tablets by mouth every 4 (four) hours as needed for moderate pain.     potassium chloride SA 20 MEQ tablet  Commonly known as:  K-DUR,KLOR-CON  Take 20 mEq by mouth 2 (two) times daily.     pravastatin 20 MG tablet  Commonly known as:  PRAVACHOL  Take 20 mg by mouth at bedtime.     ranitidine 75 MG tablet  Commonly known as:  ZANTAC  Take 75 mg by mouth at bedtime.     traZODone 50 MG tablet  Commonly known as:  DESYREL  Take 50 mg by mouth at bedtime.     valsartan-hydrochlorothiazide 80-12.5 MG per tablet  Commonly known as:  DIOVAN-HCT  Take 1 tablet by mouth daily.         SignedNewman Pies D 12/22/2014, 7:50 AM

## 2014-12-22 NOTE — Progress Notes (Signed)
Utilization review completed.  

## 2014-12-22 NOTE — Progress Notes (Signed)
Pt given D/C instructions with verbal understanding. Pt's incision is covered with gauze and had no sign of infection. Pt's IV was removed prior to D/C. Pt D/C'd home via wheelchair @ 0920 per MD order. Pt is stable @ D/C and has no other needs at this time. Holli Humbles, RN

## 2015-04-14 ENCOUNTER — Ambulatory Visit (HOSPITAL_COMMUNITY): Payer: No Typology Code available for payment source

## 2015-04-15 ENCOUNTER — Ambulatory Visit (HOSPITAL_COMMUNITY): Payer: Commercial Managed Care - HMO | Attending: Orthopedic Surgery | Admitting: Occupational Therapy

## 2015-04-15 ENCOUNTER — Encounter (HOSPITAL_COMMUNITY): Payer: Self-pay | Admitting: Occupational Therapy

## 2015-04-15 DIAGNOSIS — M25611 Stiffness of right shoulder, not elsewhere classified: Secondary | ICD-10-CM | POA: Insufficient documentation

## 2015-04-15 DIAGNOSIS — M6281 Muscle weakness (generalized): Secondary | ICD-10-CM | POA: Insufficient documentation

## 2015-04-15 DIAGNOSIS — M7541 Impingement syndrome of right shoulder: Secondary | ICD-10-CM

## 2015-04-15 DIAGNOSIS — M25511 Pain in right shoulder: Secondary | ICD-10-CM | POA: Diagnosis not present

## 2015-04-15 DIAGNOSIS — R29898 Other symptoms and signs involving the musculoskeletal system: Secondary | ICD-10-CM | POA: Insufficient documentation

## 2015-04-15 DIAGNOSIS — M629 Disorder of muscle, unspecified: Secondary | ICD-10-CM | POA: Insufficient documentation

## 2015-04-15 DIAGNOSIS — M6289 Other specified disorders of muscle: Secondary | ICD-10-CM

## 2015-04-15 NOTE — Therapy (Addendum)
[place], [place], [address] Phone: [phone]   Fax:  [phone]  Occupational Therapy Evaluation  Patient Details  Name: [name] MRN: [id number] Date of Birth: 01/08/1946 Referring Provider:  Kathryne Hitch, MD  Encounter Date: 04/15/2015      OT End of Session - 04/15/15 1519    Visit Number 1   Number of Visits 6   Date for OT Re-Evaluation 06/14/15  Mini-reassess on 05/06/15   OT Start Time 1430   OT Stop Time 1505   OT Time Calculation (min) 35 min   Activity Tolerance Patient tolerated treatment well   Behavior During Therapy [facility] for tasks assessed/performed      Past Medical History  Diagnosis Date  . H/O hiatal hernia   . Chronic back pain   . Thyroid disease     takes Synthroid daily  . Allergy     SEASONAL-flonase daily as well as Claritin  . Arthritis     BACK/KNEE  . Depression     takes Wellbutrin daily  . Anxiety     takes Celexa every evening  . Anemia     takes Ferrous Sulfate daily  . Muscle spasm     takes Robaxin daily as needed  . Nausea     takes Zofran daily as needed  . Hyperlipidemia     takes Pravastatin daily as well as Questran  . GERD (gastroesophageal reflux disease)     takes Omeprazole daily and Zantac at bedtime  . Insomnia     takes trazodone nightly  . Hypertension     takes Diovan daily  . Sleep apnea     CPAP-SLEEP STUDY DONE > 5 YRS AGO  . Headache     OCCASIONALLY  . Fibromyalgia   . Joint pain   . Chronic back pain     L1 FRACTURE  . History of colon polyps   . Cataract     IMMATURE AND ON LEFT EYE SHE THINKS  . Numbness and tingling     LEGS  . Nocturia   . History of shingles     Past Surgical History  Procedure Laterality Date  . Abdominal hysterectomy    . Cholecystectomy    . Tonsillectomy    . Rectocele repair    . 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 Colonoscopy    . Upper gastrointestinal  endoscopy  02/2013  . Carpal tunnel release      right and lt  . Fracture surgery      rt hand  . Fracture surgery      lr wrist  . Orif finger fracture  04/11/2012    Procedure: OPEN REDUCTION INTERNAL FIXATION (ORIF) METACARPAL (FINGER) FRACTURE;  Surgeon: Ninetta Lights, MD;  Location: [facility];  Service: Orthopedics;  Laterality: Left;  orif left hand 4th metacarpal   . Hand surgery Right 06/1985  . Hand surgery Left   . Wrist surgery Left   . Knee arthroscopy Left   . Foot surgery Bilateral   . Cardiac catheterization    . Colonoscopy    . Kyphoplasty N/A 12/21/2014    Procedure: KYPHOPLASTY LUMBAR ONE;  Surgeon: Newman Pies, MD;  Location: [place] [facility];  Service: Neurosurgery;  Laterality: N/A;  L1 Kyphoplasty    There were no vitals filed for this visit.  Visit Diagnosis:  Shoulder impingement, right  Pain in joint, shoulder region,  right  Tight fascia  Decreased range of motion of shoulder, right  Weakness of shoulder      Subjective Assessment - 04/15/15 1511    Subjective  S: I can do what I want to, it just hurts.    Pertinent History Pt is a 69 y/o female who fell in mid-March and presents to therapy with right shoulder impingement, anterior subluxation, and bankart lesion. Pt was referred to occupational therapy for evaluation and treatment by Dr. Kathryne Hitch.    Special Tests FOTO Score: 79/100 (21% impairment)   Patient Stated Goals To be able to fasten my bra.    Currently in Pain? Yes   Pain Score 4    Pain Location Shoulder   Pain Orientation Right   Pain Descriptors / Indicators Aching   Pain Type Acute pain           [facility] OT Assessment - 04/15/15 1429    Assessment   Diagnosis Right shoulder impingement, anterior subluxation, bankart lesion   Onset Date 03/09/15   Prior Therapy No   Precautions   Precautions None   Balance Screen   Has the patient fallen in the past 6 months Yes   How many times? 1   Has the patient  had a decrease in activity level because of a fear of falling?  No   Is the patient reluctant to leave their home because of a fear of falling?  No   Home  Environment   Family/patient expects to be discharged to: Private residence   Living Arrangements Spouse/significant other   Available Help at Discharge Available 24 hours/day   Type of Home House   Prior Function   Level of Independence Independent with basic ADLs   Vocation Retired   Leisure crafts-wreaths, Management consultant   ADL   ADL comments Pt has difficulty reaching behind her, reaching into high cabinets, lifting heavy objects, and with some meal preparation (lifting/pouring)   Written Expression   Dominant Hand Right   Vision - History   Baseline Vision Wears glasses all the time   Cognition   Overall Cognitive Status Within Functional Limits for tasks assessed   ROM / Strength   AROM / PROM / Strength AROM;PROM;Strength   Palpation   Palpation Pt has mod fascial restrictions in right upper arm, trapezius, and scapularis regions   AROM   Overall AROM Comments Assessed in standing, ER/IR adducted   AROM Assessment Site Shoulder   Right/Left Shoulder Right   Right Shoulder Flexion 142 Degrees   Right Shoulder ABduction 133 Degrees   Right Shoulder Internal Rotation 90 Degrees   Right Shoulder External Rotation 55 Degrees   PROM   Overall PROM Comments Assessed in supine, ER/IR adducted   PROM Assessment Site Shoulder   Right/Left Shoulder Right   Right Shoulder Flexion 152 Degrees   Right Shoulder ABduction 135 Degrees   Right Shoulder Internal Rotation 90 Degrees   Right Shoulder External Rotation 65 Degrees   Strength   Overall Strength Comments Assessed seated, ER/IR adducted   Right Shoulder Flexion 4/5   Right Shoulder Extension 4/5   Right Shoulder ABduction 4-/5   Right Shoulder Internal Rotation 4-/5                         OT Education - 04/15/15 1518    Education provided Yes    Education Details Shoulder stretches   Person(s) Educated Patient   Methods Explanation;Demonstration;Handout  Comprehension Verbalized understanding;Returned demonstration          OT Short Term Goals - 04/15/15 1525    OT SHORT TERM GOAL #1   Title Pt will be educated on HEP.    Time 6   Period Weeks   Status New   OT SHORT TERM GOAL #2   Title Pt will decrease pain to 2/10 or less during daily tasks.    Time 6   Period Weeks   Status New   OT SHORT TERM GOAL #3   Title Pt will decrease fascial restrictions to min amount or less.    Time 6   Period Weeks   Status New   OT SHORT TERM GOAL #4   Title Pt will increase range of motion to WNL to increase ability to fasten her bra.    Time 6   Period Weeks   Status New   OT SHORT TERM GOAL #5   Title Pt will increase strength to 4+/5 to increase ability to lift heavy objects from overhead shelves.    Time 6   Period Weeks   Status New                  Plan - 04/15/15 1520    Clinical Impression Statement A: Pt is a 69 y/o female s/p fall in March resulting in right shoulder impingement, anterior subluxation, and bankart lesion. Pt presents to therapy with increased pain and fascial restrictions, and decreased range of motion and strength, limiting pt's ability to complete daily tasks. Provided pt with shoulder stretch HEP.    Pt will benefit from skilled therapeutic intervention in order to improve on the following deficits (Retired) Decreased strength;Pain;Decreased activity tolerance;Impaired UE functional use;Decreased range of motion;Increased fascial restricitons   Rehab Potential Good   OT Frequency 1x / week   OT Duration 6 weeks   OT Treatment/Interventions Self-care/ADL training;Passive range of motion;Patient/family education;Electrical Stimulation;Moist Heat;Therapeutic exercise;Manual Therapy;Therapeutic activities   Plan P: Pt will benefit from skilled OT services to decrease pain, decrease fascial  restrictions, improve ROM, increase strength, and improve overall RUE functional use.Treatment Plan: myofascial release (MFR) and manual stretching, PROM, AROM,  proximal shoulder strengthening, general strengthening.       G-Codes: FOTO Score: 79/100 (21% impairment) Carrying, moving, & handling objects Current Status: CJ- At least 20% restricted but less than 40% restricted  Goal Status: CI- At least 1% restricted but less than 20% restricted  Problem List Patient Active Problem List   Diagnosis Date Noted  . Lumbar compression fracture 12/21/2014  . Morbid obesity 03/07/2013  . Obstructive sleep apnea 03/07/2013  . Fibromyalgia 03/07/2013  . Other and unspecified hyperlipidemia 03/07/2013  . Hx of gastritis 03/07/2013  . S/P cholecystectomy 03/07/2013  . Chest pain 01/07/2013    Guadelupe Sabin, OTR/L  [phone]  04/15/2015, 3:35 PM  [place], [place], [address] Phone: [phone]   Fax:  [phone]

## 2015-04-16 ENCOUNTER — Encounter (HOSPITAL_COMMUNITY): Payer: Self-pay | Admitting: Occupational Therapy

## 2015-04-16 ENCOUNTER — Ambulatory Visit (HOSPITAL_COMMUNITY): Payer: Commercial Managed Care - HMO | Admitting: Occupational Therapy

## 2015-04-16 DIAGNOSIS — M25511 Pain in right shoulder: Secondary | ICD-10-CM

## 2015-04-16 DIAGNOSIS — M629 Disorder of muscle, unspecified: Secondary | ICD-10-CM

## 2015-04-16 DIAGNOSIS — M25611 Stiffness of right shoulder, not elsewhere classified: Secondary | ICD-10-CM | POA: Diagnosis not present

## 2015-04-16 DIAGNOSIS — M6289 Other specified disorders of muscle: Secondary | ICD-10-CM

## 2015-04-16 DIAGNOSIS — R29898 Other symptoms and signs involving the musculoskeletal system: Secondary | ICD-10-CM

## 2015-04-16 NOTE — Therapy (Signed)
[place], [place], [address] Phone: [phone]   Fax:  [phone]  Occupational Therapy Treatment  Patient Details  Name: [name] MRN: [id number] Date of Birth: 1946-05-18 Referring Provider:  Kathryne Hitch, MD  Encounter Date: 04/16/2015      OT End of Session - 04/16/15 1516    Visit Number 2   Number of Visits 6   Date for OT Re-Evaluation 06/14/15  Mini-reassess on 05/06/15   OT Start Time 1434   OT Stop Time 1515   OT Time Calculation (min) 41 min   Activity Tolerance Patient tolerated treatment well   Behavior During Therapy [facility] for tasks assessed/performed      Past Medical History  Diagnosis Date  . H/O hiatal hernia   . Chronic back pain   . Thyroid disease     takes Synthroid daily  . Allergy     SEASONAL-flonase daily as well as Claritin  . Arthritis     BACK/KNEE  . Depression     takes Wellbutrin daily  . Anxiety     takes Celexa every evening  . Anemia     takes Ferrous Sulfate daily  . Muscle spasm     takes Robaxin daily as needed  . Nausea     takes Zofran daily as needed  . Hyperlipidemia     takes Pravastatin daily as well as Questran  . GERD (gastroesophageal reflux disease)     takes Omeprazole daily and Zantac at bedtime  . Insomnia     takes trazodone nightly  . Hypertension     takes Diovan daily  . Sleep apnea     CPAP-SLEEP STUDY DONE > 5 YRS AGO  . Headache     OCCASIONALLY  . Fibromyalgia   . Joint pain   . Chronic back pain     L1 FRACTURE  . History of colon polyps   . Cataract     IMMATURE AND ON LEFT EYE SHE THINKS  . Numbness and tingling     LEGS  . Nocturia   . History of shingles     Past Surgical History  Procedure Laterality Date  . Abdominal hysterectomy    . Cholecystectomy    . Tonsillectomy    . Rectocele repair    . 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 Colonoscopy    . Upper gastrointestinal endoscopy   02/2013  . Carpal tunnel release      right and lt  . Fracture surgery      rt hand  . Fracture surgery      lr wrist  . Orif finger fracture  04/11/2012    Procedure: OPEN REDUCTION INTERNAL FIXATION (ORIF) METACARPAL (FINGER) FRACTURE;  Surgeon: Ninetta Lights, MD;  Location: [facility];  Service: Orthopedics;  Laterality: Left;  orif left hand 4th metacarpal   . Hand surgery Right 06/1985  . Hand surgery Left   . Wrist surgery Left   . Knee arthroscopy Left   . Foot surgery Bilateral   . Cardiac catheterization    . Colonoscopy    . Kyphoplasty N/A 12/21/2014    Procedure: KYPHOPLASTY LUMBAR ONE;  Surgeon: Newman Pies, MD;  Location: [facility];  Service: Neurosurgery;  Laterality: N/A;  L1 Kyphoplasty    There were no vitals filed for this visit.  Visit Diagnosis:  Pain in joint, shoulder region, right  Tight fascia  Decreased range of motion of shoulder, right  Weakness of shoulder      Subjective Assessment - 04/16/15 1430    Subjective  S: It's always a nagging sensation but it doesn't hurt today.    Currently in Pain? No/denies            [facility] OT Assessment - 04/16/15 1520    Assessment   Diagnosis Right shoulder impingement, anterior subluxation, bankart lesion   Precautions   Precautions None                  OT Treatments/Exercises (OP) - 04/16/15 1502    Exercises   Exercises Shoulder   Shoulder Exercises: Supine   Protraction PROM;AROM;10 reps   Horizontal ABduction PROM;AROM;10 reps   External Rotation PROM;AROM;10 reps   Internal Rotation PROM;AROM;10 reps   Flexion PROM;AROM;10 reps   ABduction PROM;AROM;10 reps   Shoulder Exercises: Standing   Protraction AROM;10 reps   Horizontal ABduction AROM;10 reps   External Rotation AROM;10 reps   Internal Rotation AROM;10 reps   Flexion AROM;10 reps   ABduction AROM;10 reps   Extension AROM;10 reps   Shoulder Exercises: ROM/Strengthening   Proximal Shoulder  Strengthening, Supine 10X   Manual Therapy   Manual Therapy Myofascial release   Myofascial Release myofascial release to right bicep, upper arm, trapezius, and scapularis regions to decrease pain and increase joint range of motion.                 OT Education - 04/16/15 1515    Education provided Yes   Education Details AROM exercises   Person(s) Educated Patient   Methods Explanation;Demonstration;Handout   Comprehension Verbalized understanding;Returned demonstration          OT Short Term Goals - 04/16/15 1518    OT SHORT TERM GOAL #1   Title Pt will be educated on HEP.    Time 6   Period Weeks   Status On-going   OT SHORT TERM GOAL #2   Title Pt will decrease pain to 2/10 or less during daily tasks.    Time 6   Period Weeks   Status On-going   OT SHORT TERM GOAL #3   Title Pt will decrease fascial restrictions to min amount or less.    Time 6   Period Weeks   Status On-going   OT SHORT TERM GOAL #4   Title Pt will increase range of motion to WNL to increase ability to fasten her bra.    Time 6   Period Weeks   Status On-going   OT SHORT TERM GOAL #5   Title Pt will increase strength to 4+/5 to increase ability to lift heavy objects from overhead shelves.    Time 6   Period Weeks   Status On-going                  Plan - 04/16/15 1516    Clinical Impression Statement A: Initiated myofascial release, manual stretching, and AROM exercises this date. Pt reports no pain today and states she completed her shoulder stretches this morning. Pt tolerated treatment well. Pt provided with and educated on AROM HEP.    Plan P: Add scapular theraband, follow-up on HEP.         Problem List Patient Active Problem List   Diagnosis Date Noted  . Lumbar compression fracture 12/21/2014  . Morbid obesity 03/07/2013  . Obstructive sleep apnea 03/07/2013  . Fibromyalgia 03/07/2013  . Other and unspecified hyperlipidemia  03/07/2013  . Hx of gastritis  03/07/2013  . S/P cholecystectomy 03/07/2013  . Chest pain 01/07/2013    Guadelupe Sabin, OTR/L  [phone]  04/16/2015, 3:20 PM  [facility] [address] [place], [place], [address] Phone: [phone]   Fax:  [phone]

## 2015-04-20 ENCOUNTER — Encounter (HOSPITAL_COMMUNITY): Payer: Self-pay | Admitting: Occupational Therapy

## 2015-04-20 ENCOUNTER — Ambulatory Visit (HOSPITAL_COMMUNITY): Payer: Commercial Managed Care - HMO | Admitting: Occupational Therapy

## 2015-04-20 DIAGNOSIS — M629 Disorder of muscle, unspecified: Secondary | ICD-10-CM

## 2015-04-20 DIAGNOSIS — M25611 Stiffness of right shoulder, not elsewhere classified: Secondary | ICD-10-CM

## 2015-04-20 DIAGNOSIS — R29898 Other symptoms and signs involving the musculoskeletal system: Secondary | ICD-10-CM

## 2015-04-20 DIAGNOSIS — M6289 Other specified disorders of muscle: Secondary | ICD-10-CM

## 2015-04-20 DIAGNOSIS — M25511 Pain in right shoulder: Secondary | ICD-10-CM

## 2015-04-20 NOTE — Therapy (Signed)
[facility] [place], [place], [address] Phone: [phone]   Fax:  [phone]  Occupational Therapy Treatment  Patient Details  Name: [name] MRN: [id number] Date of Birth: Apr 18, 1946 Referring Provider:  Kathryne Hitch, MD  Encounter Date: 04/20/2015      OT End of Session - 04/20/15 1523    Visit Number 3   Number of Visits 6   Date for OT Re-Evaluation 06/14/15  Mini-reassess on 05/06/15   OT Start Time 1348   OT Stop Time 1432   OT Time Calculation (min) 44 min   Activity Tolerance Patient tolerated treatment well   Behavior During Therapy [facility] for tasks assessed/performed      Past Medical History  Diagnosis Date  . H/O hiatal hernia   . Chronic back pain   . Thyroid disease     takes Synthroid daily  . Allergy     SEASONAL-flonase daily as well as Claritin  . Arthritis     BACK/KNEE  . Depression     takes Wellbutrin daily  . Anxiety     takes Celexa every evening  . Anemia     takes Ferrous Sulfate daily  . Muscle spasm     takes Robaxin daily as needed  . Nausea     takes Zofran daily as needed  . Hyperlipidemia     takes Pravastatin daily as well as Questran  . GERD (gastroesophageal reflux disease)     takes Omeprazole daily and Zantac at bedtime  . Insomnia     takes trazodone nightly  . Hypertension     takes Diovan daily  . Sleep apnea     CPAP-SLEEP STUDY DONE > 5 YRS AGO  . Headache     OCCASIONALLY  . Fibromyalgia   . Joint pain   . Chronic back pain     L1 FRACTURE  . History of colon polyps   . Cataract     IMMATURE AND ON LEFT EYE SHE THINKS  . Numbness and tingling     LEGS  . Nocturia   . History of shingles     Past Surgical History  Procedure Laterality Date  . Abdominal hysterectomy    . Cholecystectomy    . Tonsillectomy    . Rectocele repair    . 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 Colonoscopy    . Upper gastrointestinal endoscopy   02/2013  . Carpal tunnel release      right and lt  . Fracture surgery      rt hand  . Fracture surgery      lr wrist  . Orif finger fracture  04/11/2012    Procedure: OPEN REDUCTION INTERNAL FIXATION (ORIF) METACARPAL (FINGER) FRACTURE;  Surgeon: Ninetta Lights, MD;  Location: [facility];  Service: Orthopedics;  Laterality: Left;  orif left hand 4th metacarpal   . Hand surgery Right 06/1985  . Hand surgery Left   . Wrist surgery Left   . Knee arthroscopy Left   . Foot surgery Bilateral   . Cardiac catheterization    . Colonoscopy    . Kyphoplasty N/A 12/21/2014    Procedure: KYPHOPLASTY LUMBAR ONE;  Surgeon: Newman Pies, MD;  Location: [facility];  Service: Neurosurgery;  Laterality: N/A;  L1 Kyphoplasty    There were no vitals filed for this visit.  Visit Diagnosis:  Pain in joint, shoulder region, right  Tight fascia  Decreased range of motion of shoulder, right  Weakness of shoulder      Subjective Assessment - 04/20/15 1351    Subjective  S: I'm doing my exercises at home.    Currently in Pain? Yes   Pain Score 5    Pain Location Shoulder   Pain Orientation Right   Pain Descriptors / Indicators Aching   Pain Type Acute pain            [facility] OT Assessment - 04/20/15 1521    Assessment   Diagnosis Right shoulder impingement, anterior subluxation, bankart lesion   Precautions   Precautions None                  OT Treatments/Exercises (OP) - 04/20/15 1411    Exercises   Exercises Shoulder   Shoulder Exercises: Supine   Protraction PROM;AROM;12 reps   Horizontal ABduction PROM;AROM;12 reps   External Rotation PROM;AROM;12 reps   Internal Rotation PROM;AROM;12 reps   Flexion PROM;AROM;12 reps   ABduction PROM;AROM;12 reps   Shoulder Exercises: Standing   Protraction AROM;12 reps   Horizontal ABduction AROM;12 reps   External Rotation AROM;12 reps   Internal Rotation AROM;12 reps   Flexion AROM;12 reps   ABduction  AROM;12 reps   Extension AROM;12 reps;Theraband;10 reps   Row AROM;10 reps   Shoulder Exercises: ROM/Strengthening   Proximal Shoulder Strengthening, Supine 10X   Proximal Shoulder Strengthening, Seated 10X   Manual Therapy   Manual Therapy Myofascial release   Myofascial Release myofascial release to right bicep, upper arm, trapezius, and scapularis regions to decrease pain and increase joint range of motion.                 OT Education - 04/20/15 1522    Education provided Yes   Education Details Scapular theraband-red   Person(s) Educated Patient   Methods Explanation;Demonstration;Handout   Comprehension Verbalized understanding;Returned demonstration          OT Short Term Goals - 04/16/15 1518    OT SHORT TERM GOAL #1   Title Pt will be educated on HEP.    Time 6   Period Weeks   Status On-going   OT SHORT TERM GOAL #2   Title Pt will decrease pain to 2/10 or less during daily tasks.    Time 6   Period Weeks   Status On-going   OT SHORT TERM GOAL #3   Title Pt will decrease fascial restrictions to min amount or less.    Time 6   Period Weeks   Status On-going   OT SHORT TERM GOAL #4   Title Pt will increase range of motion to WNL to increase ability to fasten her bra.    Time 6   Period Weeks   Status On-going   OT SHORT TERM GOAL #5   Title Pt will increase strength to 4+/5 to increase ability to lift heavy objects from overhead shelves.    Time 6   Period Weeks   Status On-going                  Plan - 04/20/15 1523    Clinical Impression Statement A: Increased AROM to 12 repetitions. Added proximal shoulder strengthening in standing. Added scapular theraband. Pt reports she is completing HEP daily. Pt notices that she is better able to reach her bra since she has been completing her exercises. Pt tolerated treatment well.    Plan P: Follow-up on HEP. Add x to v  arms, prot/ret/elev/dep.         Problem List Patient Active Problem  List   Diagnosis Date Noted  . Lumbar compression fracture 12/21/2014  . Morbid obesity 03/07/2013  . Obstructive sleep apnea 03/07/2013  . Fibromyalgia 03/07/2013  . Other and unspecified hyperlipidemia 03/07/2013  . Hx of gastritis 03/07/2013  . S/P cholecystectomy 03/07/2013  . Chest pain 01/07/2013    Guadelupe Sabin, OTR/L  [phone]  04/20/2015, 3:26 PM  [facility] [address] [place], [place], [address] Phone: [phone]   Fax:  [phone]

## 2015-04-20 NOTE — Patient Instructions (Signed)

## 2015-04-22 ENCOUNTER — Encounter (HOSPITAL_COMMUNITY): Payer: Medicare HMO | Admitting: Occupational Therapy

## 2015-04-29 ENCOUNTER — Encounter (HOSPITAL_COMMUNITY): Payer: Medicare HMO | Admitting: Occupational Therapy

## 2015-04-29 ENCOUNTER — Telehealth (HOSPITAL_COMMUNITY): Payer: Self-pay

## 2015-04-29 NOTE — Telephone Encounter (Signed)
Patient canceled her appt,she is not feeling well today.

## 2015-05-06 ENCOUNTER — Encounter (HOSPITAL_COMMUNITY): Payer: Self-pay | Admitting: Occupational Therapy

## 2015-05-06 ENCOUNTER — Ambulatory Visit (HOSPITAL_COMMUNITY): Payer: Commercial Managed Care - HMO | Attending: Orthopedic Surgery | Admitting: Occupational Therapy

## 2015-05-06 DIAGNOSIS — M629 Disorder of muscle, unspecified: Secondary | ICD-10-CM | POA: Diagnosis not present

## 2015-05-06 DIAGNOSIS — M6281 Muscle weakness (generalized): Secondary | ICD-10-CM | POA: Insufficient documentation

## 2015-05-06 DIAGNOSIS — M6289 Other specified disorders of muscle: Secondary | ICD-10-CM

## 2015-05-06 DIAGNOSIS — R29898 Other symptoms and signs involving the musculoskeletal system: Secondary | ICD-10-CM | POA: Insufficient documentation

## 2015-05-06 DIAGNOSIS — M25611 Stiffness of right shoulder, not elsewhere classified: Secondary | ICD-10-CM | POA: Insufficient documentation

## 2015-05-06 DIAGNOSIS — M25511 Pain in right shoulder: Secondary | ICD-10-CM | POA: Diagnosis not present

## 2015-05-06 NOTE — Therapy (Signed)
[facility] [place], [place], [address] Phone: [phone]   Fax:  [phone]  Occupational Therapy Reassessment and Treatment  Patient Details  Name: [name] MRN: [id number] Date of Birth: July 11, 1946 Referring Provider:  Kathryne Hitch, MD  Encounter Date: 05/06/2015      OT End of Session - 05/06/15 1148    Visit Number 4   Number of Visits 6   Date for OT Re-Evaluation 06/14/15   OT Start Time 1102   OT Stop Time 1145   OT Time Calculation (min) 43 min   Activity Tolerance Patient tolerated treatment well   Behavior During Therapy [facility] for tasks assessed/performed      Past Medical History  Diagnosis Date  . H/O hiatal hernia   . Chronic back pain   . Thyroid disease     takes Synthroid daily  . Allergy     SEASONAL-flonase daily as well as Claritin  . Arthritis     BACK/KNEE  . Depression     takes Wellbutrin daily  . Anxiety     takes Celexa every evening  . Anemia     takes Ferrous Sulfate daily  . Muscle spasm     takes Robaxin daily as needed  . Nausea     takes Zofran daily as needed  . Hyperlipidemia     takes Pravastatin daily as well as Questran  . GERD (gastroesophageal reflux disease)     takes Omeprazole daily and Zantac at bedtime  . Insomnia     takes trazodone nightly  . Hypertension     takes Diovan daily  . Sleep apnea     CPAP-SLEEP STUDY DONE > 5 YRS AGO  . Headache     OCCASIONALLY  . Fibromyalgia   . Joint pain   . Chronic back pain     L1 FRACTURE  . History of colon polyps   . Cataract     IMMATURE AND ON LEFT EYE SHE THINKS  . Numbness and tingling     LEGS  . Nocturia   . History of shingles     Past Surgical History  Procedure Laterality Date  . Abdominal hysterectomy    . Cholecystectomy    . Tonsillectomy    . Rectocele repair    . 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 Colonoscopy    . Upper gastrointestinal endoscopy  02/2013   . Carpal tunnel release      right and lt  . Fracture surgery      rt hand  . Fracture surgery      lr wrist  . Orif finger fracture  04/11/2012    Procedure: OPEN REDUCTION INTERNAL FIXATION (ORIF) METACARPAL (FINGER) FRACTURE;  Surgeon: Ninetta Lights, MD;  Location: [facility];  Service: Orthopedics;  Laterality: Left;  orif left hand 4th metacarpal   . Hand surgery Right 06/1985  . Hand surgery Left   . Wrist surgery Left   . Knee arthroscopy Left   . Foot surgery Bilateral   . Cardiac catheterization    . Colonoscopy    . Kyphoplasty N/A 12/21/2014    Procedure: KYPHOPLASTY LUMBAR ONE;  Surgeon: Newman Pies, MD;  Location: [facility];  Service: Neurosurgery;  Laterality: N/A;  L1 Kyphoplasty    There were no vitals filed for this visit.  Visit Diagnosis:  Pain in joint, shoulder region, right  Tight fascia  Decreased  range of motion of shoulder, right  Weakness of shoulder      Subjective Assessment - 05/06/15 1104    Subjective  S: It's been hurting in my upper arm.    Currently in Pain? Yes   Pain Score 5    Pain Location Shoulder   Pain Orientation Right   Pain Descriptors / Indicators Aching   Pain Type Acute pain           [facility] OT Assessment - 05/06/15 1121    Assessment   Diagnosis Right shoulder impingement, anterior subluxation, bankart lesion   Precautions   Precautions None   Palpation   Palpation Min fascial restrictions in right upper arm   AROM   Overall AROM Comments Assessed in standing, ER/IR adducted   AROM Assessment Site Shoulder   Right/Left Shoulder Right   Right Shoulder Flexion 163 Degrees  142 previous   Right Shoulder ABduction 165 Degrees  133 previous   Right Shoulder Internal Rotation 90 Degrees  same on eval   Right Shoulder External Rotation 71 Degrees  55 previous   PROM   Overall PROM Comments Assessed in supine, ER/IR adducted   PROM Assessment Site Shoulder   Right/Left Shoulder Right    Right Shoulder Flexion 175 Degrees  152 previous   Right Shoulder ABduction 170 Degrees  135 previous   Right Shoulder Internal Rotation 90 Degrees  same on eval   Right Shoulder External Rotation 74 Degrees  65 previous   Strength   Overall Strength Comments Assessed seated, ER/IR adducted   Right Shoulder Flexion 5/5  4/5 previous   Right Shoulder ABduction 4+/5  4/5 previous   Right Shoulder Internal Rotation 4/5  4-/5 previous   Right Shoulder External Rotation 4/5  4-/5 previous                  OT Treatments/Exercises (OP) - 05/06/15 1146    Exercises   Exercises Shoulder   Shoulder Exercises: Supine   Protraction PROM;5 reps   Horizontal ABduction PROM;5 reps   External Rotation PROM;5 reps   Internal Rotation PROM;5 reps   Flexion PROM;5 reps   ABduction PROM;5 reps   Manual Therapy   Manual Therapy Myofascial release   Myofascial Release myofascial release to right bicep, upper arm, trapezius, and scapularis regions to decrease pain and increase joint range of motion.                OT Education - 05/06/15 1147    Education provided Yes   Education Details Red theraband exercises   Person(s) Educated Patient   Methods Explanation;Demonstration;Handout   Comprehension Verbalized understanding;Returned demonstration          OT Short Term Goals - 05/06/15 1128    OT SHORT TERM GOAL #1   Title Pt will be educated on HEP.    Time 6   Period Weeks   Status Achieved   OT SHORT TERM GOAL #2   Title Pt will decrease pain to 2/10 or less during daily tasks.    Time 6   Period Weeks   Status Partially Met   OT SHORT TERM GOAL #3   Title Pt will decrease fascial restrictions to min amount or less.    Time 6   Period Weeks   Status Achieved   OT SHORT TERM GOAL #4   Title Pt will increase range of motion to WNL to increase ability to fasten her bra.    Time 6  Period Weeks   Status Achieved   OT SHORT TERM GOAL #5   Title Pt will  increase strength to 4+/5 to increase ability to lift heavy objects from overhead shelves.    Time 6   Period Weeks   Status Achieved                  Plan - 05/06/15 1148    Clinical Impression Statement A: Reassessment completed this date. Pt has met 4/5 STGs and partially met 1/5 STGs. Pt reports she is now able to fasten her bra and lift objects without difficulty. Pt has minimal pain occassionally if she lifts and carries objects over 10 pounds. FOTO Score 78/100 (22% impairment). Pt provided with red theraband exercise to add to HEP, instructed to continue shoulder stretches and AROM exercises as well. Pt is agreeable to discharge.    Plan P: D/C patient        Problem List Patient Active Problem List   Diagnosis Date Noted  . Lumbar compression fracture 12/21/2014  . Morbid obesity 03/07/2013  . Obstructive sleep apnea 03/07/2013  . Fibromyalgia 03/07/2013  . Other and unspecified hyperlipidemia 03/07/2013  . Hx of gastritis 03/07/2013  . S/P cholecystectomy 03/07/2013  . Chest pain 01/07/2013    Guadelupe Sabin, OTR/L  [phone]  05/06/2015, 11:55 AM  [facility] [place], [place], [address] Phone: [phone]   Fax:  [phone]   OCCUPATIONAL THERAPY DISCHARGE SUMMARY  Visits from Start of Care: 4  Current functional level related to goals / functional outcomes: See goals above.  Pt is now able to complete all B/IADLs as she usually would and is able to reach behind her to fasten her bra.    Remaining deficits: Pt continues to have min-mod pain at times when lifting heavy objects or when she uses her arm more than usual during the day.    Education / Equipment: [employer] theraband exercises. Educated pt on continuing to complete shoulder stretches and AROM exercise at home.   Plan: Patient agrees to discharge.  Patient goals were met. Patient is being discharged due to meeting the stated rehab  goals.  ?????

## 2015-05-06 NOTE — Patient Instructions (Signed)
Strengthening: Chest Pull - Resisted   Hold Theraband in front of body with hands about shoulder width a part. Pull band a part and back together slowly. Repeat __10__ times. Complete __1__ set(s) per session.. Repeat _1___ session(s) per day.  http://orth.exer.us/926   Copyright  VHI. All rights reserved.   PNF Strengthening: Resisted   Standing with resistive band around each hand, bring right arm up and away, thumb back. Repeat _10___ times per set. Do _1___ sets per session. Do _1___ sessions per day.  http://orth.exer.us/918   Copyright  VHI. All rights reserved.   PNF Strengthening: Resisted   Standing with resistive band around each hand, bring right arm up and across body. Repeat __10__ times per set. Do _1___ sets per session. Do _1___ sessions per day.  http://orth.exer.us/920   Copyright  VHI. All rights reserved.    Resisted External Rotation: in Neutral - Bilateral   Sit or stand, tubing in both hands, elbows at sides, bent to 90, forearms forward. Pinch shoulder blades together and rotate forearms out. Keep elbows at sides. Repeat _10___ times per set. Do ___1_ sets per session. Do _1___ sessions per day.  http://orth.exer.us/966   Copyright  VHI. All rights reserved.   PNF Strengthening: Resisted   Standing, hold resistive band above head. Bring right arm down and out from side. Repeat __10__ times per set. Do __1__ sets per session. Do _1___ sessions per day.  http://orth.exer.us/922   Copyright  VHI. All rights reserved.

## 2015-05-10 ENCOUNTER — Other Ambulatory Visit: Payer: Self-pay | Admitting: Physician Assistant

## 2015-05-13 ENCOUNTER — Encounter (HOSPITAL_COMMUNITY): Payer: Medicare HMO | Admitting: Occupational Therapy

## 2015-05-18 ENCOUNTER — Encounter (HOSPITAL_BASED_OUTPATIENT_CLINIC_OR_DEPARTMENT_OTHER): Payer: Self-pay | Admitting: *Deleted

## 2015-05-19 ENCOUNTER — Encounter (HOSPITAL_BASED_OUTPATIENT_CLINIC_OR_DEPARTMENT_OTHER)
Admission: RE | Admit: 2015-05-19 | Discharge: 2015-05-19 | Disposition: A | Discharge status: Home / Self Care | Payer: No Typology Code available for payment source | Source: Ambulatory Visit | Attending: Orthopedic Surgery | Admitting: Orthopedic Surgery

## 2015-05-19 DIAGNOSIS — Y929 Unspecified place or not applicable: Secondary | ICD-10-CM | POA: Diagnosis not present

## 2015-05-19 DIAGNOSIS — M24011 Loose body in right shoulder: Secondary | ICD-10-CM | POA: Diagnosis not present

## 2015-05-19 DIAGNOSIS — G4730 Sleep apnea, unspecified: Secondary | ICD-10-CM | POA: Diagnosis not present

## 2015-05-19 DIAGNOSIS — S46011A Strain of muscle(s) and tendon(s) of the rotator cuff of right shoulder, initial encounter: Secondary | ICD-10-CM | POA: Diagnosis present

## 2015-05-19 DIAGNOSIS — E119 Type 2 diabetes mellitus without complications: Secondary | ICD-10-CM | POA: Diagnosis not present

## 2015-05-19 DIAGNOSIS — M7541 Impingement syndrome of right shoulder: Secondary | ICD-10-CM | POA: Diagnosis not present

## 2015-05-19 DIAGNOSIS — I10 Essential (primary) hypertension: Secondary | ICD-10-CM | POA: Diagnosis not present

## 2015-05-19 DIAGNOSIS — S42141A Displaced fracture of glenoid cavity of scapula, right shoulder, initial encounter for closed fracture: Secondary | ICD-10-CM | POA: Diagnosis not present

## 2015-05-19 DIAGNOSIS — K449 Diaphragmatic hernia without obstruction or gangrene: Secondary | ICD-10-CM | POA: Diagnosis not present

## 2015-05-19 DIAGNOSIS — X58XXXA Exposure to other specified factors, initial encounter: Secondary | ICD-10-CM | POA: Diagnosis not present

## 2015-05-19 LAB — BASIC METABOLIC PANEL
Anion gap: 8 (ref 5–15)
BUN: 12 mg/dL (ref 6–20)
CO2: 25 mmol/L (ref 22–32)
Calcium: 9 mg/dL (ref 8.9–10.3)
Chloride: 107 mmol/L (ref 101–111)
Creatinine, Ser: 0.77 mg/dL (ref 0.44–1.00)
GFR calc Af Amer: >60 mL/min (ref >60)
GFR calc non Af Amer: >60 mL/min (ref >60)
Glucose, Bld: 92 mg/dL (ref 65–99)
Potassium: 3.5 mmol/L (ref 3.5–5.1)
Sodium: 140 mmol/L (ref 135–145)

## 2015-05-19 NOTE — H&P (Signed)
  [facility] [address], [place] [address] [phone] A Division of [facility]  Ninetta Lights, M.D.   Robert A. Noemi Chapel, M.D.   Faythe Casa, M.D.   Johnny Bridge, M.D.   Almedia Balls, M.D Ernesta Amble. Percell Miller, M.D.  Joseph Pierini, M.D.  Lanier Prude, M.D.    Verner Chol, M.D. Lovett Calender, PA- C  Laylani L. Venida Jarvis, PA-C  Kirstin A. Shepperson, PA-C  Josh Aguilar, PA-C  [place], [place]   RE: [place], [place]                                [phone]      DOB: 1946-08-17 PROGRESS NOTE: 05-07-15 Anel is a pleasant 69 year-old female who presents to our clinic today with continued right shoulder pain.  This began on March 02, 2015 with a dramatic injury to her right shoulder.  We have injected her subacromial space twice with only 2-3 days maximum relief of symptoms.  We sent her to outpatient physical therapy and she is really showing minimal improvement.  MRI scan completed from April 01, 2015 revealed mild to moderate rotator cuff tendinopathy tendinosis with no tear.  Suspected torn anterior labrum, as well as a possible bony Bankart fracture involving the glenoid.  Mikayela comes in for further evaluation and treatment recommendations.   Past medical, social and family history reviewed in detail on the patient questionnaire and signed.  Review of systems: As detailed in HPI.  All others reviewed and are negative.   EXAMINATION: Well-developed, well-nourished female in no acute distress.  Alert and oriented x 3.  Examination of her right shoulder reveals full active range of motion in all directions.  4/5 strength with resisted external rotation.  Negative apprehension.  Positive empty can.  Positive cross body.  She is neurovascularly intact distally.    X-RAYS: X-rays of her right shoulder reveal a non-displaced glenoid fracture.    IMPRESSION:  PLAN: At this point in time we have  exhausted all conservative measures.  We feel it is appropriate to proceed with a right shoulder arthroscopic decompression and possible Bankart reconstruction and rotator cuff repair.  Risks, benefits and possible complications of surgery have been reviewed.  Rehab and recovery time discussed.  All questions answered.  We will see Nadege at the time of operative intervention.    Ninetta Lights, M.D.   Dictated by: Tawanna Cooler, PA-C Electronically verified by Ninetta Lights, M.D. DFM(LS):jjh D 05-07-15 T 05-10-15

## 2015-05-20 ENCOUNTER — Encounter (HOSPITAL_BASED_OUTPATIENT_CLINIC_OR_DEPARTMENT_OTHER)
Admission: RE | Disposition: A | Discharge status: Home / Self Care | Payer: Self-pay | Source: Ambulatory Visit | Attending: Orthopedic Surgery

## 2015-05-20 ENCOUNTER — Ambulatory Visit (HOSPITAL_BASED_OUTPATIENT_CLINIC_OR_DEPARTMENT_OTHER): Payer: Commercial Managed Care - HMO | Admitting: Anesthesiology

## 2015-05-20 ENCOUNTER — Ambulatory Visit (HOSPITAL_BASED_OUTPATIENT_CLINIC_OR_DEPARTMENT_OTHER)
Admission: RE | Admit: 2015-05-20 | Discharge: 2015-05-20 | Disposition: A | Discharge status: Home / Self Care | Payer: Commercial Managed Care - HMO | Source: Ambulatory Visit | Attending: Orthopedic Surgery | Admitting: Orthopedic Surgery

## 2015-05-20 ENCOUNTER — Encounter (HOSPITAL_COMMUNITY): Payer: Medicare HMO | Admitting: Occupational Therapy

## 2015-05-20 ENCOUNTER — Encounter (HOSPITAL_BASED_OUTPATIENT_CLINIC_OR_DEPARTMENT_OTHER): Payer: Self-pay | Admitting: Anesthesiology

## 2015-05-20 DIAGNOSIS — G4730 Sleep apnea, unspecified: Secondary | ICD-10-CM | POA: Insufficient documentation

## 2015-05-20 DIAGNOSIS — K449 Diaphragmatic hernia without obstruction or gangrene: Secondary | ICD-10-CM | POA: Insufficient documentation

## 2015-05-20 DIAGNOSIS — X58XXXA Exposure to other specified factors, initial encounter: Secondary | ICD-10-CM | POA: Insufficient documentation

## 2015-05-20 DIAGNOSIS — I10 Essential (primary) hypertension: Secondary | ICD-10-CM | POA: Insufficient documentation

## 2015-05-20 DIAGNOSIS — E119 Type 2 diabetes mellitus without complications: Secondary | ICD-10-CM | POA: Insufficient documentation

## 2015-05-20 DIAGNOSIS — S42141A Displaced fracture of glenoid cavity of scapula, right shoulder, initial encounter for closed fracture: Secondary | ICD-10-CM | POA: Insufficient documentation

## 2015-05-20 DIAGNOSIS — M24011 Loose body in right shoulder: Secondary | ICD-10-CM | POA: Insufficient documentation

## 2015-05-20 DIAGNOSIS — Y929 Unspecified place or not applicable: Secondary | ICD-10-CM | POA: Insufficient documentation

## 2015-05-20 DIAGNOSIS — S46011A Strain of muscle(s) and tendon(s) of the rotator cuff of right shoulder, initial encounter: Secondary | ICD-10-CM | POA: Diagnosis not present

## 2015-05-20 DIAGNOSIS — M7541 Impingement syndrome of right shoulder: Secondary | ICD-10-CM | POA: Insufficient documentation

## 2015-05-20 HISTORY — DX: Age-related osteoporosis without current pathological fracture: M81.0

## 2015-05-20 HISTORY — DX: Type 2 diabetes mellitus without complications: E11.9

## 2015-05-20 HISTORY — PX: SHOULDER ARTHROSCOPY WITH DISTAL CLAVICLE RESECTION: SHX5675

## 2015-05-20 LAB — POCT HEMOGLOBIN-HEMACUE: Hemoglobin: 12.6 g/dL (ref 12.0–15.0)

## 2015-05-20 LAB — GLUCOSE, CAPILLARY
Glucose-Capillary: 98 mg/dL (ref 65–99)
Glucose-Capillary: 95 mg/dL (ref 65–99)

## 2015-05-20 SURGERY — SHOULDER ARTHROSCOPY WITH DISTAL CLAVICLE RESECTION
Anesthesia: General | Site: Shoulder | Laterality: Right

## 2015-05-20 MED ORDER — MIDAZOLAM HCL 2 MG/2ML IJ SOLN
INTRAMUSCULAR | Status: AC
  Filled 2015-05-20: qty 2

## 2015-05-20 MED ORDER — LACTATED RINGERS IV SOLN
INTRAVENOUS | Status: DC
  Administered 2015-05-20 (×2): via INTRAVENOUS

## 2015-05-20 MED ORDER — PROPOFOL 10 MG/ML IV BOLUS
INTRAVENOUS | Status: AC
  Filled 2015-05-20: qty 80

## 2015-05-20 MED ORDER — FENTANYL CITRATE (PF) 100 MCG/2ML IJ SOLN
INTRAMUSCULAR | Status: DC | PRN
  Administered 2015-05-20 (×4): 25 ug via INTRAVENOUS

## 2015-05-20 MED ORDER — DEXAMETHASONE SODIUM PHOSPHATE 4 MG/ML IJ SOLN
INTRAMUSCULAR | Status: DC | PRN
  Administered 2015-05-20: 10 mg via INTRAVENOUS

## 2015-05-20 MED ORDER — GLYCOPYRROLATE 0.2 MG/ML IJ SOLN: 0.2000 mg | Freq: Once | INTRAMUSCULAR | Status: DC | PRN

## 2015-05-20 MED ORDER — GLYCOPYRROLATE 0.2 MG/ML IJ SOLN
INTRAMUSCULAR | Status: DC | PRN
  Administered 2015-05-20: 0.2 mg via INTRAVENOUS

## 2015-05-20 MED ORDER — PROPOFOL 500 MG/50ML IV EMUL
INTRAVENOUS | Status: AC
  Filled 2015-05-20: qty 50

## 2015-05-20 MED ORDER — SCOPOLAMINE 1 MG/3DAYS TD PT72
1.0000 | MEDICATED_PATCH | TRANSDERMAL | Status: DC
  Administered 2015-05-20: 1.5 mg via TRANSDERMAL

## 2015-05-20 MED ORDER — CLINDAMYCIN PHOSPHATE 900 MG/50ML IV SOLN
900.0000 mg | INTRAVENOUS | Status: AC
  Administered 2015-05-20: 900 mg via INTRAVENOUS

## 2015-05-20 MED ORDER — ONDANSETRON HCL 4 MG/2ML IJ SOLN
INTRAMUSCULAR | Status: DC | PRN
  Administered 2015-05-20: 4 mg via INTRAVENOUS

## 2015-05-20 MED ORDER — CHLORHEXIDINE GLUCONATE 4 % EX LIQD: 60.0000 mL | Freq: Once | CUTANEOUS | Status: DC

## 2015-05-20 MED ORDER — FENTANYL CITRATE (PF) 100 MCG/2ML IJ SOLN
50.0000 ug | INTRAMUSCULAR | Status: DC | PRN
  Administered 2015-05-20: 100 ug via INTRAVENOUS

## 2015-05-20 MED ORDER — LACTATED RINGERS IV SOLN: INTRAVENOUS | Status: DC

## 2015-05-20 MED ORDER — CLINDAMYCIN PHOSPHATE 900 MG/50ML IV SOLN
INTRAVENOUS | Status: AC
  Filled 2015-05-20: qty 50

## 2015-05-20 MED ORDER — ONDANSETRON HCL 4 MG PO TABS: 4.0000 mg | ORAL_TABLET | Freq: Three times a day (TID) | ORAL | Status: DC | PRN

## 2015-05-20 MED ORDER — ROPIVACAINE HCL 5 MG/ML IJ SOLN
INTRAMUSCULAR | Status: DC | PRN
  Administered 2015-05-20: 30 mg via PERINEURAL

## 2015-05-20 MED ORDER — FENTANYL CITRATE (PF) 100 MCG/2ML IJ SOLN
INTRAMUSCULAR | Status: AC
  Filled 2015-05-20: qty 2

## 2015-05-20 MED ORDER — PROPOFOL 10 MG/ML IV BOLUS
INTRAVENOUS | Status: DC | PRN
  Administered 2015-05-20: 110 mg via INTRAVENOUS

## 2015-05-20 MED ORDER — FENTANYL CITRATE (PF) 100 MCG/2ML IJ SOLN
INTRAMUSCULAR | Status: AC
  Filled 2015-05-20: qty 6

## 2015-05-20 MED ORDER — MIDAZOLAM HCL 2 MG/2ML IJ SOLN
1.0000 mg | INTRAMUSCULAR | Status: DC | PRN
  Administered 2015-05-20: 2 mg via INTRAVENOUS

## 2015-05-20 MED ORDER — OXYCODONE-ACETAMINOPHEN 5-325 MG PO TABS: 1.0000 | ORAL_TABLET | ORAL | Status: DC | PRN

## 2015-05-20 MED ORDER — SCOPOLAMINE 1 MG/3DAYS TD PT72
MEDICATED_PATCH | TRANSDERMAL | Status: AC
  Filled 2015-05-20: qty 1

## 2015-05-20 MED ORDER — LIDOCAINE HCL (CARDIAC) 20 MG/ML IV SOLN
INTRAVENOUS | Status: DC | PRN
  Administered 2015-05-20: 50 mg via INTRAVENOUS

## 2015-05-20 MED ORDER — SUCCINYLCHOLINE CHLORIDE 20 MG/ML IJ SOLN
INTRAMUSCULAR | Status: DC | PRN
  Administered 2015-05-20: 50 mg via INTRAVENOUS

## 2015-05-20 SURGICAL SUPPLY — 74 items
APL SKNCLS STERI-STRIP NONHPOA (GAUZE/BANDAGES/DRESSINGS)
BENZOIN TINCTURE PRP APPL 2/3 (GAUZE/BANDAGES/DRESSINGS) IMPLANT
BLADE CUTTER GATOR 3.5 (BLADE) ×2 IMPLANT
BLADE CUTTER MENIS 5.5 (BLADE) IMPLANT
BLADE GREAT WHITE 4.2 (BLADE) ×2 IMPLANT
BLADE MINI RND TIP GREEN BEAV (BLADE) IMPLANT
BLADE SURG 15 STRL LF DISP TIS (BLADE) IMPLANT
BLADE SURG 15 STRL SS (BLADE)
BUR OVAL 6.0 (BURR) ×2 IMPLANT
CANNULA 5.75X71 LONG (CANNULA) IMPLANT
CANNULA DRY DOC 8X75 (CANNULA) IMPLANT
CANNULA TWIST IN 8.25X7CM (CANNULA) IMPLANT
CANNULA TWIST IN 8.25X9CM (CANNULA) IMPLANT
DECANTER SPIKE VIAL GLASS SM (MISCELLANEOUS) IMPLANT
DRAPE STERI 35X30 U-POUCH (DRAPES) ×2 IMPLANT
DRAPE U-SHAPE 47X51 STRL (DRAPES) ×2 IMPLANT
DRAPE U-SHAPE 76X120 STRL (DRAPES) ×4 IMPLANT
DRSG PAD ABDOMINAL 8X10 ST (GAUZE/BANDAGES/DRESSINGS) ×2 IMPLANT
DURAPREP 26ML APPLICATOR (WOUND CARE) ×2 IMPLANT
ELECT MENISCUS 165MM 90D (ELECTRODE) ×2 IMPLANT
ELECT REM PT RETURN 9FT ADLT (ELECTROSURGICAL) ×2
ELECTRODE REM PT RTRN 9FT ADLT (ELECTROSURGICAL) ×1 IMPLANT
GAUZE SPONGE 4X4 12PLY STRL (GAUZE/BANDAGES/DRESSINGS) ×2 IMPLANT
GAUZE SPONGE 4X4 16PLY XRAY LF (GAUZE/BANDAGES/DRESSINGS) IMPLANT
GAUZE XEROFORM 1X8 LF (GAUZE/BANDAGES/DRESSINGS) ×2 IMPLANT
GLOVE BIOGEL PI IND STRL 7.0 (GLOVE) ×1 IMPLANT
GLOVE BIOGEL PI INDICATOR 7.0 (GLOVE) ×2
GLOVE ECLIPSE 7.0 STRL STRAW (GLOVE) ×2 IMPLANT
GLOVE ORTHO TXT STRL SZ7.5 (GLOVE) ×2 IMPLANT
GLOVE SURG ORTHO 8.0 STRL STRW (GLOVE) ×2 IMPLANT
GOWN STRL REUS W/ TWL LRG LVL3 (GOWN DISPOSABLE) ×2 IMPLANT
GOWN STRL REUS W/ TWL XL LVL3 (GOWN DISPOSABLE) ×1 IMPLANT
GOWN STRL REUS W/TWL LRG LVL3 (GOWN DISPOSABLE) ×4
GOWN STRL REUS W/TWL XL LVL3 (GOWN DISPOSABLE) ×2
IMMOBILIZER SHOULDER FOAM XLGE (SOFTGOODS) IMPLANT
KIT PUSHLOCK 2.9 HIP (KITS) IMPLANT
LASSO 90 CVE QUICKPAS (DISPOSABLE) ×2 IMPLANT
LOOP 2 FIBERLINK CLOSED (SUTURE) IMPLANT
MANIFOLD NEPTUNE II (INSTRUMENTS) ×2 IMPLANT
NDL SUT 6 .5 CRC .975X.05 MAYO (NEEDLE) IMPLANT
NEEDLE MAYO TAPER (NEEDLE)
NS IRRIG 1000ML POUR BTL (IV SOLUTION) IMPLANT
PACK ARTHROSCOPY DSU (CUSTOM PROCEDURE TRAY) ×2 IMPLANT
PACK BASIN DAY SURGERY FS (CUSTOM PROCEDURE TRAY) ×2 IMPLANT
PENCIL BUTTON HOLSTER BLD 10FT (ELECTRODE) ×2 IMPLANT
SET ARTHROSCOPY TUBING (MISCELLANEOUS) ×2
SET ARTHROSCOPY TUBING LN (MISCELLANEOUS) ×1 IMPLANT
SLEEVE SCD COMPRESS KNEE MED (MISCELLANEOUS) ×1 IMPLANT
SLING ARM IMMOBILIZER LRG (SOFTGOODS) IMPLANT
SLING ARM IMMOBILIZER MED (SOFTGOODS) IMPLANT
SLING ARM LRG ADULT FOAM STRAP (SOFTGOODS) ×1 IMPLANT
SLING ARM MED ADULT FOAM STRAP (SOFTGOODS) IMPLANT
SLING ARM XL FOAM STRAP (SOFTGOODS) IMPLANT
SPONGE LAP 4X18 X RAY DECT (DISPOSABLE) IMPLANT
STRIP CLOSURE SKIN 1/2X4 (GAUZE/BANDAGES/DRESSINGS) IMPLANT
SUCTION FRAZIER TIP 10 FR DISP (SUCTIONS) IMPLANT
SUT ETHIBOND 2 OS 4 DA (SUTURE) IMPLANT
SUT ETHILON 2 0 FS 18 (SUTURE) IMPLANT
SUT ETHILON 3 0 PS 1 (SUTURE) ×1 IMPLANT
SUT FIBERWIRE #2 38 T-5 BLUE (SUTURE)
SUT RETRIEVER MED (INSTRUMENTS) IMPLANT
SUT VIC AB 0 CT1 27 (SUTURE)
SUT VIC AB 0 CT1 27XBRD ANBCTR (SUTURE) IMPLANT
SUT VIC AB 2-0 SH 27 (SUTURE)
SUT VIC AB 2-0 SH 27XBRD (SUTURE) IMPLANT
SUT VIC AB 3-0 FS2 27 (SUTURE) IMPLANT
SUTURE FIBERWR #2 38 T-5 BLUE (SUTURE) IMPLANT
SYR BULB 3OZ (MISCELLANEOUS) IMPLANT
TAPE LABRALWHITE 1.5X36 (TAPE) IMPLANT
TAPE SUT LABRALTAP WHT/BLK (SUTURE) IMPLANT
TOWEL OR 17X24 6PK STRL BLUE (TOWEL DISPOSABLE) ×2 IMPLANT
TUBE CONNECTING 20X1/4 (TUBING) IMPLANT
WATER STERILE IRR 1000ML POUR (IV SOLUTION) ×2 IMPLANT
YANKAUER SUCT BULB TIP NO VENT (SUCTIONS) IMPLANT

## 2015-05-20 NOTE — Anesthesia Procedure Notes (Addendum)
Anesthesia Regional Block:  Interscalene brachial plexus block  Pre-Anesthetic Checklist: ,, timeout performed, Correct Patient, Correct Site, Correct Laterality, Correct Procedure, Correct Position, site marked, Risks and benefits discussed,  Surgical consent,  Pre-op evaluation,  At surgeon's request and post-op pain management  Laterality: Right  Prep: chloraprep       Needles:  Injection technique: Single-shot  Needle Type: Echogenic Stimulator Needle     Needle Length: 5cm 5 cm Needle Gauge: 22 and 22 G    Additional Needles:  Procedures: ultrasound guided (picture in chart) and nerve stimulator Interscalene brachial plexus block  Nerve Stimulator or Paresthesia:  Response: bicep contraction, 0.45 mA,   Additional Responses:   Narrative:  Start time: 05/20/2015 10:27 AM End time: 05/20/2015 10:37 AM Injection made incrementally with aspirations every 5 mL.  Performed by: Personally  Anesthesiologist: Duane Boston  Additional Notes: Functioning IV was confirmed and monitors applied.  A 62mm 22ga echogenic arrow stimulator was used. Sterile prep and drape,hand hygiene and sterile gloves were used.Ultrasound guidance: relevant anatomy identified, needle position confirmed, local anesthetic spread visualized around nerve(s)., vascular puncture avoided.  Image printed for medical record.  Negative aspiration and negative test dose prior to incremental administration of local anesthetic. The patient tolerated the procedure well.   Procedure Name: Intubation Date/Time: 05/20/2015 10:57 AM Performed by: Marrianne Mood Pre-anesthesia Checklist: Patient identified, Emergency Drugs available, Suction available, Patient being monitored and Timeout performed Patient Re-evaluated:Patient Re-evaluated prior to inductionOxygen Delivery Method: Circle System Utilized Preoxygenation: Pre-oxygenation with 100% oxygen Intubation Type: IV induction Ventilation: Mask ventilation  without difficulty Laryngoscope Size: Miller and 3 Grade View: Grade II Tube type: Oral Tube size: 7.0 mm Number of attempts: 1 Airway Equipment and Method: Stylet and Oral airway Placement Confirmation: ETT inserted through vocal cords under direct vision,  positive ETCO2 and breath sounds checked- equal and bilateral Secured at: 20 cm Tube secured with: Tape Dental Injury: Teeth and Oropharynx as per pre-operative assessment

## 2015-05-20 NOTE — Anesthesia Postprocedure Evaluation (Signed)
  Anesthesia Post-op Note  Patient: [name]  Procedure(s) Performed: Procedure(s): SHOULDER ARTHROSCOPY WITH DISTAL CLAVICLE RESECTION DEBRIDEMENT PARTIAL ROTATOR CUFF LABRAL TEAR REMOVAL LOOSE BODIES (Right)  Patient Location: PACU  Anesthesia Type:GA combined with regional for post-op pain  Level of Consciousness: awake, alert  and oriented  Airway and Oxygen Therapy: Patient Spontanous Breathing  Post-op Pain: none  Post-op Assessment: Post-op Vital signs reviewed  Post-op Vital Signs: Reviewed  Last Vitals:  Filed Vitals:   05/20/15 1414  BP: 120/74  Pulse: 78  Temp: 36.6 C  Resp: 20    Complications: No apparent anesthesia complications

## 2015-05-20 NOTE — Anesthesia Preprocedure Evaluation (Signed)
Anesthesia Evaluation  Patient identified by MRN, date of birth, ID band Patient awake    Reviewed: NPO status , Patient's Chart, lab work & pertinent test results  History of Anesthesia Complications Negative for: history of anesthetic complications  Airway Mallampati: II  TM Distance: >3 FB Neck ROM: Full    Dental  (+) Edentulous Upper, Dental Advisory Given   Pulmonary sleep apnea ,    Pulmonary exam normal       Cardiovascular hypertension, Normal cardiovascular exam    Neuro/Psych PSYCHIATRIC DISORDERS Anxiety Depression negative neurological ROS     GI/Hepatic Neg liver ROS, hiatal hernia, GERD-  ,  Endo/Other  diabetes  Renal/GU negative Renal ROS     Musculoskeletal   Abdominal   Peds  Hematology   Anesthesia Other Findings   Reproductive/Obstetrics                             Anesthesia Physical Anesthesia Plan  ASA: III  Anesthesia Plan: General   Post-op Pain Management: MAC Combined w/ Regional for Post-op pain   Induction: Intravenous  Airway Management Planned: Oral ETT  Additional Equipment:   Intra-op Plan:   Post-operative Plan:   Informed Consent: I have reviewed the patients History and Physical, chart, labs and discussed the procedure including the risks, benefits and alternatives for the proposed anesthesia with the patient or authorized representative who has indicated his/her understanding and acceptance.   Dental advisory given  Plan Discussed with: CRNA, Anesthesiologist and Surgeon  Anesthesia Plan Comments:         Anesthesia Quick Evaluation

## 2015-05-20 NOTE — Progress Notes (Signed)
Assisted Dr. Singer with right, ultrasound guided, interscalene  block. Side rails up, monitors on throughout procedure. See vital signs in flow sheet. Tolerated Procedure well. 

## 2015-05-20 NOTE — Discharge Instructions (Signed)
Shouder arthroscopy, partial rotator cuff tear debridement subacromial decompression Care After Instructions Refer to this sheet in the next few weeks. These discharge instructions provide you with general information on caring for yourself after you leave the hospital. Your caregiver may also give you specific instructions. Your treatment has been planned according to the most current medical practices available, but unavoidable complications sometimes occur. If you have any problems or questions after discharge, please call your caregiver. HOME INSTRUCTIONS You may resume a normal diet and activities as directed. Take showers instead of baths until informed otherwise.  Change bandages (dressings) in 3 days.  Swab wounds daily with betadine.  Wash shoulder with soap and water.  Pat dry.  Cover wounds with bandaids. Only take over-the-counter or prescription medicines for pain, discomfort, or fever as directed by your caregiver.  Wear your sling for the next 2 days unless otherwise instructed. Eat a well-balanced diet.  Avoid lifting or driving until you are instructed otherwise.  Make an appointment to see your caregiver for stitches (suture) or staple removal one week after surgery.  SEEK MEDICAL CARE IF: You have swelling of your calf or leg.  You develop shortness of breath or chest pain.  You have redness, swelling, or increasing pain in the wound.  There is pus or any unusual drainage coming from the surgical site.  You notice a bad smell coming from the surgical site or dressing.  The surgical site breaks open after sutures or staples have been removed.  There is persistent bleeding from the suture or staple line.  You are getting worse or are not improving.  You have any other questions or concerns.  SEEK IMMEDIATE MEDICAL CARE IF:  You have a fever greater than 101 You develop a rash.  You have difficulty breathing.  You develop any reaction or side effects to medicines given.    Your knee motion is decreasing rather than improving.  MAKE SURE YOU:  Understand these instructions.  Will watch your condition.  Will get help right away if you are not doing well or get worse.   Regional Anesthesia Blocks  1. Numbness or the inability to move the "blocked" extremity may last from 3-48 hours after placement. The length of time depends on the medication injected and your individual response to the medication. If the numbness is not going away after 48 hours, call your surgeon.  2. The extremity that is blocked will need to be protected until the numbness is gone and the  Strength has returned. Because you cannot feel it, you will need to take extra care to avoid injury. Because it may be weak, you may have difficulty moving it or using it. You may not know what position it is in without looking at it while the block is in effect.  3. For blocks in the legs and feet, returning to weight bearing and walking needs to be done carefully. You will need to wait until the numbness is entirely gone and the strength has returned. You should be able to move your leg and foot normally before you try and bear weight or walk. You will need someone to be with you when you first try to ensure you do not fall and possibly risk injury.  4. Bruising and tenderness at the needle site are common side effects and will resolve in a few days.  5. Persistent numbness or new problems with movement should be communicated to the surgeon or the [facility] [phone]  [facility] [phone]).   Post Anesthesia Home Care Instructions  Activity: Get plenty of rest for the remainder of the day. A responsible adult should stay with you for 24 hours following the procedure.  For the next 24 hours, DO NOT: -Drive a car -Paediatric nurse -Drink alcoholic beverages -Take any medication unless instructed by your physician -Make any legal decisions or sign important  papers.  Meals: Start with liquid foods such as gelatin or soup. Progress to regular foods as tolerated. Avoid greasy, spicy, heavy foods. If nausea and/or vomiting occur, drink only clear liquids until the nausea and/or vomiting subsides. Call your physician if vomiting continues.  Special Instructions/Symptoms: Your throat may feel dry or sore from the anesthesia or the breathing tube placed in your throat during surgery. If this causes discomfort, gargle with warm salt water. The discomfort should disappear within 24 hours.  If you had a scopolamine patch placed behind your ear for the management of post- operative nausea and/or vomiting:  1. The medication in the patch is effective for 72 hours, after which it should be removed.  Wrap patch in a tissue and discard in the trash. Wash hands thoroughly with soap and water. 2. You may remove the patch earlier than 72 hours if you experience unpleasant side effects which may include dry mouth, dizziness or visual disturbances. 3. Avoid touching the patch. Wash your hands with soap and water after contact with the patch.

## 2015-05-20 NOTE — Transfer of Care (Signed)
Immediate Anesthesia Transfer of Care Note  Patient: [name]  Procedure(s) Performed: Procedure(s): SHOULDER ARTHROSCOPY WITH DISTAL CLAVICLE RESECTION DEBRIDEMENT PARTIAL ROTATOR CUFF LABRAL TEAR REMOVAL LOOSE BODIES (Right)  Patient Location: PACU  Anesthesia Type:GA combined with regional for post-op pain  Level of Consciousness: awake, alert  and patient cooperative  Airway & Oxygen Therapy: Patient Spontanous Breathing and Patient connected to face mask oxygen  Post-op Assessment: Report given to RN and Post -op Vital signs reviewed and stable  Post vital signs: Reviewed and stable  Last Vitals:  Filed Vitals:   05/20/15 1035  BP: 117/71  Pulse:   Temp:   Resp:     Complications: No apparent anesthesia complications

## 2015-05-20 NOTE — Interval H&P Note (Signed)
History and Physical Interval Note:  05/20/2015 7:31 AM  [name]  has presented today for surgery, with the diagnosis of displaced fracture if glenoid cavity if scapula , right Shoulder, other articular cartilage disorder, right shoulder, primary osteoarthritis right shoulder bursitis of right shoulder   The various methods of treatment have been discussed with the patient and family. After consideration of risks, benefits and other options for treatment, the patient has consented to  Procedure(s): RIGHT SHOULDER ARTHROSCOPY DEBRIDEMENT DISTAL CLAVICAL EXCISION ACROMIOPLASTY ROTATOR CUFF REPAIR BANKART  (Right) as a surgical intervention .  The patient's history has been reviewed, patient examined, no change in status, stable for surgery.  I have reviewed the patient's chart and labs.  Questions were answered to the patient's satisfaction.     MURPHY,DANIEL F

## 2015-05-21 ENCOUNTER — Encounter (HOSPITAL_BASED_OUTPATIENT_CLINIC_OR_DEPARTMENT_OTHER): Payer: Self-pay | Admitting: Orthopedic Surgery

## 2015-05-21 NOTE — Op Note (Signed)
[name], [name]                 ACCOUNT NO.:  [id number]  MEDICAL RECORD NO.:  [id number]  LOCATION:                                FACILITY:  [facility]  PHYSICIAN:  Ninetta Lights, M.D. DATE OF BIRTH:  Mar 11, 1946  DATE OF PROCEDURE:  05/20/2015 DATE OF DISCHARGE:  05/20/2015                              OPERATIVE REPORT   PREOPERATIVE DIAGNOSES:  Traumatic injury right shoulder. Anteroinferior nondisplaced glenoid fracture.  Question anterior Bankart lesion.  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  POSTOPERATIVE DIAGNOSES:  Traumatic injury right shoulder. Anteroinferior nondisplaced glenoid fracture.  Question anterior Bankart lesion.  Posttraumatic impingement with partial tear rotator cuff above and below as well as acromioclavicular arthropathy with a just about healed glenoid fracture.  Two small osteochondral loose bodies.  Not a significant soft tissue Bankart injury.  Marked attrition partial tearing rotator cuff above and below but nothing full-thickness. Impingement.  Acromioclavicular arthropathy.  Mild adhesive capsulitis.  PROCEDURE:  Right shoulder exam under anesthesia, arthroscopy. Debridement of rotator cuff above and below.  Removal of loose bodies. Bursectomy, acromioplasty, coracoacromial ligament release.  Excision of distal clavicle.  Lysis and debridement of adhesions with gentle manipulation.  SURGEON:  Ninetta Lights, MD  ASSISTANT:  Elmyra Ricks, PA, present throughout the entire case and necessary for timely completion of procedure.  ANESTHESIA:  General.  BLOOD LOSS:  Minimal.  SPECIMENS:  None.  CULTURES:  None.  COMPLICATIONS:  None.  DRESSINGS:  Soft compressive with sling.  DESCRIPTION OF PROCEDURE:  The patient was brought to the operating room and after adequate anesthesia had been obtained, right shoulder examined.  About 10% loss of motion.  Gently  manipulated, achieving full motion and no significant instability.  Placed in a beach-chair position with a  shoulder positioner and prepped and draped in usual sterile fashion.  Three portals anterior, lateral, and posteriorly.  Arthroscope introduced, shoulder distended and inspected.  Glenoid fracture which was the anteroinferior quadrant had pretty much healed.  No diastasis, no step-off.  Ligaments attached to the fragment.  A little bit disruption above that up near the top of the glenoid where there are two partially tethered loose bodies.  Loose bodies removed.  The joint debrided.  I did not feel there was any sufficient need to treat the fracture or any other reconstruction as there was not demonstrable instability.  Biceps tendon and biceps anchor intact.  A lot of attrition throughout the supraspinatus above and below but no full- thickness tears.  Cannula redirected subacromially.  Adhesions debrided. Bursa was resected.  Cuff debrided.  Acromioplasty from type 2 to type 1 acromion releasing CA ligament.  Grade 4 changes with marked spurring AC joint.  Periarticular spurs lateral centimeter of clavicle resected. Adequacy of decompression confirmed viewing from all portals. Instruments were fully removed.  Portals were closed with nylon. Sterile compressive dressing applied.  Sling applied.  Anesthesia reversed.  Brought to the recovery room.  Tolerated the surgery well, no complications.     Ninetta Lights, M.D.     DFM/MEDQ  D:  05/20/2015  T:  05/21/2015  Job:  [id number]

## 2015-05-28 ENCOUNTER — Ambulatory Visit (HOSPITAL_COMMUNITY): Payer: Commercial Managed Care - HMO | Attending: Orthopedic Surgery

## 2015-05-28 ENCOUNTER — Encounter (HOSPITAL_COMMUNITY): Payer: Self-pay

## 2015-05-28 DIAGNOSIS — R29898 Other symptoms and signs involving the musculoskeletal system: Secondary | ICD-10-CM | POA: Diagnosis not present

## 2015-05-28 DIAGNOSIS — M629 Disorder of muscle, unspecified: Secondary | ICD-10-CM | POA: Diagnosis not present

## 2015-05-28 DIAGNOSIS — M25611 Stiffness of right shoulder, not elsewhere classified: Secondary | ICD-10-CM | POA: Insufficient documentation

## 2015-05-28 DIAGNOSIS — M25511 Pain in right shoulder: Secondary | ICD-10-CM | POA: Diagnosis not present

## 2015-05-28 DIAGNOSIS — Z9889 Other specified postprocedural states: Secondary | ICD-10-CM

## 2015-05-28 DIAGNOSIS — M6289 Other specified disorders of muscle: Secondary | ICD-10-CM

## 2015-05-28 DIAGNOSIS — M6281 Muscle weakness (generalized): Secondary | ICD-10-CM | POA: Diagnosis not present

## 2015-05-28 NOTE — Therapy (Signed)
[facility] [place], [place], [address] Phone: [phone]   Fax:  [phone]  Occupational Therapy Evaluation  Patient Details  Name: [name] MRN: [id number] Date of Birth: May 17, 1946 Referring Provider:  Kathryne Hitch, MD  Encounter Date: 05/28/2015      OT End of Session - 05/28/15 1531    Visit Number 1   Number of Visits 24   Date for OT Re-Evaluation 07/27/15  mini reassessment: 06/25/15   Authorization Type [employer]   Authorization Time Period before 10th visit   Authorization - Visit Number 1   Authorization - Number of Visits 10   OT Start Time 3149   OT Stop Time 1445   OT Time Calculation (min) 50 min   Activity Tolerance Patient tolerated treatment well   Behavior During Therapy [facility] for tasks assessed/performed      Past Medical History  Diagnosis Date  . H/O hiatal hernia   . Chronic back pain   . Thyroid disease     takes Synthroid daily  . Allergy     SEASONAL-flonase daily as well as Claritin  . Arthritis     BACK/KNEE  . Depression     takes Wellbutrin daily  . Anxiety     takes Celexa every evening  . Anemia     takes Ferrous Sulfate daily  . Muscle spasm     takes Robaxin daily as needed  . Nausea     takes Zofran daily as needed  . Hyperlipidemia     takes Pravastatin daily as well as Questran  . GERD (gastroesophageal reflux disease)     takes Omeprazole daily and Zantac at bedtime  . Insomnia     takes trazodone nightly  . Hypertension     takes Diovan daily  . Fibromyalgia   . Joint pain   . Chronic back pain     L1 FRACTURE  . History of colon polyps   . Cataract     IMMATURE AND ON LEFT EYE SHE THINKS  . Numbness and tingling     LEGS  . Nocturia   . History of shingles   . Sleep apnea     CPAP-SLEEP STUDY DONE > 5 YRS AGO  . Diabetes mellitus without complication   . Osteoporosis     takes fosamax    Past Surgical History  Procedure Laterality  Date  . Abdominal hysterectomy    . Cholecystectomy    . Tonsillectomy    . Rectocele repair    . 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 Colonoscopy    . Upper gastrointestinal endoscopy  02/2013  . Carpal tunnel release      right and lt  . Fracture surgery      rt hand  . Fracture surgery      lr wrist  . Orif finger fracture  04/11/2012    Procedure: OPEN REDUCTION INTERNAL FIXATION (ORIF) METACARPAL (FINGER) FRACTURE;  Surgeon: Ninetta Lights, MD;  Location: [facility];  Service: Orthopedics;  Laterality: Left;  orif left hand 4th metacarpal   . Hand surgery Right 06/1985  . Hand surgery Left   . Wrist surgery Left   . Knee arthroscopy Left   . Foot surgery Bilateral   . Cardiac catheterization    . Colonoscopy    . Kyphoplasty N/A 12/21/2014    Procedure: KYPHOPLASTY LUMBAR ONE;  Surgeon: Newman Pies, MD;  Location: [facility];  Service: Neurosurgery;  Laterality: N/A;  L1 Kyphoplasty  . Shoulder arthroscopy with distal clavicle resection Right 05/20/2015    Procedure: SHOULDER ARTHROSCOPY WITH DISTAL CLAVICLE RESECTION DEBRIDEMENT PARTIAL ROTATOR CUFF LABRAL TEAR REMOVAL LOOSE BODIES;  Surgeon: Kathryne Hitch, MD;  Location: [facility];  Service: Orthopedics;  Laterality: Right;    There were no vitals filed for this visit.  Visit Diagnosis:  S/P arthroscopy of shoulder - Plan: Ot plan of care cert/re-cert  Tight fascia - Plan: Ot plan of care cert/re-cert  Pain in joint, shoulder region, right - Plan: Ot plan of care cert/re-cert  Weakness of shoulder - Plan: Ot plan of care cert/re-cert  Stiffness of joint, shoulder region, right - Plan: Ot plan of care cert/re-cert      Subjective Assessment - 05/28/15 1359    Subjective  S: I've been moving it a little here and there to try and stretch it.    Pertinent History Patient is a 69 y/o female returning to outpatient department after previously attending occupational  therapy for right shoulder impingement, anterior subluxation, and bankart lesion. Today patient presents to clinic s/p right shoulder arthroscopic decompression which was performed on 05/19/15. Pt states that the MD discovered that she had a small fracture and bone spurs as well. Patient is not currently wearing a sling although she states she may where it in public to increase awareness of her recent sx. Dr. Percell Miller has referred to occupational therapy for evaluation and treatment.    Limitations Protocol: Day 1-14 (Until 06/03/15) Pendulums, AAROM, pulleys, capsular stretches, isometrics. Progress to AROM  once nearly perfect ROM, minimal pain, good strength. UBE bikes, rythmic stabilization. Progress to strengthening phrase when no pain during ROM. Strengthening phrase: Week 6-12 (07/01/15-08/12/15)   Special Tests To be completed next session   Patient Stated Goals To be able to move my arm again.   Currently in Pain? Yes   Pain Score 8    Pain Location Shoulder   Pain Orientation Right   Pain Descriptors / Indicators Throbbing   Pain Type Surgical pain           [facility] OT Assessment - 05/28/15 1402    Assessment   Diagnosis Right shoulder arthroscopic decompression   Onset Date 05/19/15   Prior Therapy Yes here for shoulder impingement   Precautions   Precautions Shoulder   Restrictions   Weight Bearing Restrictions Yes   Balance Screen   Has the patient fallen in the past 6 months Yes   How many times? 1   Has the patient had a decrease in activity level because of a fear of falling?  No   Is the patient reluctant to leave their home because of a fear of falling?  No   Home  Environment   Family/patient expects to be discharged to: Private residence   Living Arrangements Spouse/significant other   Available Help at Discharge Available 24 hours/day   Type of Home House   Lives With Spouse   Prior Function   Level of Independence Independent with basic ADLs   Vocation Retired    Leisure crafts-wreaths, Management consultant, Animal nutritionist   ADL   ADL comments Difficulty with toilet hygiene, donning/doffing shirt,reaching into cabinet,     Mobility   Mobility Status History of falls   Written Expression   Dominant Hand Right   Vision - History   Baseline Vision Wears glasses all the time   Cognition   Overall  Cognitive Status Within Functional Limits for tasks assessed   ROM / Strength   AROM / PROM / Strength AROM;PROM;Strength   Palpation   Palpation comment Max fascial restriction in right upper arm, trapezius, and scapularis region.    AROM   Overall AROM Comments Assessed in standing, ER/IR adducted   AROM Assessment Site Shoulder   Right/Left Shoulder Right   Right Shoulder Flexion 105 Degrees   Right Shoulder ABduction 0 Degrees   Right Shoulder Internal Rotation 80 Degrees   Right Shoulder External Rotation 40 Degrees   PROM   Overall PROM Comments Assessed in supine, ER/IR adducted   PROM Assessment Site Shoulder   Right/Left Shoulder Right   Right Shoulder Flexion 140 Degrees   Right Shoulder ABduction 65 Degrees   Right Shoulder Internal Rotation 90 Degrees   Right Shoulder External Rotation 60 Degrees   Strength   Overall Strength Unable to assess;Due to precautions;Due to pain   Strength Assessment Site Shoulder   Right/Left Shoulder Right                         OT Education - 05/28/15 1530    Education provided Yes   Education Details table slides, pendulums, AROM elbow and wrist   Person(s) Educated Patient   Methods Explanation;Demonstration;Handout   Comprehension Verbalized understanding;Returned demonstration          OT Short Term Goals - 05/28/15 1535    OT SHORT TERM GOAL #1   Title Pt will be educated on HEP.    Time 6   Period Weeks   Status New   OT SHORT TERM GOAL #2   Title Patient will decrease pain to 5/10 during daily tasks.    Time 6   Period Weeks   Status New   OT SHORT TERM GOAL #3    Title Pt will decrease fascial restrictions from max to mod amount.    Time 6   Period Weeks   Status New   OT SHORT TERM GOAL #4   Title Patient will increase PROM of RUE to [facility] to increase ability to donn/doff shirts with less difficulty.    Time 6   Period Weeks   Status New   OT SHORT TERM GOAL #5   Title Pt will increase strength to 3/5 to increase ability to move and work with craft items.    Time 6   Period Weeks   Status New           OT Long Term Goals - 05/28/15 1539    OT LONG TERM GOAL #1   Title Patient will return to highest level of independence will all daily and leisure tasks.    Time 12   Period Weeks   Status New   OT LONG TERM GOAL #2   Title Patient will decrease fascial restrctions to min amount.    Time 12   Period Weeks   Status New   OT LONG TERM GOAL #3   Title Patient will increase RUE strength to 4+/5 to increase ability to lift craft items.   Time 12   Period Weeks   Status New   OT LONG TERM GOAL #4   Title Patient will increase AROM to [facility] to increase ability to reach into overhead cabinets.    Time 12   Period Weeks   Status New   OT LONG TERM GOAL #5   Title Patient will decrease pain to 3/10  or less during daily tasks.    Time 12   Period Weeks   Status New               Plan - 2015-06-07 1532    Clinical Impression Statement A: Patient is a 69 y/o female s/p right shoulder arthroscopic decompression causing increased pain and fascial restrctions and decreased strength and ROM resulting in difficulty completing all daily tasks using right arm as dominant extremity.    Pt will benefit from skilled therapeutic intervention in order to improve on the following deficits (Retired) Decreased range of motion;Increased edema;Increased fascial restricitons;Pain;Impaired UE functional use;Decreased strength   Rehab Potential Excellent   OT Frequency 2x / week   OT Duration 12 weeks   OT Treatment/Interventions Self-care/ADL  training;Therapeutic exercise;Patient/family education;Manual Therapy;Therapeutic activities;Cryotherapy;Moist Heat;Electrical Stimulation;Passive range of motion   Plan P: Patient will benefit from skilled OT services to increase functional performance during daily tasks using right arm as dominant extremity. Treatment Plan: FOLLOW PROTOCOL.  Myofascial release, muscle energy technique, passive stretching, AAROM, AROM, general strengthening.    Consulted and Agree with Plan of Care Patient          G-Codes - Jun 07, 2015 1541    Functional Assessment Tool Used clinical judgement   Functional Limitation Carrying, moving and handling objects   Carrying, Moving and Handling Objects Current Status [phone]) At least 60 percent but less than 80 percent impaired, limited or restricted   Carrying, Moving and Handling Objects Goal Status (R1165) At least 20 percent but less than 40 percent impaired, limited or restricted      Problem List Patient Active Problem List   Diagnosis Date Noted  . Lumbar compression fracture 12/21/2014  . Morbid obesity 03/07/2013  . Obstructive sleep apnea 03/07/2013  . Fibromyalgia 03/07/2013  . Other and unspecified hyperlipidemia 03/07/2013  . Hx of gastritis 03/07/2013  . S/P cholecystectomy 03/07/2013  . Chest pain 01/07/2013    Ailene Ravel, OTR/L,CBIS  [phone]  Jun 07, 2015, 3:44 PM  [facility] [address] [place], [place], [address] Phone: [phone]   Fax:  [phone]

## 2015-05-28 NOTE — Patient Instructions (Signed)
TOWEL SLIDES COMPLETE FOR 1-3 MINUTES, 3-5 TIMES PER DAY  SHOULDER: Flexion On Table   Place hands on table, elbows straight. Move hips away from body. Press hands down into table. Hold ___ seconds. ___ reps per set, ___ sets per day, ___ days per week  Abduction (Passive)   With arm out to side, resting on table, lower head toward arm, keeping trunk away from table. Hold ____ seconds. Repeat ____ times. Do ____ sessions per day.  Copyright  VHI. All rights reserved.     Internal Rotation (Assistive)   Seated with elbow bent at right angle and held against side, slide arm on table surface in an inward arc. Repeat ____ times. Do ____ sessions per day. Activity: Use this motion to brush crumbs off the table.  Copyright  VHI. All rights reserved.    COMPLETE PENDULUM EXERCISES FOR 30 SECONDS TO A MINUTE EACH, 3-5 TIMES PER DAY. ROM: Pendulum (Side-to-Side)   Deje que el brazo derecho se balancee suavemente de lado a lado mientras oscila el cuerpo de [name]. Repita ____ veces por rutina. Realice ____ rutinas por sesin. Realice ____ sesiones por da.  http://orth.exer.us/792   Copyright  VHI. All rights reserved.  Pendulum Forward/Back   Bend forward 90 at waist, using table for support. Rock body forward and back to swing arm. Repeat ____ times. Do ____ sessions per day.  Copyright  VHI. All rights reserved.  Pendulum Circular   Bend forward 90 at waist, leaning on table for support. Rock body in a circular pattern to move arm clockwise ____ times then counterclockwise ____ times. Do ____ sessions per day.  Copyright  VHI. All rights reserved.  AROM: Wrist Extension   With right palm down, bend wrist up. Repeat 10____ times per set. Do ____ sets per session. Do __3__ sessions per day.  Copyright  VHI. All rights reserved.   AROM: Wrist Flexion   With right palm up, bend wrist up. Repeat ___10_ times per set. Do ____ sets per session. Do __3__  sessions per day.  Copyright  VHI. All rights reserved.   AROM: Forearm Pronation / Supination   With right arm in handshake position, slowly rotate palm down until stretch is felt. Relax. Then rotate palm up until stretch is felt. Repeat __10__ times per set. Do ____ sets per session. Do __3__ sessions per day.  Copyright  VHI. All rights reserved.

## 2015-05-31 ENCOUNTER — Ambulatory Visit (HOSPITAL_COMMUNITY): Payer: Commercial Managed Care - HMO

## 2015-05-31 ENCOUNTER — Encounter (HOSPITAL_COMMUNITY): Payer: Self-pay

## 2015-05-31 DIAGNOSIS — R29898 Other symptoms and signs involving the musculoskeletal system: Secondary | ICD-10-CM

## 2015-05-31 DIAGNOSIS — M25611 Stiffness of right shoulder, not elsewhere classified: Secondary | ICD-10-CM

## 2015-05-31 DIAGNOSIS — M6289 Other specified disorders of muscle: Secondary | ICD-10-CM

## 2015-05-31 DIAGNOSIS — M629 Disorder of muscle, unspecified: Secondary | ICD-10-CM

## 2015-05-31 NOTE — Therapy (Signed)
[place], [place], [address] Phone: [phone]   Fax:  [phone]  Occupational Therapy Treatment  Patient Details  Name: [name] MRN: [id number] Date of Birth: [birth year] Referring Provider:  Kathryne Hitch, MD  Encounter Date: 05/31/2015      OT End of Session - 05/31/15 1240    Visit Number 2   Number of Visits 24   Date for OT Re-Evaluation 07/27/15  mini reassessment: 06/25/15   Authorization Type [employer]   Authorization Time Period before 10th visit   Authorization - Visit Number 2   Authorization - Number of Visits 10   OT Start Time 1152   OT Stop Time 1230   OT Time Calculation (min) 38 min   Activity Tolerance Patient tolerated treatment well   Behavior During Therapy [facility] for tasks assessed/performed      Past Medical History  Diagnosis Date  . H/O hiatal hernia   . Chronic back pain   . Thyroid disease     takes Synthroid daily  . Allergy     SEASONAL-flonase daily as well as Claritin  . Arthritis     BACK/KNEE  . Depression     takes Wellbutrin daily  . Anxiety     takes Celexa every evening  . Anemia     takes Ferrous Sulfate daily  . Muscle spasm     takes Robaxin daily as needed  . Nausea     takes Zofran daily as needed  . Hyperlipidemia     takes Pravastatin daily as well as Questran  . GERD (gastroesophageal reflux disease)     takes Omeprazole daily and Zantac at bedtime  . Insomnia     takes trazodone nightly  . Hypertension     takes Diovan daily  . Fibromyalgia   . Joint pain   . Chronic back pain     L1 FRACTURE  . History of colon polyps   . Cataract     IMMATURE AND ON LEFT EYE SHE THINKS  . Numbness and tingling     LEGS  . Nocturia   . History of shingles   . Sleep apnea     CPAP-SLEEP STUDY DONE > 5 YRS AGO  . Diabetes mellitus without complication   . Osteoporosis     takes fosamax    Past Surgical History  Procedure Laterality  Date  . Abdominal hysterectomy    . Cholecystectomy    . Tonsillectomy    . Rectocele repair    . 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 Colonoscopy    . Upper gastrointestinal endoscopy  02/2013  . Carpal tunnel release      right and lt  . Fracture surgery      rt hand  . Fracture surgery      lr wrist  . Orif finger fracture  04/11/2012    Procedure: OPEN REDUCTION INTERNAL FIXATION (ORIF) METACARPAL (FINGER) FRACTURE;  Surgeon: Ninetta Lights, MD;  Location: [place];  Service: Orthopedics;  Laterality: Left;  orif left hand 4th metacarpal   . Hand surgery Right 06/1985  . Hand surgery Left   . Wrist surgery Left   . Knee arthroscopy Left   . Foot surgery Bilateral   . Cardiac catheterization    . Colonoscopy    . Kyphoplasty N/A 12/21/2014    Procedure: KYPHOPLASTY LUMBAR ONE;  Surgeon: Newman Pies, MD;  Location: [place] [facility];  Service: Neurosurgery;  Laterality: N/A;  L1 Kyphoplasty  . Shoulder arthroscopy with distal clavicle resection Right 05/20/2015    Procedure: SHOULDER ARTHROSCOPY WITH DISTAL CLAVICLE RESECTION DEBRIDEMENT PARTIAL ROTATOR CUFF LABRAL TEAR REMOVAL LOOSE BODIES;  Surgeon: Kathryne Hitch, MD;  Location: [place];  Service: Orthopedics;  Laterality: Right;    There were no vitals filed for this visit.  Visit Diagnosis:  Tight fascia  Weakness of shoulder  Stiffness of joint, shoulder region, right      Subjective Assessment - 05/31/15 1223    Subjective  S: I took a pain pill before I came so I don't hurt right now.    Limitations Protocol: Day 1-14 (Until 06/03/15) Pendulums, AAROM, pulleys, capsular stretches, isometrics. Progress to AROM  once nearly perfect ROM, minimal pain, good strength. UBE bikes, rythmic stabilization. Progress to strengthening phrase when no pain during ROM. Strengthening phrase: Week 6-12 (07/01/15-08/12/15)   Special Tests FOTO score: 48/100   Currently in Pain? No/denies   Took a pain pill prior to therapy            [facility] OT Assessment - 05/31/15 1224    Assessment   Diagnosis Right shoulder arthroscopic decompression   Precautions   Precautions Shoulder                  OT Treatments/Exercises (OP) - 05/31/15 1224    Exercises   Exercises Shoulder   Shoulder Exercises: Supine   Protraction PROM;5 reps   Horizontal ABduction PROM;5 reps   External Rotation PROM;5 reps   Internal Rotation PROM;5 reps   Flexion PROM;5 reps   ABduction PROM;5 reps   Shoulder Exercises: Seated   Elevation AROM;12 reps   Extension AROM;12 reps   Row AROM;12 reps   Shoulder Exercises: Therapy Ball   Flexion 15 reps   ABduction 15 reps   Manual Therapy   Manual Therapy Myofascial release   Myofascial Release myofascial release to right bicep, upper arm, trapezius, and scapularis regions to decrease pain and increase joint range of motion.                   OT Short Term Goals - 05/31/15 1242    OT SHORT TERM GOAL #1   Title Pt will be educated on HEP.    Status On-going   OT SHORT TERM GOAL #2   Title Patient will decrease pain to 5/10 during daily tasks.    Status On-going   OT SHORT TERM GOAL #3   Title Pt will decrease fascial restrictions from max to mod amount.    Status On-going   OT SHORT TERM GOAL #4   Title Patient will increase PROM of RUE to [facility] to increase ability to donn/doff shirts with less difficulty.    Status On-going   OT SHORT TERM GOAL #5   Title Pt will increase strength to 3/5 to increase ability to move and work with craft items.    Status On-going           OT Long Term Goals - 05/31/15 1242    OT LONG TERM GOAL #1   Title Patient will return to highest level of independence will all daily and leisure tasks.    Status On-going   OT LONG TERM GOAL #2   Title Patient will decrease fascial restrctions to min amount.    Status On-going   OT LONG TERM GOAL #3   Title Patient will increase  RUE  strength to 4+/5 to increase ability to lift craft items.   Status On-going   OT LONG TERM GOAL #4   Title Patient will increase AROM to [facility] to increase ability to reach into overhead cabinets.    Status On-going   OT LONG TERM GOAL #5   Title Patient will decrease pain to 3/10 or less during daily tasks.    Status On-going               Plan - 05/31/15 1240    Clinical Impression Statement A: Initiated Myofascial release, PROM, therapy ball, and AROM shoulder exercises. patient tolerated well and reports that she is able to stretch almost to the end of her grap bar at home in the bathroom. Tenderness has decreased compared to initial evaluation.    Plan P: Add isometric exercises and elbow AROM exercises.        Problem List Patient Active Problem List   Diagnosis Date Noted  . Lumbar compression fracture 12/21/2014  . Morbid obesity 03/07/2013  . Obstructive sleep apnea 03/07/2013  . Fibromyalgia 03/07/2013  . Other and unspecified hyperlipidemia 03/07/2013  . Hx of gastritis 03/07/2013  . S/P cholecystectomy 03/07/2013  . Chest pain 01/07/2013    Ailene Ravel, OTR/L,CBIS  [phone]  05/31/2015, 12:45 PM  [facility] [address] [place], [place], [address] Phone: [phone]   Fax:  [phone]

## 2015-06-04 ENCOUNTER — Ambulatory Visit (HOSPITAL_COMMUNITY): Payer: Commercial Managed Care - HMO

## 2015-06-04 ENCOUNTER — Telehealth (HOSPITAL_COMMUNITY): Payer: Self-pay

## 2015-06-04 NOTE — Telephone Encounter (Signed)
She is not feeling well today °

## 2015-06-08 ENCOUNTER — Ambulatory Visit (HOSPITAL_COMMUNITY): Payer: Commercial Managed Care - HMO | Admitting: Occupational Therapy

## 2015-06-09 ENCOUNTER — Ambulatory Visit (HOSPITAL_COMMUNITY): Payer: Commercial Managed Care - HMO | Admitting: Specialist

## 2015-06-09 DIAGNOSIS — M25611 Stiffness of right shoulder, not elsewhere classified: Secondary | ICD-10-CM | POA: Diagnosis not present

## 2015-06-09 DIAGNOSIS — M629 Disorder of muscle, unspecified: Secondary | ICD-10-CM

## 2015-06-09 DIAGNOSIS — R29898 Other symptoms and signs involving the musculoskeletal system: Secondary | ICD-10-CM

## 2015-06-09 DIAGNOSIS — M6289 Other specified disorders of muscle: Secondary | ICD-10-CM

## 2015-06-09 NOTE — Therapy (Signed)
[place], [place], [address] Phone: [phone]   Fax:  [phone]  Occupational Therapy Treatment  Patient Details  Name: [name] MRN: [id number] Date of Birth: 1946-08-12 Referring Provider:  Kathryne Hitch, MD  Encounter Date: 06/09/2015      OT End of Session - 06/09/15 1041    Visit Number 3   Number of Visits 24   Date for OT Re-Evaluation 07/27/15  mini reassess on 7/1   Authorization Type [employer]   Authorization Time Period before 10th visit and send progress update to MD   Authorization - Visit Number 3   Authorization - Number of Visits 10   OT Start Time [phone]   OT Stop Time 1016   OT Time Calculation (min) 32 min   Activity Tolerance Patient tolerated treatment well   Behavior During Therapy [facility] for tasks assessed/performed      Past Medical History  Diagnosis Date  . H/O hiatal hernia   . Chronic back pain   . Thyroid disease     takes Synthroid daily  . Allergy     SEASONAL-flonase daily as well as Claritin  . Arthritis     BACK/KNEE  . Depression     takes Wellbutrin daily  . Anxiety     takes Celexa every evening  . Anemia     takes Ferrous Sulfate daily  . Muscle spasm     takes Robaxin daily as needed  . Nausea     takes Zofran daily as needed  . Hyperlipidemia     takes Pravastatin daily as well as Questran  . GERD (gastroesophageal reflux disease)     takes Omeprazole daily and Zantac at bedtime  . Insomnia     takes trazodone nightly  . Hypertension     takes Diovan daily  . Fibromyalgia   . Joint pain   . Chronic back pain     L1 FRACTURE  . History of colon polyps   . Cataract     IMMATURE AND ON LEFT EYE SHE THINKS  . Numbness and tingling     LEGS  . Nocturia   . History of shingles   . Sleep apnea     CPAP-SLEEP STUDY DONE > 5 YRS AGO  . Diabetes mellitus without complication   . Osteoporosis     takes fosamax    Past Surgical History   Procedure Laterality Date  . Abdominal hysterectomy    . Cholecystectomy    . Tonsillectomy    . Rectocele repair    . 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 Colonoscopy    . Upper gastrointestinal endoscopy  02/2013  . Carpal tunnel release      right and lt  . Fracture surgery      rt hand  . Fracture surgery      lr wrist  . Orif finger fracture  04/11/2012    Procedure: OPEN REDUCTION INTERNAL FIXATION (ORIF) METACARPAL (FINGER) FRACTURE;  Surgeon: Ninetta Lights, MD;  Location: [place];  Service: Orthopedics;  Laterality: Left;  orif left hand 4th metacarpal   . Hand surgery Right 06/1985  . Hand surgery Left   . Wrist surgery Left   . Knee arthroscopy Left   . Foot surgery Bilateral   . Cardiac catheterization    . Colonoscopy    . Kyphoplasty N/A 12/21/2014    Procedure: KYPHOPLASTY LUMBAR  ONE;  Surgeon: Newman Pies, MD;  Location: [place] [facility];  Service: Neurosurgery;  Laterality: N/A;  L1 Kyphoplasty  . Shoulder arthroscopy with distal clavicle resection Right 05/20/2015    Procedure: SHOULDER ARTHROSCOPY WITH DISTAL CLAVICLE RESECTION DEBRIDEMENT PARTIAL ROTATOR CUFF LABRAL TEAR REMOVAL LOOSE BODIES;  Surgeon: Kathryne Hitch, MD;  Location: [place];  Service: Orthopedics;  Laterality: Right;    There were no vitals filed for this visit.  Visit Diagnosis:  Tight fascia  Weakness of shoulder  Stiffness of joint, shoulder region, right      Subjective Assessment - 06/09/15 0946    Subjective  S:  I workmy arm when I am not here.   Limitations Protocol: Day 1-14 (Until 06/03/15) Pendulums, AAROM, pulleys, capsular stretches, isometrics. Progress to AROM  once nearly perfect ROM, minimal pain, good strength. UBE bikes, rythmic stabilization. Progress to strengthening phrase when no pain during ROM. Strengthening phrase: Week 6-12 (07/01/15-08/12/15)   Currently in Pain? No/denies            [facility] OT Assessment -  06/09/15 0001    Assessment   Diagnosis Right shoulder arthroscopic decompression   Onset Date 05/19/15   Precautions   Precautions Shoulder                  OT Treatments/Exercises (OP) - 06/09/15 0001    Exercises   Exercises Shoulder   Shoulder Exercises: Supine   Protraction PROM;AAROM;10 reps   Horizontal ABduction PROM;AAROM;10 reps   External Rotation PROM;AAROM;10 reps   Internal Rotation PROM;AAROM;10 reps   Flexion PROM;AAROM;10 reps   ABduction PROM;AAROM;10 reps   Shoulder Exercises: Seated   Protraction AAROM;10 reps   Horizontal ABduction AAROM;10 reps   External Rotation AAROM;10 reps   Internal Rotation AAROM;10 reps   Flexion AAROM;10 reps   Abduction AAROM;10 reps   Shoulder Exercises: Standing   Extension Theraband;10 reps   Theraband Level (Shoulder Extension) Level 3 (Green)   Row [employer] reps   Theraband Level (Shoulder Row) Level 3 (Green)   Retraction Theraband;10 reps   Theraband Level (Shoulder Retraction) Level 3 (Green)   Shoulder Exercises: Therapy Ball   Right/Left 5 reps   Shoulder Exercises: ROM/Strengthening   Wall Wash 1 minute   Prot/Ret//Elev/Dep 1 minute   Manual Therapy   Manual Therapy Myofascial release   Myofascial Release myofascial release to right bicep, upper arm, trapezius, and scapularis regions to decrease pain and increase joint range of motion.                 OT Education - 06/09/15 1002    Education provided Yes   Education Details AAROM in supine and seated   Person(s) Educated Patient   Methods Explanation;Demonstration;Handout   Comprehension Verbalized understanding;Returned demonstration          OT Short Term Goals - 05/31/15 1242    OT SHORT TERM GOAL #1   Title Pt will be educated on HEP.    Status On-going   OT SHORT TERM GOAL #2   Title Patient will decrease pain to 5/10 during daily tasks.    Status On-going   OT SHORT TERM GOAL #3   Title Pt will decrease fascial  restrictions from max to mod amount.    Status On-going   OT SHORT TERM GOAL #4   Title Patient will increase PROM of RUE to [facility] to increase ability to donn/doff shirts with less difficulty.    Status On-going   OT SHORT  TERM GOAL #5   Title Pt will increase strength to 3/5 to increase ability to move and work with craft items.    Status On-going           OT Long Term Goals - 05/31/15 1242    OT LONG TERM GOAL #1   Title Patient will return to highest level of independence will all daily and leisure tasks.    Status On-going   OT LONG TERM GOAL #2   Title Patient will decrease fascial restrctions to min amount.    Status On-going   OT LONG TERM GOAL #3   Title Patient will increase RUE strength to 4+/5 to increase ability to lift craft items.   Status On-going   OT LONG TERM GOAL #4   Title Patient will increase AROM to [facility] to increase ability to reach into overhead cabinets.    Status On-going   OT LONG TERM GOAL #5   Title Patient will decrease pain to 3/10 or less during daily tasks.    Status On-going               Plan - 06/09/15 1042    Clinical Impression Statement A:  Patient able to complete AAROM in supine and seated this date, as well as theraband for scapular strengthening   Plan P:  Attempt AROM in supine and seated - if AROM near [facility], discuss decreasing to 1 time per week, per patient request.        Problem List Patient Active Problem List   Diagnosis Date Noted  . Lumbar compression fracture 12/21/2014  . Morbid obesity 03/07/2013  . Obstructive sleep apnea 03/07/2013  . Fibromyalgia 03/07/2013  . Other and unspecified hyperlipidemia 03/07/2013  . Hx of gastritis 03/07/2013  . S/P cholecystectomy 03/07/2013  . Chest pain 01/07/2013    Vangie Bicker, OTR/L [phone]  06/09/2015, 10:45 AM  [place], [place], [address] Phone: [phone]   Fax:   [phone]

## 2015-06-09 NOTE — Patient Instructions (Signed)
Perform each exercise ________ reps. 2-3x days.   WAND PRESS - STANDING  Start by holding a wand or cane at chest height.  Next, slowly push the wand outwards in front of your body so that your elbows become fully straightened. Then, return to the original position.     WAND FLEXION - STANDING - PALMS UP  In the standing position, hold a wand/cane with both arms, palms up on both sides. Raise up the wand/cane allowing your unaffected arm to perform most of the effort. Your affected arm should be partially relaxed.      WAND ROTATION - STANDING  In the standing position, hold a wand/cane with both hands keeping your elbows bent. Move your arms and wand/cane to one side.  Your affected arm should be partially relaxed while your unaffected arm performs most of the effort.       WAND ABDUCTION - STANDING  While holding a wand/cane palm face up on the injured side and palm face down on the uninjured side, slowly raise up your injured arm to the side.   Horizontal Abduction/Adduction               Cane Horizontal - Standing   Straight arms holding cane at shoulder height, bring cane to right, center, left. Repeat starting to left.   Copyright  V[employer]. All rights reserved.

## 2015-06-10 ENCOUNTER — Encounter (HOSPITAL_COMMUNITY): Payer: Self-pay | Admitting: Occupational Therapy

## 2015-06-10 ENCOUNTER — Ambulatory Visit (HOSPITAL_COMMUNITY): Payer: Commercial Managed Care - HMO | Admitting: Occupational Therapy

## 2015-06-10 DIAGNOSIS — M25611 Stiffness of right shoulder, not elsewhere classified: Secondary | ICD-10-CM

## 2015-06-10 DIAGNOSIS — M25511 Pain in right shoulder: Secondary | ICD-10-CM

## 2015-06-10 DIAGNOSIS — R29898 Other symptoms and signs involving the musculoskeletal system: Secondary | ICD-10-CM

## 2015-06-10 DIAGNOSIS — M629 Disorder of muscle, unspecified: Secondary | ICD-10-CM

## 2015-06-10 DIAGNOSIS — M6289 Other specified disorders of muscle: Secondary | ICD-10-CM

## 2015-06-10 NOTE — Therapy (Signed)
[place], [place], [address] Phone: [phone]   Fax:  [phone]  Occupational Therapy Treatment  Patient Details  Name: [name] MRN: [id number] Date of Birth: 1946-03-12 Referring Provider:  Kathryne Hitch, MD  Encounter Date: 06/10/2015      OT End of Session - 06/10/15 1147    Visit Number 4   Number of Visits 24   Date for OT Re-Evaluation 07/27/15  mini reassess on 7/1   Authorization Type [employer]   Authorization Time Period before 10th visit and send progress update to MD   Authorization - Visit Number 4   Authorization - Number of Visits 10   OT Start Time 1101   OT Stop Time 1142   OT Time Calculation (min) 41 min   Activity Tolerance Patient tolerated treatment well   Behavior During Therapy [facility] for tasks assessed/performed      Past Medical History  Diagnosis Date  . H/O hiatal hernia   . Chronic back pain   . Thyroid disease     takes Synthroid daily  . Allergy     SEASONAL-flonase daily as well as Claritin  . Arthritis     BACK/KNEE  . Depression     takes Wellbutrin daily  . Anxiety     takes Celexa every evening  . Anemia     takes Ferrous Sulfate daily  . Muscle spasm     takes Robaxin daily as needed  . Nausea     takes Zofran daily as needed  . Hyperlipidemia     takes Pravastatin daily as well as Questran  . GERD (gastroesophageal reflux disease)     takes Omeprazole daily and Zantac at bedtime  . Insomnia     takes trazodone nightly  . Hypertension     takes Diovan daily  . Fibromyalgia   . Joint pain   . Chronic back pain     L1 FRACTURE  . History of colon polyps   . Cataract     IMMATURE AND ON LEFT EYE SHE THINKS  . Numbness and tingling     LEGS  . Nocturia   . History of shingles   . Sleep apnea     CPAP-SLEEP STUDY DONE > 5 YRS AGO  . Diabetes mellitus without complication   . Osteoporosis     takes fosamax    Past Surgical History   Procedure Laterality Date  . Abdominal hysterectomy    . Cholecystectomy    . Tonsillectomy    . Rectocele repair    . 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 Colonoscopy    . Upper gastrointestinal endoscopy  02/2013  . Carpal tunnel release      right and lt  . Fracture surgery      rt hand  . Fracture surgery      lr wrist  . Orif finger fracture  04/11/2012    Procedure: OPEN REDUCTION INTERNAL FIXATION (ORIF) METACARPAL (FINGER) FRACTURE;  Surgeon: Ninetta Lights, MD;  Location: [place];  Service: Orthopedics;  Laterality: Left;  orif left hand 4th metacarpal   . Hand surgery Right 06/1985  . Hand surgery Left   . Wrist surgery Left   . Knee arthroscopy Left   . Foot surgery Bilateral   . Cardiac catheterization    . Colonoscopy    . Kyphoplasty N/A 12/21/2014    Procedure: KYPHOPLASTY LUMBAR  ONE;  Surgeon: Tressie Stalker, MD;  Location: [facility];  Service: Neurosurgery;  Laterality: N/A;  L1 Kyphoplasty  . Shoulder arthroscopy with distal clavicle resection Right 05/20/2015    Procedure: SHOULDER ARTHROSCOPY WITH DISTAL CLAVICLE RESECTION DEBRIDEMENT PARTIAL ROTATOR CUFF LABRAL TEAR REMOVAL LOOSE BODIES;  Surgeon: Mckinley Jewel, MD;  Location: [facility];  Service: Orthopedics;  Laterality: Right;    There were no vitals filed for this visit.  Visit Diagnosis:  Tight fascia  Weakness of shoulder  Stiffness of joint, shoulder region, right  Pain in joint, shoulder region, right  Decreased range of motion of shoulder, right      Subjective Assessment - 06/10/15 1105    Subjective  S: I worked on my car for an hour yesterday afternoon.    Currently in Pain? Yes   Pain Score 5    Pain Location Shoulder   Pain Orientation Right   Pain Descriptors / Indicators Aching   Pain Type Acute pain            [facility] OT Assessment - 06/10/15 1105    Assessment   Diagnosis Right shoulder arthroscopic decompression    Precautions   Precautions Shoulder                  OT Treatments/Exercises (OP) - 06/10/15 1106    Exercises   Exercises Shoulder   Shoulder Exercises: Supine   Protraction PROM;5 reps;AROM;10 reps   Horizontal ABduction PROM;5 reps;AROM;10 reps   External Rotation PROM;5 reps;AROM;10 reps   Internal Rotation PROM;5 reps;AROM;10 reps   Flexion PROM;5 reps;AROM;10 reps   ABduction PROM;5 reps;AROM;10 reps                              Shoulder Exercises: Standing   Protraction AROM;10 reps   Horizontal ABduction AROM;10 reps   External Rotation AROM;10 reps   Internal Rotation AROM;10 reps   Flexion AROM;10 reps   ABduction AROM;10 reps   Extension Theraband;15 reps   Theraband Level (Shoulder Extension) Level 2 (Red)   Row Theraband;15 reps   Theraband Level (Shoulder Row) Level 2 (Red)   Retraction Theraband;15 reps   Theraband Level (Shoulder Retraction) Level 2 (Red)   Shoulder Exercises: ROM/Strengthening   Wall Wash 1\' 30"    Proximal Shoulder Strengthening, Supine 10X   Proximal Shoulder Strengthening, Seated 10X   Prot/Ret//Elev/Dep 1 minute   Manual Therapy   Manual Therapy Myofascial release   Myofascial Release myofascial release to right bicep, upper arm, trapezius, and scapularis regions to decrease pain and increase joint range of motion.                 OT Education - 06/09/15 1002    Education provided Yes   Education Details AAROM in supine and seated   Person(s) Educated Patient   Methods Explanation;Demonstration;Handout   Comprehension Verbalized understanding;Returned demonstration          OT Short Term Goals - 05/31/15 1242    OT SHORT TERM GOAL #1   Title Pt will be educated on HEP.    Status On-going   OT SHORT TERM GOAL #2   Title Patient will decrease pain to 5/10 during daily tasks.    Status On-going   OT SHORT TERM GOAL #3   Title Pt will decrease fascial restrictions from max to mod amount.    Status  On-going   OT SHORT TERM GOAL #4  Title Patient will increase PROM of RUE to [facility] to increase ability to donn/doff shirts with less difficulty.    Status On-going   OT SHORT TERM GOAL #5   Title Pt will increase strength to 3/5 to increase ability to move and work with craft items.    Status On-going           OT Long Term Goals - 05/31/15 1242    OT LONG TERM GOAL #1   Title Patient will return to highest level of independence will all daily and leisure tasks.    Status On-going   OT LONG TERM GOAL #2   Title Patient will decrease fascial restrctions to min amount.    Status On-going   OT LONG TERM GOAL #3   Title Patient will increase RUE strength to 4+/5 to increase ability to lift craft items.   Status On-going   OT LONG TERM GOAL #4   Title Patient will increase AROM to [facility] to increase ability to reach into overhead cabinets.    Status On-going   OT LONG TERM GOAL #5   Title Patient will decrease pain to 3/10 or less during daily tasks.    Status On-going               Plan - 06/10/15 1147    Clinical Impression Statement A: Added AROM supine & seated. Increased wall wash to 1'30". Pt reports she wiped down the outside of her car for over an hour yesterday afternoon. Pt has AROM WFL and is going to beginning coming one time per week versus two.    Plan P: Add x to v arms, w arms.         Problem List Patient Active Problem List   Diagnosis Date Noted  . Lumbar compression fracture 12/21/2014  . Morbid obesity 03/07/2013  . Obstructive sleep apnea 03/07/2013  . Fibromyalgia 03/07/2013  . Other and unspecified hyperlipidemia 03/07/2013  . Hx of gastritis 03/07/2013  . S/P cholecystectomy 03/07/2013  . Chest pain 01/07/2013    Guadelupe Sabin, OTR/L  [phone]  06/10/2015, 11:50 AM  [place], [place], [address] Phone: [phone]   Fax:  [phone]

## 2015-06-15 ENCOUNTER — Encounter (HOSPITAL_COMMUNITY): Payer: Commercial Managed Care - HMO

## 2015-06-15 DIAGNOSIS — M25611 Stiffness of right shoulder, not elsewhere classified: Secondary | ICD-10-CM | POA: Diagnosis not present

## 2015-06-17 ENCOUNTER — Ambulatory Visit (HOSPITAL_COMMUNITY): Payer: Commercial Managed Care - HMO

## 2015-06-17 ENCOUNTER — Encounter (HOSPITAL_COMMUNITY): Payer: Self-pay

## 2015-06-17 DIAGNOSIS — M629 Disorder of muscle, unspecified: Secondary | ICD-10-CM

## 2015-06-17 DIAGNOSIS — M25611 Stiffness of right shoulder, not elsewhere classified: Secondary | ICD-10-CM | POA: Diagnosis not present

## 2015-06-17 DIAGNOSIS — R29898 Other symptoms and signs involving the musculoskeletal system: Secondary | ICD-10-CM

## 2015-06-17 DIAGNOSIS — M25511 Pain in right shoulder: Secondary | ICD-10-CM

## 2015-06-17 DIAGNOSIS — M6289 Other specified disorders of muscle: Secondary | ICD-10-CM

## 2015-06-17 NOTE — Patient Instructions (Signed)
(  Home) Extension: Isometric / Bilateral Arm Retraction - Sitting   Facing anchor, hold hands and elbow at shoulder height, with elbow bent.  Pull arms back to squeeze shoulder blades together. Repeat 10-15 times.  Copyright  VHI. All rights reserved.   (Home) Retraction: Row - Bilateral (Anchor)   Facing anchor, arms reaching forward, pull hands toward stomach, keeping elbows bent and at your sides and pinching shoulder blades together. Repeat 10-15 times.  Copyright  VHI. All rights reserved.   (Clinic) Extension / Flexion (Assist)   Face anchor, pull arms back, keeping elbow straight, and squeze shoulder blades together. Repeat 10-15 times.   Copyright  VHI. All rights reserved.   1) Shoulder Protraction    Begin with elbows by your side, slowly "punch" straight out in front of you. Repeat _15__times, ____set/day.     2) Shoulder Flexion  Supine:     Standing:         Begin with arms at your side with thumbs pointed up, slowly raise both arms up and forward towards overhead. Repeat15___times, ___set/day.    3) Horizontal abduction/adduction  Supine:   Standing:           Begin with arms straight out in front of you, bring out to the side in at "T" shape. Keep arms straight entire time. Repeat __15__times, ____sets/day.    4) Internal & External Rotation    *No band* -Stand with elbows at the side and elbows bent 90 degrees. Move your forearms away from your body, then bring back inward toward the body.  Repeat _15__times, ___sets/day    5) Shoulder Abduction  Supine:     Standing:       Lying on your back begin with your arms flat on the table next to your side. Slowly move your arms out to the side so that they go overhead, in a jumping jack or snow angel movement. Repeat _15__times, ___sets/day

## 2015-06-17 NOTE — Therapy (Signed)
[facility] [place], [place], [address] Phone: [phone]   Fax:  [phone]  Occupational Therapy Treatment  Patient Details  Name: [name] MRN: [id number] Date of Birth: January 01, 1946 Referring Provider:  Prince Solian, MD  Encounter Date: 06/17/2015      OT End of Session - 06/17/15 1157    Visit Number 5   Number of Visits 24   Date for OT Re-Evaluation 07/27/15  mini reassess on 7/1   Authorization Type [employer]   Authorization Time Period before 10th visit and send progress update to MD   Authorization - Visit Number 5   Authorization - Number of Visits 10   OT Start Time 1110   OT Stop Time 1153   OT Time Calculation (min) 43 min   Activity Tolerance Patient tolerated treatment well   Behavior During Therapy [facility] for tasks assessed/performed      Past Medical History  Diagnosis Date  . H/O hiatal hernia   . Chronic back pain   . Thyroid disease     takes Synthroid daily  . Allergy     SEASONAL-flonase daily as well as Claritin  . Arthritis     BACK/KNEE  . Depression     takes Wellbutrin daily  . Anxiety     takes Celexa every evening  . Anemia     takes Ferrous Sulfate daily  . Muscle spasm     takes Robaxin daily as needed  . Nausea     takes Zofran daily as needed  . Hyperlipidemia     takes Pravastatin daily as well as Questran  . GERD (gastroesophageal reflux disease)     takes Omeprazole daily and Zantac at bedtime  . Insomnia     takes trazodone nightly  . Hypertension     takes Diovan daily  . Fibromyalgia   . Joint pain   . Chronic back pain     L1 FRACTURE  . History of colon polyps   . Cataract     IMMATURE AND ON LEFT EYE SHE THINKS  . Numbness and tingling     LEGS  . Nocturia   . History of shingles   . Sleep apnea     CPAP-SLEEP STUDY DONE > 5 YRS AGO  . Diabetes mellitus without complication   . Osteoporosis     takes fosamax    Past Surgical  History  Procedure Laterality Date  . Abdominal hysterectomy    . Cholecystectomy    . Tonsillectomy    . Rectocele repair    . 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 Colonoscopy    . Upper gastrointestinal endoscopy  02/2013  . Carpal tunnel release      right and lt  . Fracture surgery      rt hand  . Fracture surgery      lr wrist  . Orif finger fracture  04/11/2012    Procedure: OPEN REDUCTION INTERNAL FIXATION (ORIF) METACARPAL (FINGER) FRACTURE;  Surgeon: Ninetta Lights, MD;  Location: [facility];  Service: Orthopedics;  Laterality: Left;  orif left hand 4th metacarpal   . Hand surgery Right 06/1985  . Hand surgery Left   . Wrist surgery Left   . Knee arthroscopy Left   . Foot surgery Bilateral   . Cardiac catheterization    . Colonoscopy    . Kyphoplasty N/A 12/21/2014    Procedure: KYPHOPLASTY LUMBAR  ONE;  Surgeon: Newman Pies, MD;  Location: [facility];  Service: Neurosurgery;  Laterality: N/A;  L1 Kyphoplasty  . Shoulder arthroscopy with distal clavicle resection Right 05/20/2015    Procedure: SHOULDER ARTHROSCOPY WITH DISTAL CLAVICLE RESECTION DEBRIDEMENT PARTIAL ROTATOR CUFF LABRAL TEAR REMOVAL LOOSE BODIES;  Surgeon: Kathryne Hitch, MD;  Location: [facility];  Service: Orthopedics;  Laterality: Right;    There were no vitals filed for this visit.  Visit Diagnosis:  Tight fascia  Weakness of shoulder  Stiffness of joint, shoulder region, right  Pain in joint, shoulder region, right      Subjective Assessment - 06/17/15 1154    Subjective  S: I worked my arm a lot today with making fudge.   Limitations Protocol: Day 1-14 (Until 06/03/15) Pendulums, AAROM, pulleys, capsular stretches, isometrics. Progress to AROM  once nearly perfect ROM, minimal pain, good strength. UBE bikes, rythmic stabilization. Progress to strengthening phrase when no pain during ROM. Strengthening phrase: Week 6-12 (07/01/15-08/12/15)    Currently in Pain? Yes   Pain Score 3    Pain Location Shoulder   Pain Orientation Right   Pain Descriptors / Indicators Aching   Pain Type Acute pain            [facility] OT Assessment - 06/17/15 1154    Assessment   Diagnosis Right shoulder arthroscopic decompression   Precautions   Precautions Shoulder                  OT Treatments/Exercises (OP) - 06/17/15 1154    Exercises   Exercises Shoulder   Shoulder Exercises: Supine   Protraction PROM;5 reps;AROM;15 reps   Horizontal ABduction PROM;5 reps;AROM;15 reps   External Rotation PROM;5 reps;AROM;15 reps   Internal Rotation PROM;5 reps;AROM;15 reps   Flexion PROM;5 reps;AROM;15 reps   ABduction PROM;5 reps;AROM;15 reps   Shoulder Exercises: Standing   Protraction AROM;15 reps   Horizontal ABduction AROM;15 reps   External Rotation AROM;15 reps   Internal Rotation AROM;15 reps   Flexion AROM;15 reps   ABduction AROM;15 reps   Extension Theraband;15 reps   Theraband Level (Shoulder Extension) Level 3 (Green)   Row [employer] reps   Theraband Level (Shoulder Row) Level 3 (Green)   Retraction Theraband;15 reps   Theraband Level (Shoulder Retraction) Level 3 (Green)   Shoulder Exercises: ROM/Strengthening   Over Head Lace 1'30"   "W" Arms 15X   X to V Arms 15X   Proximal Shoulder Strengthening, Supine 15X   Proximal Shoulder Strengthening, Seated 15X   Manual Therapy   Manual Therapy Myofascial release   Myofascial Release myofascial release to right bicep, upper arm, trapezius, and scapularis regions to decrease pain and increase joint range of motion.                 OT Education - 06/17/15 1157    Education provided Yes   Education Details AROM and green theraband scapular exercises   Person(s) Educated Patient   Methods Explanation;Demonstration;Handout   Comprehension Returned demonstration;Verbalized understanding          OT Short Term Goals - 05/31/15 1242    OT SHORT TERM GOAL  #1   Title Pt will be educated on HEP.    Status On-going   OT SHORT TERM GOAL #2   Title Patient will decrease pain to 5/10 during daily tasks.    Status On-going   OT SHORT TERM GOAL #3   Title Pt will decrease fascial restrictions from max  to mod amount.    Status On-going   OT SHORT TERM GOAL #4   Title Patient will increase PROM of RUE to [facility] to increase ability to donn/doff shirts with less difficulty.    Status On-going   OT SHORT TERM GOAL #5   Title Pt will increase strength to 3/5 to increase ability to move and work with craft items.    Status On-going           OT Long Term Goals - 05/31/15 1242    OT LONG TERM GOAL #1   Title Patient will return to highest level of independence will all daily and leisure tasks.    Status On-going   OT LONG TERM GOAL #2   Title Patient will decrease fascial restrctions to min amount.    Status On-going   OT LONG TERM GOAL #3   Title Patient will increase RUE strength to 4+/5 to increase ability to lift craft items.   Status On-going   OT LONG TERM GOAL #4   Title Patient will increase AROM to [facility] to increase ability to reach into overhead cabinets.    Status On-going   OT LONG TERM GOAL #5   Title Patient will decrease pain to 3/10 or less during daily tasks.    Status On-going               Plan - 06/17/15 1157    Clinical Impression Statement A: Added W arms, X to V arms, and increased reps with AROM. Patient given an updated HEP.    Plan P: Add UBE bike and rythmic stabilization exercises supine and standing.        Problem List Patient Active Problem List   Diagnosis Date Noted  . Lumbar compression fracture 12/21/2014  . Morbid obesity 03/07/2013  . Obstructive sleep apnea 03/07/2013  . Fibromyalgia 03/07/2013  . Other and unspecified hyperlipidemia 03/07/2013  . Hx of gastritis 03/07/2013  . S/P cholecystectomy 03/07/2013  . Chest pain 01/07/2013    Ailene Ravel, OTR/L,CBIS  [phone]   06/17/2015, 11:59 AM  [place], [place], [address] Phone: [phone]   Fax:  [phone]

## 2015-06-22 ENCOUNTER — Encounter (HOSPITAL_COMMUNITY): Payer: Commercial Managed Care - HMO

## 2015-06-24 ENCOUNTER — Encounter (HOSPITAL_COMMUNITY): Payer: Commercial Managed Care - HMO

## 2015-06-29 ENCOUNTER — Encounter (HOSPITAL_COMMUNITY): Payer: Commercial Managed Care - HMO

## 2015-07-01 ENCOUNTER — Ambulatory Visit (HOSPITAL_COMMUNITY): Payer: Commercial Managed Care - HMO | Attending: Orthopedic Surgery

## 2015-07-01 ENCOUNTER — Encounter (HOSPITAL_COMMUNITY): Payer: Self-pay

## 2015-07-01 DIAGNOSIS — R29898 Other symptoms and signs involving the musculoskeletal system: Secondary | ICD-10-CM | POA: Diagnosis present

## 2015-07-01 NOTE — Patient Instructions (Signed)
Internal & External Rotation    -Stand with elbows at the side and elbows bent 90 degrees. Move your forearms away from your body, then bring back inward toward the body.  Repeat _15__times, _2__sets/day.  External Rotation: Sitting (Dumbbell)   Elbows steady, rotate forearms out. Do __1__ sets. Complete __15__ repetitions.  Copyright  VHI. All rights reserved.   Complete each one 15 times.  EXTERNAL ROTATION - 90 90 - FREE WEIGHTS  Begin by holding free weights with your arm up at 90 degrees away from your side and elbow bent to 90 degrees. Your forearm should be directed forward in the beginning position as shown.   Next, roll your shoulder back so that your forearm is directed upward. Then, return to original position.   Maintain your shoulder blade in a retracted and downward position the entire time.     53 90 ABDUCTION WITH FREE WEIGHT  Start by holding weights at your side with elbows bent to 90 degrees. Then raise up your elbows away from your side while maintaining your elbows bent at 90 degrees the entire time.

## 2015-07-01 NOTE — Therapy (Signed)
[facility] [address] [place], [place], [address] Phone: [phone]   Fax:  [phone]  Occupational Therapy Treatment  Patient Details  Name: [name] MRN: [id number] Date of Birth: 02/03/1946 Referring Provider:  Murphy, Daniel, MD  Encounter Date: 07/01/2015      OT End of Session - 07/01/15 1216    Visit Number 6   Number of Visits 24   Authorization Type [employer]   Authorization Time Period before 10th visit and send progress update to MD   Authorization - Visit Number 6   Authorization - Number of Visits 10   OT Start Time 1107   OT Stop Time 1145   OT Time Calculation (min) 38 min   Activity Tolerance Patient tolerated treatment well   Behavior During Therapy WFL for tasks assessed/performed      Past Medical History  Diagnosis Date  . H/O hiatal hernia   . Chronic back pain   . Thyroid disease     takes Synthroid daily  . Allergy     SEASONAL-flonase daily as well as Claritin  . Arthritis     BACK/KNEE  . Depression     takes Wellbutrin daily  . Anxiety     takes Celexa every evening  . Anemia     takes Ferrous Sulfate daily  . Muscle spasm     takes Robaxin daily as needed  . Nausea     takes Zofran daily as needed  . Hyperlipidemia     takes Pravastatin daily as well as Questran  . GERD (gastroesophageal reflux disease)     takes Omeprazole daily and Zantac at bedtime  . Insomnia     takes trazodone nightly  . Hypertension     takes Diovan daily  . Fibromyalgia   . Joint pain   . Chronic back pain     L1 FRACTURE  . History of colon polyps   . Cataract     IMMATURE AND ON LEFT EYE SHE THINKS  . Numbness and tingling     LEGS  . Nocturia   . History of shingles   . Sleep apnea     CPAP-SLEEP STUDY DONE > 5 YRS AGO  . Diabetes mellitus without complication   . Osteoporosis     takes fosamax    Past Surgical History  Procedure Laterality Date  . Abdominal hysterectomy    .  Cholecystectomy    . Tonsillectomy    . Rectocele repair    . 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 Colonoscopy    . Upper gastrointestinal endoscopy  02/2013  . Carpal tunnel release      right and lt  . Fracture surgery      rt hand  . Fracture surgery      lr wrist  . Orif finger fracture  04/11/2012    Procedure: OPEN REDUCTION INTERNAL FIXATION (ORIF) METACARPAL (FINGER) FRACTURE;  Surgeon: Daniel F Murphy, MD;  Location: [facility];  Service: Orthopedics;  Laterality: Left;  orif left hand 4th metacarpal   . Hand surgery Right 06/1985  . Hand surgery Left   . Wrist surgery Left   . Knee arthroscopy Left   . Foot surgery Bilateral   . Cardiac catheterization    . Colonoscopy    . Kyphoplasty N/A 12/21/2014    Procedure: KYPHOPLASTY LUMBAR ONE;  Surgeon: Jeffrey Jenkins, MD;  Location: [facility];    Service: Neurosurgery;  Laterality: N/A;  L1 Kyphoplasty  . Shoulder arthroscopy with distal clavicle resection Right 05/20/2015    Procedure: SHOULDER ARTHROSCOPY WITH DISTAL CLAVICLE RESECTION DEBRIDEMENT PARTIAL ROTATOR CUFF LABRAL TEAR REMOVAL LOOSE BODIES;  Surgeon: Kathryne Hitch, MD;  Location: [facility];  Service: Orthopedics;  Laterality: Right;    There were no vitals filed for this visit.  Visit Diagnosis:  Weakness of shoulder      Subjective Assessment - 07/01/15 1212    Subjective  S: I went to Dr. Percell Miller and he was so surprised how well I'm doing.    Currently in Pain? No/denies            [facility] OT Assessment - 07/01/15 1111    Assessment   Diagnosis Right shoulder arthroscopic decompression   Precautions   Precautions Shoulder   AROM   Overall AROM Comments Assessed in standing, ER/IR adducted   AROM Assessment Site Shoulder   Right/Left Shoulder Right   Right Shoulder Flexion 140 Degrees  on eval: 105   Right Shoulder ABduction 135 Degrees  on eval; 0   Right Shoulder Internal Rotation 90 Degrees  on  eval: 80   Right Shoulder External Rotation 70 Degrees  on eval: 40   Strength   Overall Strength Comments Strength assessed standing. IR/ER adducted. Strength not assessed at eval.    Strength Assessment Site Shoulder   Right/Left Shoulder Right   Right Shoulder Flexion 5/5   Right Shoulder ABduction 5/5   Right Shoulder Internal Rotation 4+/5   Right Shoulder External Rotation 5/5                  OT Treatments/Exercises (OP) - 07/01/15 1215    Exercises   Exercises Shoulder   Shoulder Exercises: Standing   External Rotation Strengthening;Theraband;15 reps   Theraband Level (Shoulder External Rotation) Level 2 (Red)   Internal Rotation Strengthening;Theraband;10 reps   Theraband Level (Shoulder Internal Rotation) Level 2 (Red)                OT Education - 07/01/15 1213    Education provided Yes   Education Details IR/ER strengthening using hand weight and theraband.   Person(s) Educated Patient   Methods Demonstration;Explanation;Handout   Comprehension Returned demonstration;Verbalized understanding          OT Short Term Goals - 07/01/15 1135    OT SHORT TERM GOAL #1   Title Pt will be educated on HEP.    Status Achieved   OT SHORT TERM GOAL #2   Title Patient will decrease pain to 5/10 during daily tasks.    Status Achieved   OT SHORT TERM GOAL #3   Title Pt will decrease fascial restrictions from max to mod amount.    Status Achieved   OT SHORT TERM GOAL #4   Title Patient will increase PROM of RUE to [facility] to increase ability to donn/doff shirts with less difficulty.    Status Achieved   OT SHORT TERM GOAL #5   Title Pt will increase strength to 3/5 to increase ability to move and work with craft items.    Status Achieved           OT Long Term Goals - 07/01/15 1136    OT LONG TERM GOAL #1   Title Patient will return to highest level of independence will all daily and leisure tasks.    Status Achieved   OT LONG TERM GOAL #2    Title Patient  will decrease fascial restrctions to min amount.    Status Achieved   OT LONG TERM GOAL #3   Title Patient will increase RUE strength to 4+/5 to increase ability to lift craft items.   Status Achieved   OT LONG TERM GOAL #4   Title Patient will increase AROM to WFL to increase ability to reach into overhead cabinets.    Status Achieved   OT LONG TERM GOAL #5   Title Patient will decrease pain to 3/10 or less during daily tasks.    Status Achieved               Plan - 07/01/15 1216    Clinical Impression Statement A: Pt has met all therapy goals and is back to doing all of her normal daily tasks and leisure activities. Pt has been given an updated HEP and will complete independently    Plan P: D/C with HEP          G-Codes - 07/01/15 1221    Functional Assessment Tool Used FOTO score: 88/100 (12% impaired)   Functional Limitation Carrying, moving and handling objects   Carrying, Moving and Handling Objects Goal Status (G8985) At least 1 percent but less than 20 percent impaired, limited or restricted   Carrying, Moving and Handling Objects Discharge Status (G8986) At least 1 percent but less than 20 percent impaired, limited or restricted      Problem List Patient Active Problem List   Diagnosis Date Noted  . Lumbar compression fracture 12/21/2014  . Morbid obesity 03/07/2013  . Obstructive sleep apnea 03/07/2013  . Fibromyalgia 03/07/2013  . Other and unspecified hyperlipidemia 03/07/2013  . Hx of gastritis 03/07/2013  . S/P cholecystectomy 03/07/2013  . Chest pain 01/07/2013  OCCUPATIONAL THERAPY DISCHARGE SUMMARY  Visits from Start of Care: 6  Current functional level related to goals / functional outcomes: See goals above   Remaining deficits: Pt is showing slight weakness with internal rotation.    Education / Equipment: LUE strengthening using theraband and strengthening. Plan: Patient agrees to discharge.  Patient goals were met.  Patient is being discharged due to meeting the stated rehab goals.  ?????       Laura Essenmacher, OTR/L,CBIS  [phone]  07/01/2015, 12:24 PM  [facility] [address] [place], [place], [address] Phone: [phone]   Fax:  [phone]    

## 2015-07-06 ENCOUNTER — Encounter (HOSPITAL_COMMUNITY): Payer: Commercial Managed Care - HMO

## 2015-07-06 ENCOUNTER — Telehealth (HOSPITAL_COMMUNITY): Payer: Self-pay

## 2015-07-06 NOTE — Telephone Encounter (Signed)
Date: 07/06/15  Left patient message letting her know the wrong Kathryne Hitch was faxed on 07/01/15 regarding her discharge from OT. Called wrong MD to inform them to shred patient's information and refaxed to correct MD. Any questions she is to call the Outpatient office.   Ailene Ravel, OTR/L,CBIS  [phone]

## 2015-07-08 ENCOUNTER — Encounter (HOSPITAL_COMMUNITY): Payer: Commercial Managed Care - HMO

## 2015-07-19 ENCOUNTER — Telehealth: Payer: Self-pay

## 2015-07-19 NOTE — Telephone Encounter (Signed)
Called pt advise pt to bring her cpap to her appt tomorrow. No answer on either number listed.

## 2015-07-20 ENCOUNTER — Institutional Professional Consult (permissible substitution): Payer: Commercial Managed Care - HMO | Admitting: Neurology

## 2015-07-20 ENCOUNTER — Ambulatory Visit (INDEPENDENT_AMBULATORY_CARE_PROVIDER_SITE_OTHER): Payer: Commercial Managed Care - HMO | Admitting: Neurology

## 2015-07-20 ENCOUNTER — Encounter: Payer: Self-pay | Admitting: Neurology

## 2015-07-20 DIAGNOSIS — G4733 Obstructive sleep apnea (adult) (pediatric): Secondary | ICD-10-CM

## 2015-07-20 DIAGNOSIS — G4730 Sleep apnea, unspecified: Secondary | ICD-10-CM

## 2015-07-20 DIAGNOSIS — G4710 Hypersomnia, unspecified: Secondary | ICD-10-CM

## 2015-07-20 NOTE — Progress Notes (Signed)
SLEEP MED[facility]INE CLINIC   Provider:  Larey Seat, M D  Referring Provider: Prince Solian, MD Primary Care Physician:  Tivis Ringer, MD  Chief Complaint  Patient presents with  . sleep consult    on cpap, did not bring cpap, cannot find records on pt in centricity, rm 11, alone    HPI:  [name] is a 69 y.o. female seen here as a referral from Dr. Dagmar Hait for obstructive sleep apnea re-evaluation.  I am not quite sure when I saw [name] last but it might be between 8 and 10 years ago. This was definitely before we had any electronic medical records. [name] has a history of a pituitary adenoma diagnosed by an MRI of the brain in September 2007 she is followed by Dr. Michiel Sites for endocrine issues she had had surgery and [place]. She also has hypothyroidism she is followed by orthopedist for degenerative disc disease and spine disease, she had carpal tunnel surgery in 1998 arthroplasty in December 2015 was Dr. Arnoldo Morale neurosurgeon, and she has a history of iron deficiency anemia. Her obstructive sleep apnea has been treated with CPAP and the machine is at least as old as our last visit. She states that she now has air leaks, she was already on a CPAP machine before 2000 sevenths testing. The machine now is no longer reliably working for her and she needs a reevaluation to qualify for new machine. She also needs new supplies and probably can be fitted to a different interface. At that her machine always stays at 4 this would be an almost atmospheric pressure. It may just be the beginning pressure of her ramp. Her machine currently has no date chip and so she felt it was not helpful. 2 provided. I think that she is definitely in need of a new sleep study. Her comorbidities were listed above.  [name] also endorsed that she has even when she wears her current CPAP again sleep apnea breaking through and that she has been witnessed to snore very loudly. She is also still very  heavy her main risk factor for obstructive sleep apnea and she has a crowded dental status.  She usually goes to bed around 10.30 Pm and goes to sleep, usually promptly with the help of a sleep aid ( Trazodone), will only have one nocturia bathroom break at night, but she frequently wakes up for other reasons. The leaking of air from the interface is one of the reasons that she wakes up. He states that discomfort or joint pain is not among the reasons for her to wake up.  She wakes up with a very dry mouth but denies morning headaches. Between 5 and 6 hours of sleep are her nocturnal sleep time she rises at usually between 6 and 7. Sometimes her husband's alarm clock will wake her to. He is retired and does not work outside the home. She'll drink 1 cup of coffee in the morning. She does not drink any other coffee and ate it beverages throughout the day and only takes water. She has started this also to lose weight. She states she feels usually hot and not cold at night.  She doesn't smoke and doesn't drink alcohol.  Her bedroom is described as cool, quiet and dark and she shares up with her husband.  Her husband has witnessed apneas and snoring but no sleep behaviors such as night terrors or sleepwalking sleep talking were reported.  She rarely naps in daytime, is very  sleepy and has an irresistible urge to move. On Sundays after church, she will nap.     Review of Systems: Out of a complete 14 system review, the patient complains of only the following symptoms, and all other reviewed systems are negative. Snoring, obesity, apnea, depression.   Epworth score 13, Fatigue severity score 30 , depression score 3. Treated for depression since 2000. Lost 6 family members in 2006, and 07.    History   Social History  . Marital Status: Married    Spouse Name: [name]  . Number of Children: 5  . Years of Education: N/A   Occupational History  . retired    Social History Main Topics  . Smoking  status: Never Smoker   . Smokeless tobacco: Never Used  . Alcohol Use: No  . Drug Use: No  . Sexual Activity: Yes    Birth Control/ Protection: Surgical   Other Topics Concern  . Not on file   Social History Narrative   Drinks 1 cup of coffee daily. 1 diet pepsi.    Family History  Problem Relation Age of Onset  . Hypertension Mother   . Diabetes Mother   . Cancer Maternal Grandfather     back  . Cancer Paternal Uncle   . Lung cancer Paternal Aunt   . Lung cancer Sister   . Heart attack Brother     x 2    Past Medical History  Diagnosis Date  . H/O hiatal hernia   . Chronic back pain   . Thyroid disease     takes Synthroid daily  . Allergy     SEASONAL-flonase daily as well as Claritin  . Arthritis     BACK/KNEE  . Depression     takes Wellbutrin daily  . Anxiety     takes Celexa every evening  . Anemia     takes Ferrous Sulfate daily  . Muscle spasm     takes Robaxin daily as needed  . Nausea     takes Zofran daily as needed  . Hyperlipidemia     takes Pravastatin daily as well as Questran  . GERD (gastroesophageal reflux disease)     takes Omeprazole daily and Zantac at bedtime  . Insomnia     takes trazodone nightly  . Hypertension     takes Diovan daily  . Fibromyalgia   . Joint pain   . Chronic back pain     L1 FRACTURE  . History of colon polyps   . Cataract     IMMATURE AND ON LEFT EYE SHE THINKS  . Numbness and tingling     LEGS  . Nocturia   . History of shingles   . Sleep apnea     CPAP-SLEEP STUDY DONE > 5 YRS AGO  . Diabetes mellitus without complication   . Osteoporosis     takes fosamax    Past Surgical History  Procedure Laterality Date  . Abdominal hysterectomy    . Cholecystectomy    . Tonsillectomy    . Rectocele repair    . 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 Colonoscopy    . Upper gastrointestinal endoscopy  02/2013  . Carpal tunnel release      right and lt  . Fracture surgery      rt hand    . Fracture surgery      lr wrist  . Orif finger fracture  04/11/2012    Procedure: OPEN  REDUCTION INTERNAL FIXATION (ORIF) METACARPAL (FINGER) FRACTURE;  Surgeon: Ninetta Lights, MD;  Location: [facility];  Service: Orthopedics;  Laterality: Left;  orif left hand 4th metacarpal   . Hand surgery Right 06/1985  . Hand surgery Left   . Wrist surgery Left   . Knee arthroscopy Left   . Foot surgery Bilateral   . Cardiac catheterization    . Colonoscopy    . Kyphoplasty N/A 12/21/2014    Procedure: KYPHOPLASTY LUMBAR ONE;  Surgeon: Newman Pies, MD;  Location: [facility];  Service: Neurosurgery;  Laterality: N/A;  L1 Kyphoplasty  . Shoulder arthroscopy with distal clavicle resection Right 05/20/2015    Procedure: SHOULDER ARTHROSCOPY WITH DISTAL CLAVICLE RESECTION DEBRIDEMENT PARTIAL ROTATOR CUFF LABRAL TEAR REMOVAL LOOSE BODIES;  Surgeon: Kathryne Hitch, MD;  Location: [facility];  Service: Orthopedics;  Laterality: Right;    Current Outpatient Prescriptions  Medication Sig Dispense Refill  . alendronate (FOSAMAX) 70 MG tablet Take 70 mg by mouth once a week. Take with a full glass of water on an empty stomach.    Marland Kitchen aspirin 81 MG tablet Take 81 mg by mouth daily.    . Biotin 1000 MCG tablet Take 1,000 mcg by mouth 3 (three) times daily.    Marland Kitchen buPROPion (WELLBUTRIN SR) 150 MG 12 hr tablet Take 150 mg by mouth daily.    . cholestyramine (QUESTRAN) 4 G packet 1 packet in juice or water once daily. Take 2 hours away from other medications. 30 each 1  . citalopram (CELEXA) 40 MG tablet Take 40 mg by mouth every evening.     . Ferrous Sulfate (IRON) 325 (65 FE) MG TABS Take 1 tablet by mouth daily.    . fluticasone (FLONASE) 50 MCG/ACT nasal spray Place 2 sprays into the nose daily.    Marland Kitchen HYDROcodone-acetaminophen (NORCO) 10-325 MG per tablet Take 1 tablet by mouth every 6 (six) hours as needed.    Marland Kitchen levothyroxine (SYNTHROID, LEVOTHROID) 100 MCG tablet Take 100 mcg  by mouth daily before breakfast.    . loratadine (CLARITIN) 10 MG tablet Take 10 mg by mouth daily.    . metFORMIN (GLUCOPHAGE) 500 MG tablet Take 500 mg by mouth daily with breakfast.     . omega-3 acid ethyl esters (LOVAZA) 1 G capsule Take 1 g by mouth daily.    Marland Kitchen omeprazole (PRILOSEC) 40 MG capsule Take 1 capsule (40 mg total) by mouth daily before breakfast. 30 capsule 11  . ondansetron (ZOFRAN) 4 MG tablet Take 1 tablet (4 mg total) by mouth every 8 (eight) hours as needed for nausea or vomiting. 40 tablet 0  . OVER THE COUNTER MEDICATION Take 600 mg by mouth daily. Calcium + D OTC 600mg  one a day    . oxyCODONE-acetaminophen (ROXICET) 5-325 MG per tablet Take 1-2 tablets by mouth every 4 (four) hours as needed. 60 tablet 0  . potassium chloride SA (K-DUR,KLOR-CON) 20 MEQ tablet Take 20 mEq by mouth 2 (two) times daily.    . pravastatin (PRAVACHOL) 20 MG tablet Take 20 mg by mouth at bedtime.     . traZODone (DESYREL) 50 MG tablet Take 50 mg by mouth at bedtime.    . valsartan-hydrochlorothiazide (DIOVAN-HCT) 80-12.5 MG per tablet Take 1 tablet by mouth daily.     No current facility-administered medications for this visit.    Allergies as of 07/20/2015 - Review Complete 07/20/2015  Allergen Reaction Noted  . Penicillins Itching 04/06/2012  . Sulfa  antibiotics Swelling 04/06/2012    Vitals: BP 138/80 mmHg  Pulse 80  Resp 20  Ht 4' 11.5" (1.511 m)  Wt 225 lb (102.059 kg)  BMI 44.70 kg/m2 Last Weight:  Wt Readings from Last 1 Encounters:  07/20/15 225 lb (102.059 kg)       Last Height:   Ht Readings from Last 1 Encounters:  07/20/15 4' 11.5" (1.511 m)    Physical exam:  General: The patient is awake, alert and appears not in acute distress. The patient is well groomed. Head: Normocephalic, atraumatic. Neck is supple. Mallampati 1 , uvula lifts midline.  neck circumference: 16 Nasal airflow restricited  TMJ is  Not  evident . Retrognathia is not *seen.  Cardiovascular:   Regular rate and rhythm , without  murmurs or carotid bruit, and without distended neck veins. Respiratory: Lungs are clear to auscultation. Skin:  Without evidence of edema, or rash Trunk: BMI is elevated and patient  has normal posture.  Neurologic exam : The patient is awake and alert, oriented to place and time.   Memory subjective described as intact.  There is a normal attention span & concentration ability. Speech is fluent without dysarthria, dysphonia or aphasia. Mood and affect are appropriate.  Cranial nerves: Pupils are equal and briskly reactive to light. 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  Funduscopic exam impaired . Extraocular movements  in vertical and horizontal planes intact and without nystagmus. Visual fields by finger perimetry are intact. Hearing to finger rub intact.  Facial sensation intact to fine touch.  Facial motor strength is symmetric and tongue and uvula move midline.  Motor exam: Normal tone, muscle bulk and symmetric strength in all extremities. Mrs. Rady had shoulder surgery on the right side she is now able to sleep on her right side again. She has good grip strength and range of motion in the shoulder. Her shoulder shrug is symmetric.  Sensory:  Fine touch, pinprick and vibration were tested in all extremities. Proprioception is normal.  Coordination: Rapid alternating movements in the fingers/hands is normal. Finger-to-nose maneuver  normal without evidence of ataxia, dysmetria or tremor.  Gait and station: Patient walks without assistive device and is able unassisted to climb up to the exam table. Strength within normal limits. Stance is stable and normal. Tandem gait is affected by obesity . Deep tendon reflexes: in the  upper and lower extremities are symmetric and intact. Babinski maneuver response is  downgoing.   Assessment:  After physical and neurologic examination, review of laboratory studies, imaging,  neurophysiology testing and pre-existing records, assessment is  Mrs. Schaad is using a CPAP for at least 7 or 8 years that seems no longer to work. Unfortunately I cannot check the machine here but her durable medical equipment company can certainly do this she is followed by advanced home care. He has not New supplies and she has not had compliance visits. I will have to refer her for a new sleep study to qualify her for a new machine and to establish what her baseline is. In addition she has gained weight since we last saw her and her pressure needs may have significantly changed. Her muscle tone upper airway were intact and she has no obvious neurologic deficits. He will undergo a sleep study without CO2 retention measures to be split at an AHI of 20 with a desaturation level of 4%. I will order  an attended split -polysomnography. The patient did not bring her CPAP  machine to this appointment.    The patient was advised of the nature of the diagnosed sleep disorder , the treatment options and risks for general a health and wellness arising from not treating the condition. Visit duration was 30 minutes.   Plan:  Treatment plan and additional workup :  SPLIT , patie thas currently a nasal mask of unknown type, she may do better with a pillow.  BMI is elevated, she is already changing her diet with Dr Danna Hefty help. She is not yet following a carbohydrate reduction plan.    Asencion Partridge Albertus Chiarelli MD  07/20/2015

## 2015-07-20 NOTE — Patient Instructions (Signed)

## 2015-08-10 ENCOUNTER — Ambulatory Visit (INDEPENDENT_AMBULATORY_CARE_PROVIDER_SITE_OTHER): Payer: Commercial Managed Care - HMO | Admitting: Neurology

## 2015-08-10 DIAGNOSIS — G4730 Sleep apnea, unspecified: Secondary | ICD-10-CM

## 2015-08-10 DIAGNOSIS — G4733 Obstructive sleep apnea (adult) (pediatric): Secondary | ICD-10-CM | POA: Diagnosis not present

## 2015-08-10 DIAGNOSIS — G4710 Hypersomnia, unspecified: Secondary | ICD-10-CM

## 2015-08-10 NOTE — Sleep Study (Signed)
Please see the scanned sleep study interpretation located in the Procedure tab within the Chart Review section. 

## 2015-08-16 ENCOUNTER — Telehealth: Payer: Self-pay

## 2015-08-16 NOTE — Telephone Encounter (Signed)
Called pt with sleep study results. 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 Pap therapy is one option, but an ENT evalution/procedure or oral appliance is other options. 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 Pt wishes to make a f/u appt with Dr. Brett Fairy to discuss further what to do regarding cpap. Pt states she would like to not use cpap any longer. Appt made for 9/15 at 9:00. Pt verbalized understanding to bring cpap and arrive 15 minutes early.

## 2015-09-09 ENCOUNTER — Ambulatory Visit: Payer: Self-pay | Admitting: Neurology

## 2015-09-09 ENCOUNTER — Telehealth: Payer: Self-pay

## 2015-09-09 NOTE — Telephone Encounter (Signed)
Dr. Brett Fairy out of office today. Called pt to r/s appt. Pt agreeable to r/s appt to 10/04/15 at 1:00.

## 2015-09-09 NOTE — Telephone Encounter (Signed)
Pt called stating she got a bill from her insurance after her visit with Dr. Brett Fairy. Her insurance won't pay for the visit because the referral from her PCP said to see Dr. Rexene Alberts but she was seen by Dr. Brett Fairy. Pt called her PCP to get the referral changed, but was told by that office that our referral office needed to call theirs to get the referral changed. This is so pt will not have to pay this bill. I advised pt that I would send this request to our referrals dept. Pt verbalized understanding.

## 2015-10-04 ENCOUNTER — Ambulatory Visit (INDEPENDENT_AMBULATORY_CARE_PROVIDER_SITE_OTHER): Payer: Commercial Managed Care - HMO | Admitting: Neurology

## 2015-10-04 ENCOUNTER — Encounter: Payer: Self-pay | Admitting: Neurology

## 2015-10-04 VITALS — BP 136/78 | HR 76 | Resp 20 | Ht 59.5 in | Wt 224.0 lb

## 2015-10-04 DIAGNOSIS — G4731 Primary central sleep apnea: Secondary | ICD-10-CM

## 2015-10-04 NOTE — Progress Notes (Signed)
SLEEP MED[facility]INE CLINIC   Provider:  Larey Seat, M D  Referring Provider: Prince Solian, MD Primary Care Physician:  Tivis Ringer, MD  Chief Complaint  Patient presents with  . Follow-up    sleep study results, rm 10, alone    HPI:  [name] is a 69 y.o. female seen here as a referral from Dr. Dagmar Hait for obstructive sleep apnea re-evaluation.  I am not quite sure when I saw [name] last-  between 8 and 10 years ago. This was definitely before we had any electronic medical records.  [name] has a history of a pituitary adenoma diagnosed by an MRI of the brain in September 2007 and she is followed by Dr. Chalmers Cater for endocrine issues she had had surgery and [place]. She also has hypothyroidism. she is followed by orthopedist for degenerative disc disease and spine disease, she had carpal tunnel surgery in 1998 arthroplasty in December 2015 was Dr. Arnoldo Morale neurosurgeon, and she has a history of iron deficiency anemia. Her obstructive sleep apnea has been treated with CPAP and the machine is at least as old as our last visit. She states that she now has air leaks, she was already on a CPAP machine before 2000 sevenths testing. The machine now is no longer reliably working for her and she needs a reevaluation to qualify for new machine. She also needs new supplies and probably can be fitted to a different interface. At that her machine always stays at 4 this would be an almost atmospheric pressure. It may just be the beginning pressure of her ramp. Her machine currently has no date chip and so she felt it was not helpful. 2 provided. I think that she is definitely in need of a new sleep study. Her comorbidities were listed above.  [name] also endorsed that she has even when she wears her current CPAP again sleep apnea breaking through and that she has been witnessed to snore very loudly. She is also still very heavy her main risk factor for obstructive sleep apnea and  she has a crowded dental status.  She usually goes to bed around 10.30 Pm and goes to sleep, usually promptly with the help of a sleep aid ( Trazodone), will only have one nocturia bathroom break at night, but she frequently wakes up for other reasons. The leaking of air from the interface is one of the reasons that she wakes up. He states that discomfort or joint pain is not among the reasons for her to wake up.  She wakes up with a very dry mouth but denies morning headaches. Between 5 and 6 hours of sleep are her nocturnal sleep time she rises at usually between 6 and 7. Sometimes her husband's alarm clock will wake her to. He is retired and does not work outside the home. She'll drink 1 cup of coffee in the morning. She does not drink any other coffee and ate it beverages throughout the day and only takes water. She has started this also to lose weight. She states she feels usually hot and not cold at night.  She doesn't smoke and doesn't drink alcohol.  Her bedroom is described as cool, quiet and dark and she shares up with her husband.  Her husband has witnessed apneas and snoring but no sleep behaviors such as night terrors or sleepwalking sleep talking were reported.  She rarely naps in daytime, is very sleepy and has an irresistible urge to move. On Sundays after church,  she will nap.  History from 10-04-15, [name] underwent a new baseline polysomnography on 08-10-15, she had endorsed the Epworth Sleepiness Scale at 16 points and the depression pH Q9 form at 18 points, her BMI was 45. Neck circumference 16 inches. She had normal pre-study blood pressures the patient had 6 obstructive apneas, 13 central apneas and 40 hypopneas her AHI was 9.7 and the RDI was 11.8. She had an oxygen nadir of 87% saturation but remained 18.5 minutes desaturated for the night. He had frequent periodic limb movements and her arousal index was 7.2. She brought her CPAP machine to today's visit that the also obtain a  download and compliance data. She is already the owner of a CPAP machine. She states that she recognizes that there is a lot of air leakage which also hinders her to sleep. For this I will ask her to see my sleep Community education officer today. We discussed that the patient could continue to use positive airway therapy,  but she could also pursue weight loss or a dental device given the mild nature of her current apnea. She is already using CPAP I rather prefer if she stays with the machine. Her current machine is very noisy and is over 64 years old, her request is valid to get a new machine. We also addressed compliance and she actually did not benefit much from her CPAP machine as used over the last month the medium pressure is 8.8 cm water the 91st percentile is 11 cm water the machine has a window between 4 and 20 cm water pressure the airleak was 23.4 L a minute which likely keeps her from sleeping well the residual AHI was 9.3 which is much too high she used to machine on 179 days and only on 4 days did she not. She is highly compliant and her average usage per day is 7 hours and 19 minutes.  3 treatment options are 1)aggressive weight loss therapy,  2)continue CPAP but with a newer machine and I would also limit the pressure setting the upper window of pressure should be 12 cm water pressure. 3) she could pursue a dental device given that the apnea was in the mild category.  I would also like to add that she had more centrals than obstructive apneas. Her residual Epworth sleepiness score is still 16 out of 24 points after using the CPAP was very good compliance. Her fatigue severity score is 55 points and her geriatric depression score was endorsed at 6 points. I would offer this patient and new setting on a new CPAP machine with a goal of not exacerbating central apneas. If her Epworth score remains high I would prescribe Provigil or Nuvigil for her to give her energy and daytime reports disc difficulties to fall  asleep and has taken Desyrel but she may have a little hangover effect and the machine has woken her up even when she successfully initiated sleep with a sleep aid.   Review of Systems: Out of a complete 14 system review, the patient complains of only the following symptoms, and all other reviewed systems are negative. Snoring, obesity, apnea, depression.   Epworth score 16 from 13, Fatigue severity score up on CPAP 55 from  30 , depression score 5 from 3.  Treated for depression since 2000. Lost 6 family members in 2006, and 07. Insomnia, daytime sleepiness.    Social History   Social History  . Marital Status: Married    Spouse Name: [name]  .  Number of Children: 5  . Years of Education: N/A   Occupational History  . retired    Social History Main Topics  . Smoking status: Never Smoker   . Smokeless tobacco: Never Used  . Alcohol Use: No  . Drug Use: No  . Sexual Activity: Yes    Birth Control/ Protection: Surgical   Other Topics Concern  . Not on file   Social History Narrative   Drinks 1 cup of coffee daily. 1 diet pepsi.    Family History  Problem Relation Age of Onset  . Hypertension Mother   . Diabetes Mother   . Cancer Maternal Grandfather     back  . Cancer Paternal Uncle   . Lung cancer Paternal Aunt   . Lung cancer Sister   . Heart attack Brother     x 2    Past Medical History  Diagnosis Date  . H/O hiatal hernia   . Chronic back pain   . Thyroid disease     takes Synthroid daily  . Allergy     SEASONAL-flonase daily as well as Claritin  . Arthritis     BACK/KNEE  . Depression     takes Wellbutrin daily  . Anxiety     takes Celexa every evening  . Anemia     takes Ferrous Sulfate daily  . Muscle spasm     takes Robaxin daily as needed  . Nausea     takes Zofran daily as needed  . Hyperlipidemia     takes Pravastatin daily as well as Questran  . GERD (gastroesophageal reflux disease)     takes Omeprazole daily and Zantac at bedtime  .  Insomnia     takes trazodone nightly  . Hypertension     takes Diovan daily  . Fibromyalgia   . Joint pain   . Chronic back pain     L1 FRACTURE  . History of colon polyps   . Cataract     IMMATURE AND ON LEFT EYE SHE THINKS  . Numbness and tingling     LEGS  . Nocturia   . History of shingles   . Sleep apnea     CPAP-SLEEP STUDY DONE > 5 YRS AGO  . Diabetes mellitus without complication ([place])   . Osteoporosis     takes fosamax    Past Surgical History  Procedure Laterality Date  . Abdominal hysterectomy    . Cholecystectomy    . Tonsillectomy    . Rectocele repair    . 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 Colonoscopy    . Upper gastrointestinal endoscopy  02/2013  . Carpal tunnel release      right and lt  . Fracture surgery      rt hand  . Fracture surgery      lr wrist  . Orif finger fracture  04/11/2012    Procedure: OPEN REDUCTION INTERNAL FIXATION (ORIF) METACARPAL (FINGER) FRACTURE;  Surgeon: Ninetta Lights, MD;  Location: [facility];  Service: Orthopedics;  Laterality: Left;  orif left hand 4th metacarpal   . Hand surgery Right 06/1985  . Hand surgery Left   . Wrist surgery Left   . Knee arthroscopy Left   . Foot surgery Bilateral   . Cardiac catheterization    . Colonoscopy    . Kyphoplasty N/A 12/21/2014    Procedure: KYPHOPLASTY LUMBAR ONE;  Surgeon: Newman Pies, MD;  Location: [facility];  Service: Neurosurgery;  Laterality: N/A;  L1 Kyphoplasty  . Shoulder arthroscopy with distal clavicle resection Right 05/20/2015    Procedure: SHOULDER ARTHROSCOPY WITH DISTAL CLAVICLE RESECTION DEBRIDEMENT PARTIAL ROTATOR CUFF LABRAL TEAR REMOVAL LOOSE BODIES;  Surgeon: Kathryne Hitch, MD;  Location: [facility];  Service: Orthopedics;  Laterality: Right;    Current Outpatient Prescriptions  Medication Sig Dispense Refill  . alendronate (FOSAMAX) 70 MG tablet Take 70 mg by mouth once a week. Take with a full glass  of water on an empty stomach.    Marland Kitchen aspirin 81 MG tablet Take 81 mg by mouth daily.    . Biotin 1000 MCG tablet Take 1,000 mcg by mouth 3 (three) times daily.    Marland Kitchen buPROPion (WELLBUTRIN SR) 150 MG 12 hr tablet Take 150 mg by mouth daily.    . cholestyramine (QUESTRAN) 4 G packet 1 packet in juice or water once daily. Take 2 hours away from other medications. 30 each 1  . citalopram (CELEXA) 40 MG tablet Take 40 mg by mouth every evening.     . diphenoxylate-atropine (LOMOTIL) 2.5-0.025 MG per tablet TAKE 1 TABLET BY MOUTH AFTER EACH LOOSE BOWEL MOVEMENT... UP TO 8 TABLETS A DAY  2  . Ferrous Sulfate (IRON) 325 (65 FE) MG TABS Take 1 tablet by mouth daily.    . fluticasone (FLONASE) 50 MCG/ACT nasal spray Place 2 sprays into the nose daily.    Marland Kitchen HYDROcodone-acetaminophen (NORCO) 10-325 MG per tablet Take 1 tablet by mouth every 6 (six) hours as needed.    Marland Kitchen levothyroxine (SYNTHROID, LEVOTHROID) 100 MCG tablet Take 100 mcg by mouth daily before breakfast.    . loratadine (CLARITIN) 10 MG tablet Take 10 mg by mouth daily.    . metFORMIN (GLUCOPHAGE) 500 MG tablet Take 500 mg by mouth daily with breakfast.     . omega-3 acid ethyl esters (LOVAZA) 1 G capsule Take 1 g by mouth daily.    Marland Kitchen omeprazole (PRILOSEC) 40 MG capsule Take 1 capsule (40 mg total) by mouth daily before breakfast. 30 capsule 11  . ondansetron (ZOFRAN) 4 MG tablet Take 1 tablet (4 mg total) by mouth every 8 (eight) hours as needed for nausea or vomiting. 40 tablet 0  . OVER THE COUNTER MEDICATION Take 600 mg by mouth daily. Calcium + D OTC 600mg  one a day    . oxyCODONE-acetaminophen (ROXICET) 5-325 MG per tablet Take 1-2 tablets by mouth every 4 (four) hours as needed. 60 tablet 0  . potassium chloride SA (K-DUR,KLOR-CON) 20 MEQ tablet Take 20 mEq by mouth 2 (two) times daily.    . pravastatin (PRAVACHOL) 20 MG tablet Take 20 mg by mouth at bedtime.     . traZODone (DESYREL) 50 MG tablet Take 50 mg by mouth at bedtime.    .  valsartan-hydrochlorothiazide (DIOVAN-HCT) 80-12.5 MG per tablet Take 1 tablet by mouth daily.     No current facility-administered medications for this visit.    Allergies as of 10/04/2015 - Review Complete 10/04/2015  Allergen Reaction Noted  . Penicillins Itching 04/06/2012  . Sulfa antibiotics Swelling 04/06/2012    Vitals: BP 136/78 mmHg  Pulse 76  Resp 20  Ht 4' 11.5" (1.511 m)  Wt 224 lb (101.606 kg)  BMI 44.50 kg/m2 Last Weight:  Wt Readings from Last 1 Encounters:  10/04/15 224 lb (101.606 kg)       Last Height:   Ht Readings from Last 1 Encounters:  10/04/15 4' 11.5" (1.511 m)  Physical exam:  General: The patient is awake, alert and appears not in acute distress. The patient is well groomed. Head: Normocephalic, atraumatic. Neck is supple. Mallampati 1 , uvula lifts midline. No tonsils.  neck circumference: 16 Nasal airflow restricited ,TMJ is not evident. Retrognathia is not seen.  Cardiovascular:  Regular rate and rhythm, without  murmurs or carotid bruit, and without distended neck veins. Respiratory: Lungs are clear to auscultation. Skin:  Without evidence of edema, or rash Trunk: BMI is elevated and patient  has normal posture.  Neurologic exam : The patient is awake and alert, oriented to place and time.   Memory subjective described as intact.  There is a normal attention span & concentration ability.  Speech is fluent without dysarthria, dysphonia or aphasia.  Mood and affect are appropriate.  Cranial nerves: Pupils are equal and briskly reactive to light. 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  Funduscopic exam impaired.  Extraocular movements in vertical and horizontal planes intact and without nystagmus. Visual fields by finger perimetry are intact. Hearing to finger rub intact. Facial sensation intact to fine touch.  Facial motor strength is symmetric and tongue and uvula move midline.  Motor exam: Normal tone,  muscle bulk and symmetric strength in all extremities.  Mrs. Ferraz had shoulder surgery on the right side she is now able to sleep on her right side again. She has good grip strength and range of motion in the shoulder.  Her shoulder shrug is symmetric.  Sensory:  Fine touch, pinprick and vibration were tested in all extremities. Proprioception is normal.  Coordination: Rapid alternating movements in the fingers/hands is normal. Finger-to-nose maneuver  normal without evidence of ataxia, dysmetria or tremor.  Gait and station: Patient walks without assistive device and is able unassisted to climb up to the exam table. Strength within normal limits. Stance is stable and normal. Tandem gait is affected by obesity . Deep tendon reflexes: in the  upper and lower extremities are symmetric and intact. Babinski maneuver response is  downgoing.   Assessment:  After physical and neurologic examination, review of laboratory studies, imaging, neurophysiology testing and pre-existing records, 20  minute assessment and discussion of the PSG , with more than 50% of face-to-face time dedicated to the discussion of the result of the recent sleep study as well as of the first download after sleep study and the review of the of high remaining sleepiness the high remaining fatigue and insomnia. Mrs. Meeuwsen is using a CPAP for at least 7 or 8 years that seems no longer to work. -she is followed by advanced home care. He has not had new supplies and she has not had compliance visits. Therefor we needed to establish a new baseline, which revealed mild sleep apnea with more central than obstructive events.  The patient was advised of the nature of the diagnosed sleep disorder , the treatment options and risks for general a health and wellness arising from not treating the condition. Visit duration was 30 minutes.   Plan:  Treatment plan and additional workup :  BMI is elevated, she is already changing her diet with  Dr Danna Hefty help. She has begun  following a carbohydrate reduction plan.  The patient will need a newer CPAP with a setting of 5-12 cm water auto pap.  She will be fitted for a new interface . I will follow her in 60 days after she has a new CPAP for download .   Asencion Partridge Jailani Hogans  MD  10/04/2015

## 2015-10-04 NOTE — Addendum Note (Signed)
Addended by: Lester Pajaro A on: 10/04/2015 02:14 PM   Modules accepted: Orders

## 2015-10-06 ENCOUNTER — Ambulatory Visit: Payer: Self-pay | Admitting: Neurology

## 2015-10-09 IMAGING — RF DG LUMBAR SPINE 2-3V
1 series · 2 of 2 positions shown · non-contrast
Comparison: MRI 11/05/2014

CLINICAL DATA: Patient for L1 kyphoplasty.

EXAM:
DG C-ARM 61-120 MIN; LUMBAR SPINE - 2-3 VIEW

[Series 1: run · 2 of 2 slices shown]
[im 1/2]
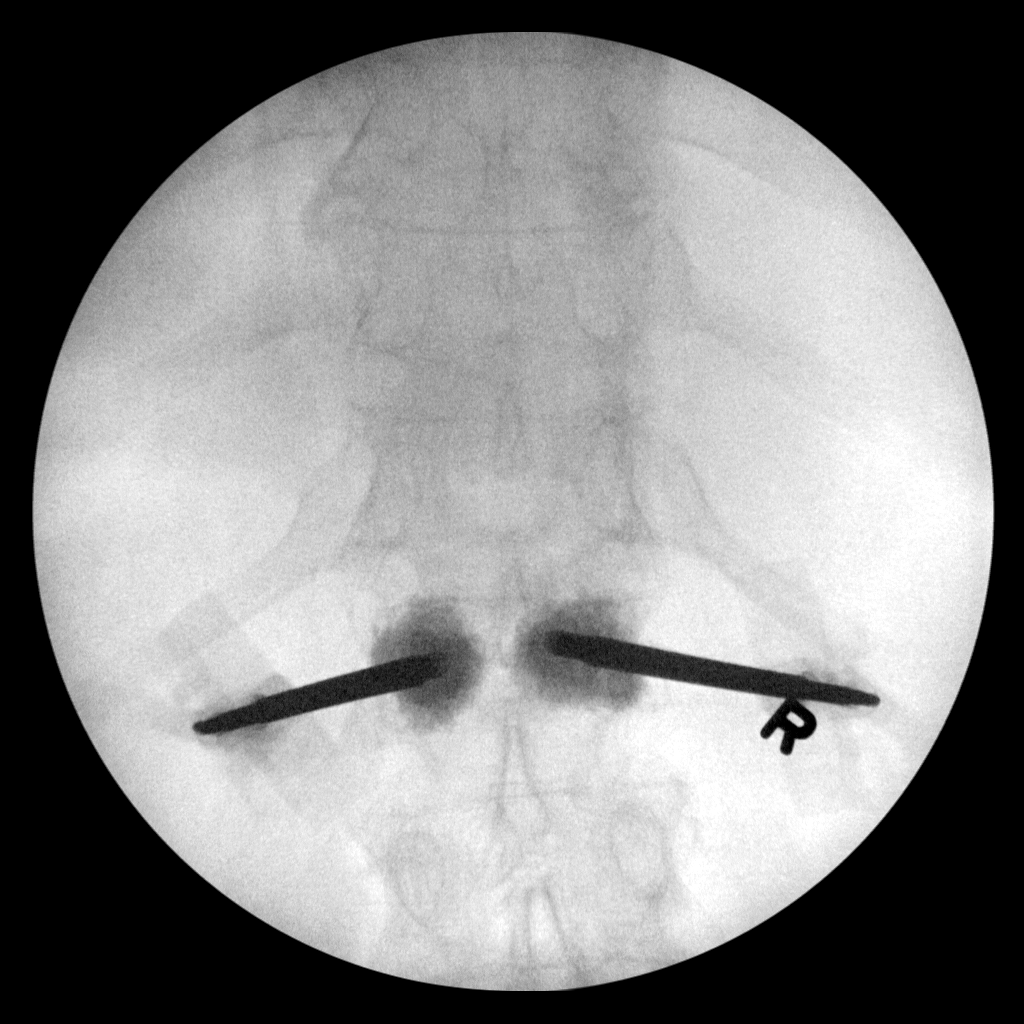
[im 2/2]
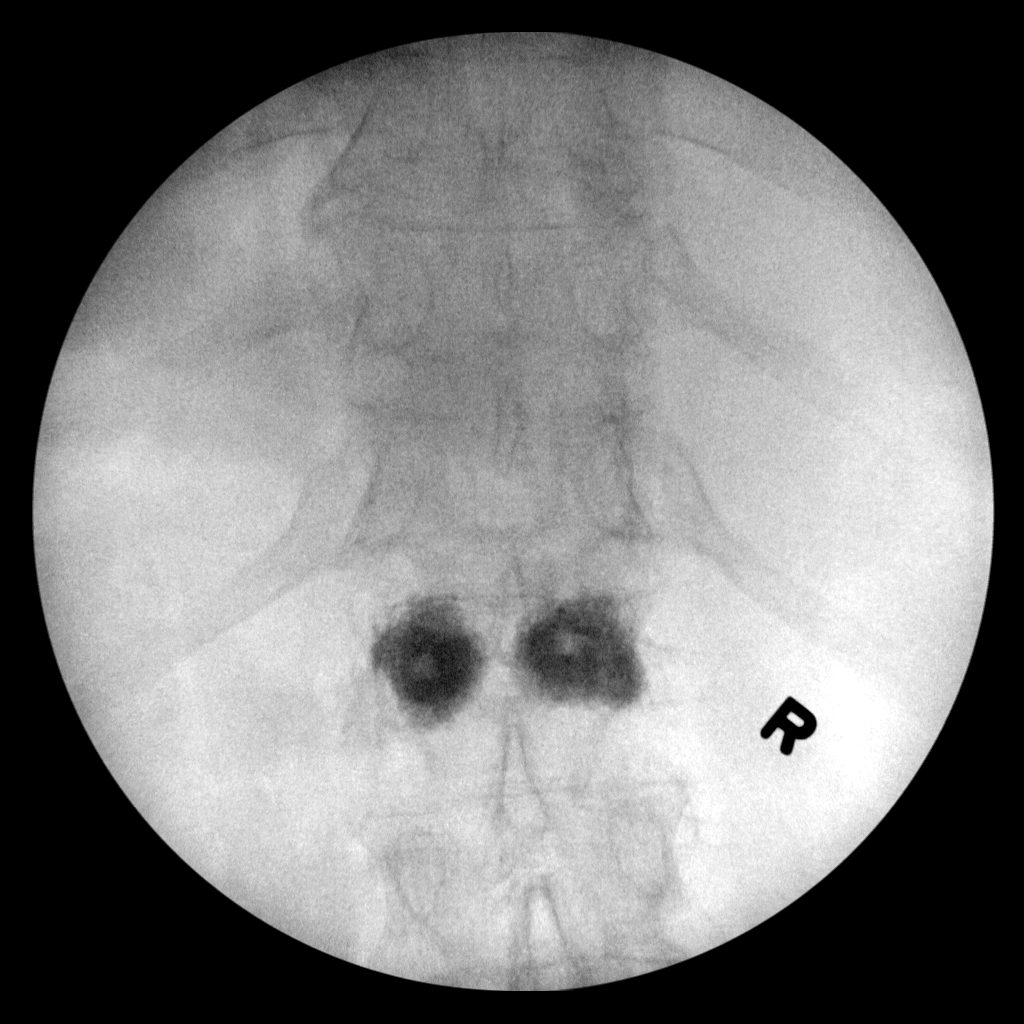

[2 of 2 positions shown; findings below may reference images not displayed]

FINDINGS: Kyphoplasty material is demonstrated within a superior lumbar
vertebral body. No definite evidence for acute abnormality.
IMPRESSION: Status post kyphoplasty.

## 2016-01-05 ENCOUNTER — Ambulatory Visit: Payer: Commercial Managed Care - HMO | Admitting: Neurology

## 2016-01-28 DIAGNOSIS — M5416 Radiculopathy, lumbar region: Secondary | ICD-10-CM | POA: Insufficient documentation

## 2016-02-28 ENCOUNTER — Encounter (HOSPITAL_COMMUNITY): Payer: Self-pay

## 2016-10-06 ENCOUNTER — Encounter (HOSPITAL_COMMUNITY)
Admission: RE | Admit: 2016-10-06 | Discharge: 2016-10-06 | Disposition: A | Discharge status: Home / Self Care | Payer: Commercial Managed Care - HMO | Source: Ambulatory Visit | Attending: Orthopedic Surgery | Admitting: Orthopedic Surgery

## 2016-10-06 ENCOUNTER — Encounter (HOSPITAL_COMMUNITY): Payer: Self-pay

## 2016-10-06 ENCOUNTER — Ambulatory Visit (HOSPITAL_COMMUNITY): Admission: RE | Admit: 2016-10-06 | Payer: Commercial Managed Care - HMO | Source: Ambulatory Visit

## 2016-10-06 DIAGNOSIS — E119 Type 2 diabetes mellitus without complications: Secondary | ICD-10-CM | POA: Insufficient documentation

## 2016-10-06 DIAGNOSIS — Z01818 Encounter for other preprocedural examination: Secondary | ICD-10-CM | POA: Diagnosis present

## 2016-10-06 HISTORY — DX: Dyspnea, unspecified: R06.00

## 2016-10-06 LAB — CBC WITH DIFFERENTIAL/PLATELET
Basophils Absolute: 0 10*3/uL (ref 0.0–0.1)
Basophils Relative: 1 %
Eosinophils Absolute: 0.2 10*3/uL (ref 0.0–0.7)
Eosinophils Relative: 3 %
HCT: 41.4 % (ref 36.0–46.0)
Hemoglobin: 13.9 g/dL (ref 12.0–15.0)
Lymphocytes Relative: 33 %
Lymphs Abs: 1.9 10*3/uL (ref 0.7–4.0)
MCH: 29 pg (ref 26.0–34.0)
MCHC: 33.6 g/dL (ref 30.0–36.0)
MCV: 86.3 fL (ref 78.0–100.0)
Monocytes Absolute: 0.5 10*3/uL (ref 0.1–1.0)
Monocytes Relative: 10 %
Neutro Abs: 3.1 10*3/uL (ref 1.7–7.7)
Neutrophils Relative %: 53 %
Platelets: 181 10*3/uL (ref 150–400)
RBC: 4.8 MIL/uL (ref 3.87–5.11)
RDW: 12.9 % (ref 11.5–15.5)
WBC: 5.7 10*3/uL (ref 4.0–10.5)

## 2016-10-06 LAB — COMPREHENSIVE METABOLIC PANEL
ALT: 27 U/L (ref 14–54)
AST: 36 U/L (ref 15–41)
Albumin: 3.9 g/dL (ref 3.5–5.0)
Alkaline Phosphatase: 53 U/L (ref 38–126)
Anion gap: 9 (ref 5–15)
BUN: 9 mg/dL (ref 6–20)
CO2: 25 mmol/L (ref 22–32)
Calcium: 9.8 mg/dL (ref 8.9–10.3)
Chloride: 107 mmol/L (ref 101–111)
Creatinine, Ser: 0.68 mg/dL (ref 0.44–1.00)
GFR calc Af Amer: >60 mL/min (ref >60)
GFR calc non Af Amer: >60 mL/min (ref >60)
Glucose, Bld: 99 mg/dL (ref 65–99)
Potassium: 3.7 mmol/L (ref 3.5–5.1)
Sodium: 141 mmol/L (ref 135–145)
Total Bilirubin: 0.9 mg/dL (ref 0.3–1.2)
Total Protein: 6.9 g/dL (ref 6.5–8.1)

## 2016-10-06 LAB — URINALYSIS, ROUTINE W REFLEX MICROSCOPIC
Bilirubin Urine: NEGATIVE
Glucose, UA: NEGATIVE mg/dL
Hgb urine dipstick: NEGATIVE
Ketones, ur: NEGATIVE mg/dL
Leukocytes, UA: NEGATIVE
Nitrite: NEGATIVE
Protein, ur: NEGATIVE mg/dL
Specific Gravity, Urine: 1.018 (ref 1.005–1.030)
pH: 5.5 (ref 5.0–8.0)

## 2016-10-06 LAB — TYPE AND SCREEN
ABO/RH(D): A POS
Antibody Screen: NEGATIVE

## 2016-10-06 LAB — ABO/RH: ABO/RH(D): A POS

## 2016-10-06 LAB — APTT: aPTT: 32 seconds (ref 24–36)

## 2016-10-06 LAB — SURGICAL PCR SCREEN
MRSA, PCR: NEGATIVE
Staphylococcus aureus: NEGATIVE

## 2016-10-06 LAB — PROTIME-INR
INR: 1.04
Prothrombin Time: 13.6 seconds (ref 11.4–15.2)

## 2016-10-06 NOTE — Progress Notes (Signed)
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  Patient does not check CBGs, they are going to take me off Metformin soon."

## 2016-10-06 NOTE — Pre-Procedure Instructions (Signed)
[name]  10/06/2016   Your procedure is scheduled on  Wednesday, October 25.  Report to [facility] Admitting at 6:30 AM                Your surgery or procedure is scheduled for 8:30 AM   Call this number if you have problems the morning of surgery:[phone]                For any other questions, please call [phone], Monday - Friday 8 AM - 4 PM.   Remember:  Do not eat food or drink liquids after midnight Tuesday, October 24.  Take these medicines the morning of surgery with A SIP OF WATER :buPROPion (WELLBUTRIN SR), citalopram (CELEXA), levothyroxine (SYNTHROID, LEVOTHROID), loratadine (CLARITIN),  omeprazole (PRILOSEC).              DO NOT take metFORMIN (GLUCOPHAGE-XR) the morning of surgery.             1 Week prior to surgery STOP taking Aspirin , Aspirin Products (Goody Powder, Excedrin Migraine), Ibuprofen (Advil), Naproxen (Aleve), Vitimins and Herbal Products (ie Fish Oil).              How to Manage Your Diabetes Before and After Surgery Why is it important to control my blood sugar before and after surgery? . Improving blood sugar levels before and after surgery helps healing and can limit problems. . A way of improving blood sugar control is eating a healthy diet by: o  Eating less sugar and carbohydrates o  Increasing activity/exercise o  Talking with your doctor about reaching your blood sugar goals . High blood sugars (greater than 180 mg/dL) can raise your risk of infections and slow your recovery, so you will need to focus on controlling your diabetes during the weeks before surgery. . Make sure that the doctor who takes care of your diabetes knows about your planned surgery including the date and location. How do I manage my blood sugar before surgery? . Check your blood sugar at least 4 times a day, starting 2 days before surgery, to make sure that the level is not too high or low. o Check your blood sugar the morning of your surgery when  you wake up and every 2 hours until you get to the Short Stay unit. . If your blood sugar is less than 70 mg/dL, you will need to treat for low blood sugar: o Treat a low blood sugar (less than 70 mg/dL) with  cup of clear juice (cranberry or apple), 4 glucose tablets, OR glucose gel. o Recheck blood sugar in 15 minutes after treatment (to make sure it is greater than 70 mg/dL). If your blood sugar is not greater than 70 mg/dL on recheck, call [phone] for further instructions. . Report your blood sugar to the short stay nurse when you get to Short Stay. . If you are admitted to the hospital after surgery: o Your blood sugar will be checked by the staff and you will probably be given insulin after surgery (instead of oral diabetes medicines) to make sure you have good blood sugar levels. o The goal for blood sugar control after surgery is 80-180 mg/dL  WHAT DO I DO ABOUT MY DIABETES MEDICATION? Marland Kitchen Do not take oral diabetes medicines (pills) the morning of surgery.      Patient's Name__________________________    Date:________________________________ Nurse Signature:  Date:    Do not wear jewelry, make-up or nail  polish.  Do not wear lotions, powders, or perfumes, or deodorant.  Do not shave 48 hours prior to surgery.    Do not bring valuables to the hospital.  [facility] is not responsible for any belongings or valuables.  Contacts, dentures or bridgework may not be worn into surgery.  Leave your suitcase in the car.  After surgery it may be brought to your room.  For patients admitted to the hospital, discharge time will be determined by your treatment team.  Special instructions: Review  [place] - Preparing For Surgery.  Please read over the following fact sheets that you were given. [place]- Preparing For Surgery and Patient Instructions for Mupirocin Application, Incentive Spirometry, Pain Booklet

## 2016-10-06 NOTE — Pre-Procedure Instructions (Addendum)
[name]  10/06/2016      [id number] - [place], [place] - [address] [place] [address] Phone: [phone] Fax: [phone]    Your procedure is scheduled on October 25th, Wednesday   Report to [facility] Admitting at 6:30 AM             (posted surgery time 8:30 am - 10:45 am)   Call this number if you have problems the MORNING of surgery:  [phone]   Remember:  Do not eat food or drink liquids after midnight Tuesday.   Take these medicines the morning of surgery with A SIP OF WATER : Wellbutrin, Celexa, Hydrocodone, Levothyroxine, Omerazole. Please use your Flonase that morning.   Do not wear jewelry, make-up or nail polish.  Do not wear lotions, powders, or perfumes, or deoderant.   Do not shave underarms & legs 48 hours prior to surgery.    Do not bring valuables to the hospital.  [facility] is not responsible for any belongings or valuables.  Contacts, dentures or bridgework may not be worn into surgery.  Leave your suitcase in the car.  After surgery it may be brought to your room. For patients admitted to the hospital, discharge time will be determined by your treatment team.  Please read over the following fact sheets that you were given. Pain Booklet, MRSA Information and Surgical Site Infection Prevention            How to Manage Your Diabetes Before and After Surgery  Why is it important to control my blood sugar before and after surgery? . Improving blood sugar levels before and after surgery helps healing and can limit problems. . A way of improving blood sugar control is eating a healthy diet by: o  Eating less sugar and carbohydrates o  Increasing activity/exercise o  Talking with your doctor about reaching your blood sugar goals . High blood sugars (greater than 180 mg/dL) can raise your risk of infections and slow your recovery, so you will need to focus on controlling your  diabetes during the weeks before surgery. . Make sure that the doctor who takes care of your diabetes knows about your planned surgery including the date and location.  How do I manage my blood sugar before surgery? . Check your blood sugar at least 4 times a day, starting 2 days before surgery, to make sure that the level is not too high or low. o Check your blood sugar the morning of your surgery when you wake up and every 2 hours until you get to the Short Stay unit. o  . If your blood sugar is less than 70 mg/dL, you will need to treat for low blood sugar: .  o Do not take insulin. o  o Treat a low blood sugar (less than 70 mg/dL) with  cup of clear juice (cranberry or apple), 4 glucose tablets, OR glucose gel. o  o Recheck blood sugar in 15 minutes after treatment (to make sure it is greater than 70 mg/dL). If your blood sugar is not greater than 70 mg/dL on recheck, call [phone] for further instructions. . Report your blood sugar to the short stay nurse when you get to Short Stay.  . If you are admitted to the hospital after surgery: o Your blood sugar will be checked by the staff and you will probably be given insulin after surgery (instead of oral diabetes medicines) to make  sure you have good blood sugar levels. o The goal for blood sugar control after surgery is 80-180 mg/dL.      WHAT DO I DO ABOUT MY DIABETES MEDICATION?   Marland Kitchen Do not take oral diabetes medicines (pills) the morning of surgery.  . The day of surgery, do not take other diabetes injectables, including Byetta (exenatide), Bydureon (exenatide ER), Victoza (liraglutide), or Trulicity (dulaglutide).  . If your CBG is greater than 220 mg/dL, you may take  of your sliding scale (correction) dose of insulin.  Other Instructions:          Patient Signature:  Date:   Nurse Signature:  Date:   Reviewed and Endorsed by [facility] Patient Education Committee, August 2015

## 2016-10-07 LAB — HEMOGLOBIN A1C
Hgb A1c MFr Bld: 5.4 % (ref 4.8–5.6)
Mean Plasma Glucose: 108 mg/dL

## 2016-10-08 LAB — URINE CULTURE
Culture: 30000 — AB
Special Requests: NORMAL

## 2016-10-09 NOTE — H&P (Addendum)
CHIEF COMPLAINT:  Painful right hip.   HPI: [name] is a very pleasant 70 year old white female who is seen today for evaluation of her right hip. She most recently started having pain and discomfort in her right hip. Initially she was seen in February 2017 with right groin pain.  The X-rays initially were quite benign. They did not reveal enough pathology for her pain. She did have some lumbar spine issue and an MRI scan was obtained of the hip which then showed a vascular necrosis with bone marrow edema involving the right femoral head and neck. This was the finding also on the MRI.  She had a corticosteroid injection which was initially ultrasound guided on 02-15-16. She actually had benefit from that. Unfortunately this started worsening in July and she underwent a right hip intra-articular injection with Dr. Ron Agee. She states that one certainly did not last as long. She continues to have pain and discomfort. She is seen today for an evaluation.   Her past medical history and general health is good.   Hospitalizations:  1. Abdominal hysterectomy 1964. 2. Cholecystectomy 1971. 3. Fracture of the right hand  1984 4. Fracture of the left hand 1984 with open reduction internal fixation. 5. Left wrist fracture 1984. 6. Left and right knee surgery. 7. Kyphoplasty lumbar spine. 8. Rectocele in 1990's.  9. Left foot surgery. 10. Right foot surgery. 11.  Nonsurgical admissions:  1962 childbirth. 1963 childbirth.   Medications:  Levothyroxine 112 micrograms a day. Bupropion SR   150 mg daily. Valsartan/HCTZ 80 mg-6.2 mg daily. Metformin ER 500 mg HS Citalopram 40 mg in the evening. Pravastatin 20 mg daily. Potassium 20 mEq b.i.d.  Omeprazole 40 mg daily. Alendronate Sodium 70 mg weekly. Trazodone 50 mg HS. Norco b.i.d.  Fluticasone 50 micrograms two sprays daily.   Generic Medications:  B-12 Biotin 100 mg daily. Vitamin D3 1000 IU daily,  Fish Oil 1000 mg with 300 mg of Omega 3  daily.  Calcium 1200 mg daily.  Garcinia Cambogia twice a day.  Loratadine 10 mg daily.  ALLERGIES:  SULFA, PENICILLIN - She gets erythema and itching.   Review of systems: The 14-point review of systems is positive for dyspnea on exertion. This is with brisk walks.  She has had hypertension for 20+ years and is on medication for this. She is a diabetic, borderline and has been on Metformin for the past 3-4 years. She has had apparently hyperthyroid and then hypothyroid problems and is presently on Levothyroxine.  She has had anemia in the past.  Nocturia one time per evening. She does have sleep apnea and does use a C-PAP.   Family history:  Positive for mother having hypertension and diabetes.   She has brothers who had myocardial infarctions and hypertension. Sister with lung cancer.    Social History: [name] is a 70 year old married white female who is retired from Psychiatric nurse at [employer]. She denies using tobacco or alcohol.   EXAMINATION: She is a 70 year old white female, well-developed, well-nourished. She is very cooperative. She is in marked distress secondary to right hip pain. HT:  4\' 11"   WT:  225 pounds.  Temp:  97.8    Pulse: 94  Blood pressure: 138/81  Oxygen Sat:  95%. Head:  Normocephalic.  Eyes: PERRLA with extraocular motion is intact. Ears, nose and throat:  Benign.  Chest: Good expansion.  Lungs:   Decreased breath sounds but were clear.  Cardiac: She had a regular  rhythm and rate. Normal S1 distant heart sounds.   Pulses:  1+ bilateral, symmetric in lower extremities.  Abdomen: Obese, soft and nontender. No masses palpable. Normal bowel sounds present. Genital, rectal and breast exam: Not indicated for an orthopedic evaluation. Musculoskeletal:  Today, she has minimal internal rotation.  Externally she can get to about 15 degrees which is quite painful. She can sit at 90 degrees of flexion.    X-RAYS: The MRI revealed avascular necrosis of the right femoral head  and neck.   IMPRESSION:  1. Avascular necrosis of the right hip.  2. Exogenous obesity.  3. Diabetes. 4. Hypothyroid. 5. Hypertension. 6. Sleep apnea.   PLAN:  At this time, I have reviewed her information and her chart. We feel at this time a right total hip arthroplasty is indicated. Therefore the procedure, risks and benefits were explained to her in detail using appropriate models.  All questions were encouraged and answered.   Mike Craze Franci Oshana, PA-C   10/09/2016 2:29 PM

## 2016-10-17 MED ORDER — TRANEXAMIC ACID 1000 MG/10ML IV SOLN
1000.0000 mg | INTRAVENOUS | Status: AC
  Administered 2016-10-18: 1000 mg via INTRAVENOUS
  Filled 2016-10-17: qty 10

## 2016-10-18 ENCOUNTER — Inpatient Hospital Stay (HOSPITAL_COMMUNITY): Payer: Commercial Managed Care - HMO | Admitting: Anesthesiology

## 2016-10-18 ENCOUNTER — Inpatient Hospital Stay (HOSPITAL_COMMUNITY): Payer: Commercial Managed Care - HMO

## 2016-10-18 ENCOUNTER — Inpatient Hospital Stay (HOSPITAL_COMMUNITY)
Admission: RE | Admit: 2016-10-18 | Discharge: 2016-10-20 | DRG: 470 | Disposition: A | Discharge status: Home Health | Payer: Commercial Managed Care - HMO | Source: Ambulatory Visit | Attending: Orthopedic Surgery | Admitting: Orthopedic Surgery

## 2016-10-18 ENCOUNTER — Encounter (HOSPITAL_COMMUNITY)
Admission: RE | Disposition: A | Discharge status: Home Health | Payer: Self-pay | Source: Ambulatory Visit | Attending: Orthopedic Surgery

## 2016-10-18 ENCOUNTER — Encounter (HOSPITAL_COMMUNITY): Payer: Self-pay | Admitting: *Deleted

## 2016-10-18 DIAGNOSIS — E119 Type 2 diabetes mellitus without complications: Secondary | ICD-10-CM | POA: Diagnosis present

## 2016-10-18 DIAGNOSIS — E039 Hypothyroidism, unspecified: Secondary | ICD-10-CM | POA: Diagnosis present

## 2016-10-18 DIAGNOSIS — I10 Essential (primary) hypertension: Secondary | ICD-10-CM | POA: Diagnosis present

## 2016-10-18 DIAGNOSIS — M879 Osteonecrosis, unspecified: Secondary | ICD-10-CM | POA: Diagnosis present

## 2016-10-18 DIAGNOSIS — Z7984 Long term (current) use of oral hypoglycemic drugs: Secondary | ICD-10-CM | POA: Diagnosis not present

## 2016-10-18 DIAGNOSIS — Z880 Allergy status to penicillin: Secondary | ICD-10-CM

## 2016-10-18 DIAGNOSIS — G4730 Sleep apnea, unspecified: Secondary | ICD-10-CM | POA: Diagnosis present

## 2016-10-18 DIAGNOSIS — Z882 Allergy status to sulfonamides status: Secondary | ICD-10-CM

## 2016-10-18 DIAGNOSIS — Z96649 Presence of unspecified artificial hip joint: Secondary | ICD-10-CM

## 2016-10-18 DIAGNOSIS — Z6841 Body Mass Index (BMI) 40.0 and over, adult: Secondary | ICD-10-CM

## 2016-10-18 DIAGNOSIS — E6609 Other obesity due to excess calories: Secondary | ICD-10-CM | POA: Diagnosis present

## 2016-10-18 DIAGNOSIS — M1611 Unilateral primary osteoarthritis, right hip: Secondary | ICD-10-CM | POA: Diagnosis present

## 2016-10-18 DIAGNOSIS — Z79899 Other long term (current) drug therapy: Secondary | ICD-10-CM

## 2016-10-18 DIAGNOSIS — Z419 Encounter for procedure for purposes other than remedying health state, unspecified: Secondary | ICD-10-CM

## 2016-10-18 DIAGNOSIS — M25551 Pain in right hip: Secondary | ICD-10-CM | POA: Diagnosis present

## 2016-10-18 HISTORY — PX: TOTAL HIP ARTHROPLASTY: SHX124

## 2016-10-18 LAB — GLUCOSE, CAPILLARY
Glucose-Capillary: 104 mg/dL — ABNORMAL HIGH (ref 65–99)
Glucose-Capillary: 138 mg/dL — ABNORMAL HIGH (ref 65–99)
Glucose-Capillary: 150 mg/dL — ABNORMAL HIGH (ref 65–99)
Glucose-Capillary: 118 mg/dL — ABNORMAL HIGH (ref 65–99)

## 2016-10-18 SURGERY — ARTHROPLASTY, HIP, TOTAL, ANTERIOR APPROACH
Anesthesia: General | Site: Hip | Laterality: Right

## 2016-10-18 MED ORDER — METHOCARBAMOL 1000 MG/10ML IJ SOLN
500.0000 mg | Freq: Four times a day (QID) | INTRAVENOUS | Status: DC | PRN
  Administered 2016-10-18: 500 mg via INTRAVENOUS
  Filled 2016-10-18 (×2): qty 5

## 2016-10-18 MED ORDER — ROCURONIUM BROMIDE 100 MG/10ML IV SOLN
INTRAVENOUS | Status: DC | PRN
  Administered 2016-10-18: 10 mg via INTRAVENOUS
  Administered 2016-10-18: 35 mg via INTRAVENOUS

## 2016-10-18 MED ORDER — BISACODYL 10 MG RE SUPP: 10.0000 mg | Freq: Every day | RECTAL | Status: DC | PRN

## 2016-10-18 MED ORDER — CLINDAMYCIN PHOSPHATE 600 MG/50ML IV SOLN
600.0000 mg | Freq: Four times a day (QID) | INTRAVENOUS | Status: AC
  Administered 2016-10-18 (×2): 600 mg via INTRAVENOUS
  Filled 2016-10-18 (×2): qty 50

## 2016-10-18 MED ORDER — ACETAMINOPHEN 325 MG PO TABS: 650.0000 mg | ORAL_TABLET | Freq: Four times a day (QID) | ORAL | Status: DC | PRN

## 2016-10-18 MED ORDER — ONDANSETRON HCL 4 MG/2ML IJ SOLN: 4.0000 mg | Freq: Four times a day (QID) | INTRAMUSCULAR | Status: DC | PRN

## 2016-10-18 MED ORDER — LORATADINE 10 MG PO TABS
10.0000 mg | ORAL_TABLET | Freq: Every day | ORAL | Status: DC
  Administered 2016-10-19 – 2016-10-20 (×2): 10 mg via ORAL
  Filled 2016-10-18 (×2): qty 1

## 2016-10-18 MED ORDER — OXYCODONE HCL 5 MG PO TABS
5.0000 mg | ORAL_TABLET | ORAL | Status: DC | PRN
  Administered 2016-10-18 – 2016-10-20 (×9): 10 mg via ORAL
  Filled 2016-10-18 (×9): qty 2

## 2016-10-18 MED ORDER — MIDAZOLAM HCL 5 MG/5ML IJ SOLN
INTRAMUSCULAR | Status: DC | PRN
  Administered 2016-10-18 (×2): 1 mg via INTRAVENOUS

## 2016-10-18 MED ORDER — HYDROCHLOROTHIAZIDE 12.5 MG PO CAPS
12.5000 mg | ORAL_CAPSULE | Freq: Every day | ORAL | Status: DC
  Administered 2016-10-18 – 2016-10-20 (×3): 12.5 mg via ORAL
  Filled 2016-10-18 (×3): qty 1

## 2016-10-18 MED ORDER — TRIAMCINOLONE ACETONIDE 0.1 % EX CREA
1.0000 "application " | TOPICAL_CREAM | Freq: Every day | CUTANEOUS | Status: DC | PRN
  Filled 2016-10-18: qty 15

## 2016-10-18 MED ORDER — DOCUSATE SODIUM 100 MG PO CAPS
100.0000 mg | ORAL_CAPSULE | Freq: Two times a day (BID) | ORAL | Status: DC
  Administered 2016-10-18 – 2016-10-20 (×5): 100 mg via ORAL
  Filled 2016-10-18 (×5): qty 1

## 2016-10-18 MED ORDER — ASPIRIN EC 325 MG PO TBEC
325.0000 mg | DELAYED_RELEASE_TABLET | Freq: Every day | ORAL | Status: DC
  Administered 2016-10-19 – 2016-10-20 (×2): 325 mg via ORAL
  Filled 2016-10-18 (×2): qty 1

## 2016-10-18 MED ORDER — MENTHOL 3 MG MT LOZG: 1.0000 | LOZENGE | OROMUCOSAL | Status: DC | PRN

## 2016-10-18 MED ORDER — PROPOFOL 10 MG/ML IV BOLUS
INTRAVENOUS | Status: DC | PRN
  Administered 2016-10-18: 50 mg via INTRAVENOUS
  Administered 2016-10-18: 120 mg via INTRAVENOUS

## 2016-10-18 MED ORDER — HYDROMORPHONE HCL 2 MG/ML IJ SOLN
0.5000 mg | INTRAMUSCULAR | Status: DC | PRN
  Administered 2016-10-18: 1 mg via INTRAVENOUS
  Filled 2016-10-18: qty 1

## 2016-10-18 MED ORDER — BUPIVACAINE LIPOSOME 1.3 % IJ SUSP
20.0000 mL | INTRAMUSCULAR | Status: DC
  Filled 2016-10-18: qty 20

## 2016-10-18 MED ORDER — METOCLOPRAMIDE HCL 5 MG PO TABS: 5.0000 mg | ORAL_TABLET | Freq: Three times a day (TID) | ORAL | Status: DC | PRN

## 2016-10-18 MED ORDER — PHENOL 1.4 % MT LIQD: 1.0000 | OROMUCOSAL | Status: DC | PRN

## 2016-10-18 MED ORDER — BUPIVACAINE HCL (PF) 0.5 % IJ SOLN
INTRAMUSCULAR | Status: DC | PRN
  Administered 2016-10-18: 10 mL

## 2016-10-18 MED ORDER — IRBESARTAN 150 MG PO TABS
75.0000 mg | ORAL_TABLET | Freq: Every day | ORAL | Status: DC
  Administered 2016-10-18 – 2016-10-20 (×3): 75 mg via ORAL
  Filled 2016-10-18 (×3): qty 1

## 2016-10-18 MED ORDER — METFORMIN HCL ER 500 MG PO TB24
500.0000 mg | ORAL_TABLET | Freq: Every day | ORAL | Status: DC
  Administered 2016-10-19: 500 mg via ORAL
  Filled 2016-10-18: qty 1

## 2016-10-18 MED ORDER — CHLORHEXIDINE GLUCONATE 4 % EX LIQD: 60.0000 mL | Freq: Once | CUTANEOUS | Status: DC

## 2016-10-18 MED ORDER — TRAZODONE HCL 50 MG PO TABS
50.0000 mg | ORAL_TABLET | Freq: Every day | ORAL | Status: DC
Start: 2016-10-18 — End: 2016-10-20
  Administered 2016-10-18 – 2016-10-19 (×2): 50 mg via ORAL
  Filled 2016-10-18 (×2): qty 1

## 2016-10-18 MED ORDER — ONDANSETRON HCL 4 MG/2ML IJ SOLN
INTRAMUSCULAR | Status: AC
  Filled 2016-10-18: qty 2

## 2016-10-18 MED ORDER — METOCLOPRAMIDE HCL 5 MG/ML IJ SOLN: 5.0000 mg | Freq: Three times a day (TID) | INTRAMUSCULAR | Status: DC | PRN

## 2016-10-18 MED ORDER — OXYCODONE HCL 5 MG PO TABS
5.0000 mg | ORAL_TABLET | Freq: Once | ORAL | Status: AC | PRN
  Administered 2016-10-18: 5 mg via ORAL

## 2016-10-18 MED ORDER — EPHEDRINE SULFATE 50 MG/ML IJ SOLN
INTRAMUSCULAR | Status: DC | PRN
  Administered 2016-10-18: 10 mg via INTRAVENOUS

## 2016-10-18 MED ORDER — INSULIN ASPART 100 UNIT/ML ~~LOC~~ SOLN
0.0000 [IU] | Freq: Three times a day (TID) | SUBCUTANEOUS | Status: DC
  Administered 2016-10-18: 2 [IU] via SUBCUTANEOUS
  Administered 2016-10-19: 3 [IU] via SUBCUTANEOUS
  Administered 2016-10-19 – 2016-10-20 (×3): 2 [IU] via SUBCUTANEOUS

## 2016-10-18 MED ORDER — LEVOTHYROXINE SODIUM 112 MCG PO TABS
112.0000 ug | ORAL_TABLET | Freq: Every day | ORAL | Status: DC
  Administered 2016-10-19 – 2016-10-20 (×2): 112 ug via ORAL
  Filled 2016-10-18 (×2): qty 1

## 2016-10-18 MED ORDER — ONDANSETRON HCL 4 MG/2ML IJ SOLN
INTRAMUSCULAR | Status: DC | PRN
  Administered 2016-10-18: 4 mg via INTRAVENOUS

## 2016-10-18 MED ORDER — ROCURONIUM BROMIDE 10 MG/ML (PF) SYRINGE
PREFILLED_SYRINGE | INTRAVENOUS | Status: AC
  Filled 2016-10-18: qty 10

## 2016-10-18 MED ORDER — PHENYLEPHRINE HCL 10 MG/ML IJ SOLN
INTRAMUSCULAR | Status: DC | PRN
  Administered 2016-10-18: 40 ug via INTRAVENOUS
  Administered 2016-10-18 (×3): 80 ug via INTRAVENOUS

## 2016-10-18 MED ORDER — CLINDAMYCIN PHOSPHATE 600 MG/50ML IV SOLN
600.0000 mg | Freq: Once | INTRAVENOUS | Status: AC
  Administered 2016-10-18: 600 mg via INTRAVENOUS

## 2016-10-18 MED ORDER — OXYCODONE HCL 5 MG PO TABS
ORAL_TABLET | ORAL | Status: AC
  Filled 2016-10-18: qty 1

## 2016-10-18 MED ORDER — ACETAMINOPHEN 650 MG RE SUPP: 650.0000 mg | Freq: Four times a day (QID) | RECTAL | Status: DC | PRN

## 2016-10-18 MED ORDER — MAGNESIUM CITRATE PO SOLN: 1.0000 | Freq: Once | ORAL | Status: DC | PRN

## 2016-10-18 MED ORDER — FENTANYL CITRATE (PF) 100 MCG/2ML IJ SOLN
INTRAMUSCULAR | Status: AC
  Filled 2016-10-18: qty 2

## 2016-10-18 MED ORDER — POLYETHYLENE GLYCOL 3350 17 G PO PACK: 17.0000 g | PACK | Freq: Every day | ORAL | Status: DC | PRN

## 2016-10-18 MED ORDER — CHLORHEXIDINE GLUCONATE 4 % EX LIQD
60.0000 mL | Freq: Once | CUTANEOUS | Status: DC
Start: 2016-10-18 — End: 2016-10-18

## 2016-10-18 MED ORDER — FENTANYL CITRATE (PF) 100 MCG/2ML IJ SOLN
INTRAMUSCULAR | Status: DC | PRN
  Administered 2016-10-18: 50 ug via INTRAVENOUS
  Administered 2016-10-18: 75 ug via INTRAVENOUS
  Administered 2016-10-18 (×4): 50 ug via INTRAVENOUS

## 2016-10-18 MED ORDER — LIDOCAINE 2% (20 MG/ML) 5 ML SYRINGE
INTRAMUSCULAR | Status: DC | PRN
  Administered 2016-10-18: 60 mg via INTRAVENOUS

## 2016-10-18 MED ORDER — HYDROMORPHONE HCL 2 MG/ML IJ SOLN
INTRAMUSCULAR | Status: AC
  Administered 2016-10-18: 0.5 mg
  Filled 2016-10-18: qty 1

## 2016-10-18 MED ORDER — BUPROPION HCL ER (SR) 150 MG PO TB12
150.0000 mg | ORAL_TABLET | Freq: Every day | ORAL | Status: DC
  Administered 2016-10-19 – 2016-10-20 (×2): 150 mg via ORAL
  Filled 2016-10-18 (×3): qty 1

## 2016-10-18 MED ORDER — 0.9 % SODIUM CHLORIDE (POUR BTL) OPTIME
TOPICAL | Status: DC | PRN
  Administered 2016-10-18: 1000 mL

## 2016-10-18 MED ORDER — METHOCARBAMOL 500 MG PO TABS
500.0000 mg | ORAL_TABLET | Freq: Four times a day (QID) | ORAL | Status: DC | PRN
  Administered 2016-10-18 – 2016-10-20 (×6): 500 mg via ORAL
  Filled 2016-10-18 (×6): qty 1

## 2016-10-18 MED ORDER — ONDANSETRON HCL 4 MG PO TABS: 4.0000 mg | ORAL_TABLET | Freq: Four times a day (QID) | ORAL | Status: DC | PRN

## 2016-10-18 MED ORDER — CEFAZOLIN SODIUM-DEXTROSE 2-4 GM/100ML-% IV SOLN
INTRAVENOUS | Status: AC
  Filled 2016-10-18: qty 100

## 2016-10-18 MED ORDER — BUPIVACAINE HCL (PF) 0.5 % IJ SOLN
INTRAMUSCULAR | Status: AC
  Filled 2016-10-18: qty 30

## 2016-10-18 MED ORDER — OXYCODONE-ACETAMINOPHEN 5-325 MG PO TABS
1.0000 | ORAL_TABLET | ORAL | Status: DC | PRN
  Administered 2016-10-19 (×2): 2 via ORAL
  Filled 2016-10-18 (×2): qty 2

## 2016-10-18 MED ORDER — SUGAMMADEX SODIUM 200 MG/2ML IV SOLN
INTRAVENOUS | Status: AC
  Filled 2016-10-18: qty 2

## 2016-10-18 MED ORDER — CITALOPRAM HYDROBROMIDE 40 MG PO TABS
40.0000 mg | ORAL_TABLET | Freq: Every evening | ORAL | Status: DC
  Administered 2016-10-18 – 2016-10-19 (×2): 40 mg via ORAL
  Filled 2016-10-18 (×2): qty 1

## 2016-10-18 MED ORDER — LIDOCAINE 2% (20 MG/ML) 5 ML SYRINGE
INTRAMUSCULAR | Status: AC
  Filled 2016-10-18: qty 5

## 2016-10-18 MED ORDER — DIPHENHYDRAMINE HCL 12.5 MG/5ML PO ELIX: 12.5000 mg | ORAL_SOLUTION | ORAL | Status: DC | PRN

## 2016-10-18 MED ORDER — POTASSIUM CHLORIDE IN NACL 20-0.9 MEQ/L-% IV SOLN
INTRAVENOUS | Status: DC
  Administered 2016-10-18: 15:00:00 via INTRAVENOUS
  Filled 2016-10-18 (×2): qty 1000

## 2016-10-18 MED ORDER — OXYCODONE HCL 5 MG/5ML PO SOLN: 5.0000 mg | Freq: Once | ORAL | Status: AC | PRN

## 2016-10-18 MED ORDER — SUGAMMADEX SODIUM 200 MG/2ML IV SOLN
INTRAVENOUS | Status: DC | PRN
  Administered 2016-10-18: 200 mg via INTRAVENOUS

## 2016-10-18 MED ORDER — CLINDAMYCIN PHOSPHATE 600 MG/50ML IV SOLN
INTRAVENOUS | Status: AC
  Filled 2016-10-18: qty 50

## 2016-10-18 MED ORDER — PANTOPRAZOLE SODIUM 40 MG PO TBEC
80.0000 mg | DELAYED_RELEASE_TABLET | Freq: Every day | ORAL | Status: DC
  Administered 2016-10-19 – 2016-10-20 (×2): 80 mg via ORAL
  Filled 2016-10-18 (×2): qty 2

## 2016-10-18 MED ORDER — VALSARTAN-HYDROCHLOROTHIAZIDE 80-12.5 MG PO TABS: 1.0000 | ORAL_TABLET | Freq: Every day | ORAL | Status: DC

## 2016-10-18 MED ORDER — PROPOFOL 10 MG/ML IV BOLUS
INTRAVENOUS | Status: AC
  Filled 2016-10-18: qty 40

## 2016-10-18 MED ORDER — LACTATED RINGERS IV SOLN
INTRAVENOUS | Status: DC | PRN
  Administered 2016-10-18 (×2): via INTRAVENOUS

## 2016-10-18 MED ORDER — BUPIVACAINE LIPOSOME 1.3 % IJ SUSP
INTRAMUSCULAR | Status: DC | PRN
  Administered 2016-10-18: 20 mL

## 2016-10-18 MED ORDER — HYDROMORPHONE HCL 1 MG/ML IJ SOLN
0.2500 mg | INTRAMUSCULAR | Status: DC | PRN
  Administered 2016-10-18: 0.5 mg via INTRAVENOUS

## 2016-10-18 MED ORDER — SODIUM CHLORIDE 0.9 % IV SOLN
75.0000 mL/h | INTRAVENOUS | Status: DC
Start: 2016-10-18 — End: 2016-10-18

## 2016-10-18 MED ORDER — MIDAZOLAM HCL 2 MG/2ML IJ SOLN
INTRAMUSCULAR | Status: AC
  Filled 2016-10-18: qty 2

## 2016-10-18 MED ORDER — INSULIN ASPART 100 UNIT/ML ~~LOC~~ SOLN
4.0000 [IU] | Freq: Three times a day (TID) | SUBCUTANEOUS | Status: DC
  Administered 2016-10-18 – 2016-10-20 (×5): 4 [IU] via SUBCUTANEOUS

## 2016-10-18 MED ORDER — FLUTICASONE PROPIONATE 50 MCG/ACT NA SUSP
2.0000 | Freq: Every day | NASAL | Status: DC
  Filled 2016-10-18: qty 16

## 2016-10-18 MED ORDER — ALUM & MAG HYDROXIDE-SIMETH 200-200-20 MG/5ML PO SUSP: 30.0000 mL | ORAL | Status: DC | PRN

## 2016-10-18 MED ORDER — DEXAMETHASONE SODIUM PHOSPHATE 10 MG/ML IJ SOLN
10.0000 mg | Freq: Once | INTRAMUSCULAR | Status: AC
  Administered 2016-10-19: 10 mg via INTRAVENOUS
  Filled 2016-10-18: qty 1

## 2016-10-18 SURGICAL SUPPLY — 63 items
BLADE SAW SGTL 18X1.27X75 (BLADE) ×2 IMPLANT
BLADE SAW SGTL 18X1.27X75MM (BLADE) ×1
BLADE SURG ROTATE 9660 (MISCELLANEOUS) IMPLANT
CAPT HIP TOTAL 2 ×2 IMPLANT
CELLS DAT CNTRL 66122 CELL SVR (MISCELLANEOUS) ×1 IMPLANT
CLOSURE STERI-STRIP 1/2X4 (GAUZE/BANDAGES/DRESSINGS) ×2
CLOSURE WOUND 1/2 X4 (GAUZE/BANDAGES/DRESSINGS) ×1
CLSR STERI-STRIP ANTIMIC 1/2X4 (GAUZE/BANDAGES/DRESSINGS) ×4 IMPLANT
COVER SURGICAL LIGHT HANDLE (MISCELLANEOUS) ×3 IMPLANT
DRAPE C-ARM 42X72 X-RAY (DRAPES) ×3 IMPLANT
DRAPE IMP U-DRAPE 54X76 (DRAPES) ×9 IMPLANT
DRAPE INCISE IOBAN 66X45 STRL (DRAPES) ×3 IMPLANT
DRAPE ORTHO SPLIT 77X108 STRL (DRAPES) ×6
DRAPE PROXIMA HALF (DRAPES) ×3 IMPLANT
DRAPE STERI IOBAN 125X83 (DRAPES) ×3 IMPLANT
DRAPE SURG 17X23 STRL (DRAPES) ×3 IMPLANT
DRAPE SURG ORHT 6 SPLT 77X108 (DRAPES) ×2 IMPLANT
DRAPE U-SHAPE 47X51 STRL (DRAPES) ×3 IMPLANT
DRSG AQUACEL AG ADV 3.5X10 (GAUZE/BANDAGES/DRESSINGS) ×3 IMPLANT
DURAPREP 26ML APPLICATOR (WOUND CARE) ×3 IMPLANT
ELECT BLADE 4.0 EZ CLEAN MEGAD (MISCELLANEOUS) ×3
ELECT CAUTERY BLADE 6.4 (BLADE) ×3 IMPLANT
ELECT REM PT RETURN 9FT ADLT (ELECTROSURGICAL) ×3
ELECTRODE BLDE 4.0 EZ CLN MEGD (MISCELLANEOUS) IMPLANT
ELECTRODE REM PT RTRN 9FT ADLT (ELECTROSURGICAL) ×1 IMPLANT
FACESHIELD WRAPAROUND (MASK) ×6 IMPLANT
FACESHIELD WRAPAROUND OR TEAM (MASK) ×2 IMPLANT
GLOVE BIOGEL PI IND STRL 7.0 (GLOVE) IMPLANT
GLOVE BIOGEL PI INDICATOR 7.0 (GLOVE)
GLOVE ECLIPSE 7.0 STRL STRAW (GLOVE) IMPLANT
GLOVE ORTHO TXT STRL SZ7.5 (GLOVE) ×6 IMPLANT
GOWN STRL REUS W/ TWL LRG LVL3 (GOWN DISPOSABLE) ×3 IMPLANT
GOWN STRL REUS W/ TWL XL LVL3 (GOWN DISPOSABLE) ×1 IMPLANT
GOWN STRL REUS W/TWL LRG LVL3 (GOWN DISPOSABLE) ×6
GOWN STRL REUS W/TWL XL LVL3 (GOWN DISPOSABLE) ×9
KIT BASIN OR (CUSTOM PROCEDURE TRAY) ×3 IMPLANT
KIT ROOM TURNOVER OR (KITS) ×3 IMPLANT
MANIFOLD NEPTUNE II (INSTRUMENTS) ×3 IMPLANT
NDL HYPO 25GX1X1/2 BEV (NEEDLE) IMPLANT
NDL SAFETY ECLIPSE 18X1.5 (NEEDLE) ×1 IMPLANT
NEEDLE HYPO 18GX1.5 SHARP (NEEDLE) ×3
NEEDLE HYPO 25GX1X1/2 BEV (NEEDLE) ×3 IMPLANT
NS IRRIG 1000ML POUR BTL (IV SOLUTION) ×3 IMPLANT
PACK TOTAL JOINT (CUSTOM PROCEDURE TRAY) ×3 IMPLANT
PAD ARMBOARD 7.5X6 YLW CONV (MISCELLANEOUS) ×6 IMPLANT
RETRACTOR WND ALEXIS 18 MED (MISCELLANEOUS) ×1 IMPLANT
RTRCTR WOUND ALEXIS 18CM MED (MISCELLANEOUS) ×3
STRIP CLOSURE SKIN 1/2X4 (GAUZE/BANDAGES/DRESSINGS) ×1 IMPLANT
SUCTION FRAZIER HANDLE 10FR (MISCELLANEOUS) ×2
SUCTION TUBE FRAZIER 10FR DISP (MISCELLANEOUS) ×1 IMPLANT
SUT ETHIBOND NAB CT1 #1 30IN (SUTURE) IMPLANT
SUT MNCRL AB 4-0 PS2 18 (SUTURE) ×3 IMPLANT
SUT VIC AB 0 CT1 27 (SUTURE) ×6
SUT VIC AB 0 CT1 27XBRD ANBCTR (SUTURE) ×1 IMPLANT
SUT VIC AB 1 CT1 27 (SUTURE)
SUT VIC AB 1 CT1 27XBRD ANBCTR (SUTURE) IMPLANT
SUT VIC AB 2-0 CT1 27 (SUTURE) ×6
SUT VIC AB 2-0 CT1 TAPERPNT 27 (SUTURE) ×2 IMPLANT
SYR 50ML LL SCALE MARK (SYRINGE) ×3 IMPLANT
SYR CONTROL 10ML LL (SYRINGE) ×2 IMPLANT
TOWEL OR 17X24 6PK STRL BLUE (TOWEL DISPOSABLE) ×3 IMPLANT
TOWEL OR 17X26 10 PK STRL BLUE (TOWEL DISPOSABLE) ×3 IMPLANT
TRAY FOLEY CATH 14FR (SET/KITS/TRAYS/PACK) IMPLANT

## 2016-10-18 NOTE — Anesthesia Preprocedure Evaluation (Addendum)
Anesthesia Evaluation  Patient identified by MRN, date of birth, ID band Patient awake    Reviewed: Allergy & Precautions, H&P , NPO status , Patient's Chart, lab work & pertinent test results  Airway Mallampati: II   Neck ROM: full    Dental   Pulmonary shortness of breath, sleep apnea , former smoker,    breath sounds clear to auscultation       Cardiovascular hypertension,  Rhythm:regular Rate:Normal     Neuro/Psych PSYCHIATRIC DISORDERS Anxiety Depression    GI/Hepatic hiatal hernia, GERD  ,  Endo/Other  diabetes, Type 2  Renal/GU      Musculoskeletal  (+) Arthritis , Fibromyalgia -  Abdominal   Peds  Hematology   Anesthesia Other Findings   Reproductive/Obstetrics                            Anesthesia Physical Anesthesia Plan  ASA: III  Anesthesia Plan: General   Post-op Pain Management:    Induction: Intravenous  Airway Management Planned: Oral ETT  Additional Equipment:   Intra-op Plan:   Post-operative Plan: Extubation in OR  Informed Consent: I have reviewed the patients History and Physical, chart, labs and discussed the procedure including the risks, benefits and alternatives for the proposed anesthesia with the patient or authorized representative who has indicated his/her understanding and acceptance.     Plan Discussed with: CRNA, Anesthesiologist and Surgeon  Anesthesia Plan Comments:         Anesthesia Quick Evaluation

## 2016-10-18 NOTE — Transfer of Care (Signed)
Immediate Anesthesia Transfer of Care Note  Patient: [name]  Procedure(s) Performed: Procedure(s): TOTAL HIP ARTHROPLASTY ANTERIOR APPROACH (Right)  Patient Location: PACU  Anesthesia Type:General  Level of Consciousness: awake, alert  and oriented  Airway & Oxygen Therapy: Patient Spontanous Breathing and Patient connected to face mask oxygen  Post-op Assessment: Report given to RN and Post -op Vital signs reviewed and stable  Post vital signs: Reviewed and stable  Last Vitals:  Vitals:   10/18/16 1115 10/18/16 1116  BP:  117/69  Pulse: 86 87  Resp: 18 (!) 21  Temp:      Last Pain:  Vitals:   10/18/16 0703  TempSrc: Oral  PainSc:       Patients Stated Pain Goal: 2 ([id number])  Complications: No apparent anesthesia complications

## 2016-10-18 NOTE — Anesthesia Procedure Notes (Signed)
Procedure Name: Intubation Date/Time: 10/18/2016 8:40 AM Performed by: Lieutenant Diego Pre-anesthesia Checklist: Patient identified, Emergency Drugs available, Suction available and Patient being monitored Patient Re-evaluated:Patient Re-evaluated prior to inductionOxygen Delivery Method: Circle system utilized Preoxygenation: Pre-oxygenation with 100% oxygen Intubation Type: IV induction Ventilation: Mask ventilation without difficulty Laryngoscope Size: Miller and 2 Grade View: Grade I Tube type: Oral Tube size: 7.0 mm Number of attempts: 1 Airway Equipment and Method: Stylet and Oral airway Placement Confirmation: ETT inserted through vocal cords under direct vision,  positive ETCO2 and breath sounds checked- equal and bilateral Secured at: 22 cm Tube secured with: Tape Dental Injury: Teeth and Oropharynx as per pre-operative assessment

## 2016-10-18 NOTE — Progress Notes (Signed)
Patient declines the use of nocturnal CPAP tonight. Equipment remains in the room, unopened at this time. She is aware that she may call for assistance at any time if she should change her mind and become more compliant.

## 2016-10-18 NOTE — Interval H&P Note (Signed)
History and Physical Interval Note:  10/18/2016 8:29 AM  [name]  has presented today for surgery, with the diagnosis of djd right hip  The various methods of treatment have been discussed with the patient and family. After consideration of risks, benefits and other options for treatment, the patient has consented to  Procedure(s): TOTAL HIP ARTHROPLASTY ANTERIOR APPROACH (Right) as a surgical intervention .  The patient's history has been reviewed, patient examined, no change in status, stable for surgery.  I have reviewed the patient's chart and labs.  Questions were answered to the patient's satisfaction.     Ninetta Lights

## 2016-10-18 NOTE — Evaluation (Signed)
Physical Therapy Evaluation Patient Details Name: [name] MRN: [id number] DOB: 18-Dec-1946 Today's Date: 10/18/2016   History of Present Illness  Pt is a 70 y.o. female now s/p Rt direct anterior THA. PMH: osteoporosis, HTN, fibromyalgia, diabetes, chronic back pain, anxiety.   Clinical Impression  Pt is s/p Rt direct anterior THA resulting in the deficits listed below (see PT Problem List). Pt able to ambulate 20 ft with rw, slow pattern but no loss of balance. Husband present and supportive during session.  Pt will benefit from skilled PT to increase their independence and safety with mobility to allow discharge to home with family support.      Follow Up Recommendations Home health PT;Supervision for mobility/OOB    Equipment Recommendations  None recommended by PT    Recommendations for Other Services       Precautions / Restrictions Precautions Precautions: Fall Restrictions Weight Bearing Restrictions: Yes RLE Weight Bearing: Weight bearing as tolerated      Mobility  Bed Mobility               General bed mobility comments: on BSC upon arrival  Transfers Overall transfer level: Needs assistance Equipment used: Rolling walker (2 wheeled) Transfers: Sit to/from Stand Sit to Stand: Min assist         General transfer comment: sit<>stand X2  Ambulation/Gait Ambulation/Gait assistance: Min guard Ambulation Distance (Feet): 20 Feet Assistive device: Rolling walker (2 wheeled) Gait Pattern/deviations: Decreased step length - right;Decreased step length - left;Decreased weight shift to right Gait velocity: decreased   General Gait Details: slow pattern with cues for sequence.  Stairs            Wheelchair Mobility    Modified Rankin (Stroke Patients Only)       Balance Overall balance assessment: Needs assistance Sitting-balance support: No upper extremity supported Sitting balance-Leahy Scale: Good     Standing balance support:  Bilateral upper extremity supported Standing balance-Leahy Scale: Poor Standing balance comment: using rw                             Pertinent Vitals/Pain Pain Assessment: Faces Faces Pain Scale: Hurts little more Pain Location: Rt hip Pain Descriptors / Indicators: Sore Pain Intervention(s): Limited activity within patient's tolerance;Monitored during session    Home Living Family/patient expects to be discharged to:: Private residence Living Arrangements: Spouse/significant other Available Help at Discharge: Family;Available 24 hours/day Type of Home: House Home Access: Ramped entrance     Home Layout: One level Home Equipment: Walker - 2 wheels;Cane - single point;Bedside commode Additional Comments: husband will be with pt at D/C    Prior Function Level of Independence: Independent with assistive device(s)         Comments: using SPC with ambulation     Hand Dominance        Extremity/Trunk Assessment   Upper Extremity Assessment: Overall WFL for tasks assessed           Lower Extremity Assessment: RLE deficits/detail RLE Deficits / Details: poor quad activation       Communication   Communication: No difficulties  Cognition Arousal/Alertness: Awake/alert Behavior During Therapy: WFL for tasks assessed/performed Overall Cognitive Status: Within Functional Limits for tasks assessed                      General Comments      Exercises     Assessment/Plan    PT  Assessment Patient needs continued PT services  PT Problem List Decreased strength;Decreased range of motion;Decreased activity tolerance;Decreased balance;Decreased mobility          PT Treatment Interventions DME instruction;Gait training;Stair training;Functional mobility training;Therapeutic activities;Therapeutic exercise;Patient/family education    PT Goals (Current goals can be found in the Care Plan section)  Acute Rehab PT Goals Patient Stated Goal: be  able to move better PT Goal Formulation: With patient Time For Goal Achievement: 11/01/16 Potential to Achieve Goals: Good    Frequency 7X/week   Barriers to discharge        Co-evaluation               End of Session Equipment Utilized During Treatment: Gait belt Activity Tolerance: Patient tolerated treatment well Patient left: in chair;with call bell/phone within reach;with family/visitor present;with nursing/sitter in room Nurse Communication: Mobility status;Weight bearing status         Time: [id number] PT Time Calculation (min) (ACUTE ONLY): 28 min   Charges:   PT Evaluation $PT Eval Moderate Complexity: 1 Procedure PT Treatments $Gait Training: 8-22 mins   PT G Codes:        Cassell Clement, PT, CSCS Pager [phone] Office [phone]  10/18/2016, 3:31 PM

## 2016-10-19 ENCOUNTER — Encounter (HOSPITAL_COMMUNITY): Payer: Self-pay | Admitting: Orthopedic Surgery

## 2016-10-19 LAB — GLUCOSE, CAPILLARY
Glucose-Capillary: 115 mg/dL — ABNORMAL HIGH (ref 65–99)
Glucose-Capillary: 124 mg/dL — ABNORMAL HIGH (ref 65–99)
Glucose-Capillary: 155 mg/dL — ABNORMAL HIGH (ref 65–99)
Glucose-Capillary: 106 mg/dL — ABNORMAL HIGH (ref 65–99)

## 2016-10-19 LAB — BASIC METABOLIC PANEL
Anion gap: 9 (ref 5–15)
BUN: 12 mg/dL (ref 6–20)
CO2: 24 mmol/L (ref 22–32)
Calcium: 8.1 mg/dL — ABNORMAL LOW (ref 8.9–10.3)
Chloride: 103 mmol/L (ref 101–111)
Creatinine, Ser: 0.82 mg/dL (ref 0.44–1.00)
GFR calc Af Amer: >60 mL/min (ref >60)
GFR calc non Af Amer: >60 mL/min (ref >60)
Glucose, Bld: 114 mg/dL — ABNORMAL HIGH (ref 65–99)
Potassium: 3.6 mmol/L (ref 3.5–5.1)
Sodium: 136 mmol/L (ref 135–145)

## 2016-10-19 LAB — CBC
HCT: 32.9 % — ABNORMAL LOW (ref 36.0–46.0)
Hemoglobin: 10.7 g/dL — ABNORMAL LOW (ref 12.0–15.0)
MCH: 28.8 pg (ref 26.0–34.0)
MCHC: 32.5 g/dL (ref 30.0–36.0)
MCV: 88.7 fL (ref 78.0–100.0)
Platelets: 172 10*3/uL (ref 150–400)
RBC: 3.71 MIL/uL — ABNORMAL LOW (ref 3.87–5.11)
RDW: 13.3 % (ref 11.5–15.5)
WBC: 10.5 10*3/uL (ref 4.0–10.5)

## 2016-10-19 LAB — HEMOGLOBIN A1C
Hgb A1c MFr Bld: 5.3 % (ref 4.8–5.6)
Mean Plasma Glucose: 105 mg/dL

## 2016-10-19 MED ORDER — ASPIRIN 325 MG PO TBEC: 325.0000 mg | DELAYED_RELEASE_TABLET | Freq: Every day | ORAL | 0 refills | Status: DC

## 2016-10-19 MED ORDER — OXYCODONE-ACETAMINOPHEN 5-325 MG PO TABS: 1.0000 | ORAL_TABLET | ORAL | 0 refills | Status: DC | PRN

## 2016-10-19 MED ORDER — ONDANSETRON HCL 4 MG PO TABS: 4.0000 mg | ORAL_TABLET | Freq: Four times a day (QID) | ORAL | 0 refills | Status: DC | PRN

## 2016-10-19 MED ORDER — METHOCARBAMOL 500 MG PO TABS: 500.0000 mg | ORAL_TABLET | Freq: Four times a day (QID) | ORAL | 0 refills | Status: DC | PRN

## 2016-10-19 MED ORDER — DOCUSATE SODIUM 100 MG PO CAPS: 100.0000 mg | ORAL_CAPSULE | Freq: Two times a day (BID) | ORAL | 0 refills | Status: DC

## 2016-10-19 NOTE — Progress Notes (Signed)
Physical Therapy Treatment Patient Details Name: [name] MRN: [id number] DOB: January 25, 1946 Today's Date: 10/19/2016    History of Present Illness Pt is a 70 y.o. female now s/p Rt direct anterior THA. PMH: osteoporosis, HTN, fibromyalgia, diabetes, chronic back pain, anxiety.     PT Comments    Pt making gradual progress with PT, able to ambulate 60 ft with rw. Following session, pt left in care of OT. Based upon the patient's current mobility, anticipate that the pt will D/C home with family support. PT to continue to follow and progress as tolerated.   Follow Up Recommendations  Home health PT;Supervision for mobility/OOB     Equipment Recommendations  None recommended by PT    Recommendations for Other Services       Precautions / Restrictions Precautions Precautions: Fall Restrictions Weight Bearing Restrictions: Yes RLE Weight Bearing: Weight bearing as tolerated    Mobility  Bed Mobility               General bed mobility comments: pt up in chair upon arrival  Transfers Overall transfer level: Needs assistance Equipment used: Rolling walker (2 wheeled) Transfers: Sit to/from Stand Sit to Stand: Min guard         General transfer comment: reminder for hand placement  Ambulation/Gait Ambulation/Gait assistance: Min guard Ambulation Distance (Feet): 60 Feet Assistive device: Rolling walker (2 wheeled) Gait Pattern/deviations: Step-to pattern;Step-through pattern Gait velocity: decreased   General Gait Details: working on even strides and step through pattern.    Stairs            Wheelchair Mobility    Modified Rankin (Stroke Patients Only)       Balance Overall balance assessment: Needs assistance Sitting-balance support: No upper extremity supported Sitting balance-Leahy Scale: Good     Standing balance support: No upper extremity supported;During functional activity Standing balance-Leahy Scale: Fair Standing balance  comment: able to stand static without UE support                    Cognition Arousal/Alertness: Awake/alert Behavior During Therapy: WFL for tasks assessed/performed Overall Cognitive Status: Within Functional Limits for tasks assessed                      Exercises Total Joint Exercises Ankle Circles/Pumps: AROM;Both;15 reps Quad Sets: Strengthening;Right;10 reps Heel Slides: AAROM;Right;10 reps Hip ABduction/ADduction: Strengthening;Right;10 reps    General Comments        Pertinent Vitals/Pain Pain Assessment: 0-10 Pain Score: 2  Pain Location: Rt hip Pain Descriptors / Indicators: Sore Pain Intervention(s): Limited activity within patient's tolerance;Monitored during session    Home Living Family/patient expects to be discharged to:: Private residence Living Arrangements: Spouse/significant other Available Help at Discharge: Family;Available 24 hours/day Type of Home: House Home Access: Ramped entrance   Home Layout: One level Home Equipment: Walker - 2 wheels;Cane - single point;Shower seat;Grab bars - tub/shower Additional Comments: husband will be with pt at D/C    Prior Function Level of Independence: Independent with assistive device(s)      Comments: using SPC with ambulation   PT Goals (current goals can now be found in the care plan section) Acute Rehab PT Goals Patient Stated Goal: Continue to get stronger PT Goal Formulation: With patient Time For Goal Achievement: 11/01/16 Potential to Achieve Goals: Good Progress towards PT goals: Progressing toward goals    Frequency    7X/week      PT Plan Current plan remains appropriate  Co-evaluation             End of Session Equipment Utilized During Treatment: Gait belt Activity Tolerance: Patient tolerated treatment well Patient left:  (pt left in care of OT)     Time: [id number] PT Time Calculation (min) (ACUTE ONLY): 24 min  Charges:  $Gait Training: 8-22  mins $Therapeutic Exercise: 8-22 mins                    G Codes:      Cassell Clement, PT, CSCS Pager [phone] Office [phone]  10/19/2016, 1:24 PM

## 2016-10-19 NOTE — Progress Notes (Signed)
Subjective: 1 Day Post-Op Procedure(s) (LRB): TOTAL HIP ARTHROPLASTY ANTERIOR APPROACH (Right) Patient reports pain as 4 on 0-10 scale.  Patient reports her pain has already improved vs pre op Denies nausea  Objective: Vital signs in last 24 hours: Temp:  [98.3 F (36.8 C)-99.5 F (37.5 C)] 99.5 F (37.5 C) (10/26 0455) Pulse Rate:  [86-98] 98 (10/26 0455) Resp:  [16-20] 20 (10/26 0455) BP: (111-133)/(61-74) 132/74 (10/26 0455) SpO2:  [95 %-99 %] 95 % (10/26 0455)  Intake/Output from previous day: 10/25 0701 - 10/26 0700 In: 3010 [P.O.:360; I.V.:2650] Out: 750 [Urine:150; Blood:600] Intake/Output this shift: No intake/output data recorded.   Recent Labs  10/19/16 0536  HGB 10.7*    Recent Labs  10/19/16 0536  WBC 10.5  RBC 3.71*  HCT 32.9*  PLT 172    Recent Labs  10/19/16 0536  NA 136  K 3.6  CL 103  CO2 24  BUN 12  CREATININE 0.82  GLUCOSE 114*  CALCIUM 8.1*   No results for input(s): LABPT, INR in the last 72 hours.  ABD soft Neurovascular intact Dorsiflexion/Plantar flexion intact dressing clean and dry  Assessment/Plan: 1 Day Post-Op Procedure(s) (LRB): TOTAL HIP ARTHROPLASTY ANTERIOR APPROACH (Right) Advance diet Up with therapy Plan for discharge tomorrow  BLAIR Alzena Gerber 10/19/2016, 2:28 PM

## 2016-10-19 NOTE — Discharge Instructions (Signed)

## 2016-10-19 NOTE — Discharge Summary (Signed)
Patient ID: [name] MRN: [id number] DOB/AGE: [birth year] 70 y.o.  Admit date: 10/18/2016 Discharge date: 10/19/2016  Admission Diagnoses:  Principal Problem:   S/P total hip arthroplasty   Discharge Diagnoses:  Same  Past Medical History:  Diagnosis Date  . Allergy    SEASONAL-flonase daily as well as Claritin  . Anemia    takes Ferrous Sulfate daily  . Anxiety    takes Celexa every evening  . Arthritis    BACK/KNEE  . Cataract    IMMATURE AND ON LEFT EYE SHE THINKS  . Chronic back pain   . Chronic back pain    L1 FRACTURE  . Depression    takes Wellbutrin daily  . Diabetes mellitus without complication ([place])    type II  . Dyspnea    with exertion  . Fibromyalgia   . GERD (gastroesophageal reflux disease)    takes Omeprazole daily and Zantac at bedtime  . H/O hiatal hernia   . History of colon polyps   . History of shingles   . Hyperlipidemia    takes Pravastatin daily as well as Questran  . Hypertension    takes Diovan daily  . Insomnia    takes trazodone nightly  . Joint pain   . Muscle spasm    takes Robaxin daily as needed  . Nausea    takes Zofran daily as needed  . Nocturia   . Numbness and tingling    LEGS  . Osteoporosis    takes fosamax  . Sleep apnea    CPAP-SLEEP STUDY DONE > 5 YRS AGO  . Thyroid disease    takes Synthroid daily    Surgeries: Procedure(s): TOTAL HIP ARTHROPLASTY ANTERIOR APPROACH on 10/18/2016   Consultants:   Discharged Condition: Improved  Hospital Course: [name] is an 70 y.o. female who was admitted 10/18/2016 for operative treatment ofS/P total hip arthroplasty. Patient has severe unremitting pain that affects sleep, daily activities, and work/hobbies. After pre-op clearance the patient was taken to the operating room on 10/18/2016 and underwent  Procedure(s): TOTAL HIP ARTHROPLASTY ANTERIOR APPROACH.  Hospital course uneventful; patient discharged to home after meeting PT goals for safe transfers and  ambulation.  Patient was given perioperative antibiotics: Anti-infectives    Start     Dose/Rate Route Frequency Ordered Stop   10/18/16 1500  clindamycin (CLEOCIN) IVPB 600 mg     600 mg 100 mL/hr over 30 Minutes Intravenous Every 6 hours 10/18/16 1358 10/18/16 2201   10/18/16 0900  clindamycin (CLEOCIN) IVPB 600 mg     600 mg 100 mL/hr over 30 Minutes Intravenous  Once 10/18/16 0849 10/18/16 0923   10/18/16 0850  clindamycin (CLEOCIN) 600 MG/50ML IVPB    Comments:  Ivar Drape   : cabinet override      10/18/16 0850 10/18/16 2059   10/18/16 0537  ceFAZolin (ANCEF) 2-4 GM/100ML-% IVPB  Status:  Discontinued    Comments:  Fabian Sharp   : cabinet override      10/18/16 0537 10/18/16 0548       Patient was given sequential compression devices, early ambulation, and chemoprophylaxis to prevent DVT.  Patient benefited maximally from hospital stay and there were no complications.    Recent vital signs: Patient Vitals for the past 24 hrs:  BP Temp Temp src Pulse Resp SpO2  10/19/16 1900 (!) 116/58 98.7 F (37.1 C) Oral 95 19 93 %  10/19/16 0455 132/74 99.5 F (37.5 C) Oral 98 20 95 %  10/19/16 0000 133/61 99.3 F (37.4 C) Oral 94 16 99 %  10/18/16 2030 111/64 98.3 F (36.8 C) Oral 86 16 99 %     Recent laboratory studies:  Recent Labs  10/19/16 0536  WBC 10.5  HGB 10.7*  HCT 32.9*  PLT 172  NA 136  K 3.6  CL 103  CO2 24  BUN 12  CREATININE 0.82  GLUCOSE 114*  CALCIUM 8.1*     Discharge Medications:     Medication List    STOP taking these medications   aspirin 81 MG tablet Replaced by:  aspirin 325 MG EC tablet   HYDROcodone-acetaminophen 5-325 MG tablet Commonly known as:  NORCO/VICODIN     TAKE these medications   alendronate 70 MG tablet Commonly known as:  FOSAMAX Take 70 mg by mouth once a week. Take with a full glass of water on an empty stomach.   aspirin 325 MG EC tablet Take 1 tablet (325 mg total) by mouth daily with  breakfast. Start taking on:  10/20/2016 Replaces:  aspirin 81 MG tablet   Biotin 1000 MCG tablet Take 1,000 mcg by mouth daily.   buPROPion 150 MG 12 hr tablet Commonly known as:  WELLBUTRIN SR Take 150 mg by mouth daily.   CALCIUM 1200 1200-1000 MG-UNIT Chew Chew 1 tablet by mouth daily.   citalopram 40 MG tablet Commonly known as:  CELEXA Take 40 mg by mouth every evening.   diphenoxylate-atropine 2.5-0.025 MG tablet Commonly known as:  LOMOTIL TAKE 1 TABLET BY MOUTH AFTER EACH LOOSE BOWEL MOVEMENT AS NEEDED... UP TO 8 TABLETS A DAY   docusate sodium 100 MG capsule Commonly known as:  COLACE Take 1 capsule (100 mg total) by mouth 2 (two) times daily. While taking narcotic medicine for pain   Fish Oil 1000 MG Caps Take 1,000 mg by mouth daily.   fluticasone 50 MCG/ACT nasal spray Commonly known as:  FLONASE Place 2 sprays into the nose daily.   levothyroxine 112 MCG tablet Commonly known as:  SYNTHROID, LEVOTHROID Take 112 mcg by mouth daily before breakfast.   loratadine 10 MG tablet Commonly known as:  CLARITIN Take 10 mg by mouth daily.   metFORMIN 500 MG 24 hr tablet Commonly known as:  GLUCOPHAGE-XR Take 500 mg by mouth every evening.   methocarbamol 500 MG tablet Commonly known as:  ROBAXIN Take 1 tablet (500 mg total) by mouth every 6 (six) hours as needed for muscle spasms.   omeprazole 40 MG capsule Commonly known as:  PRILOSEC Take 1 capsule (40 mg total) by mouth daily before breakfast. What changed:  when to take this  reasons to take this   ondansetron 4 MG tablet Commonly known as:  ZOFRAN Take 1 tablet (4 mg total) by mouth every 6 (six) hours as needed for nausea.   oxyCODONE-acetaminophen 5-325 MG tablet Commonly known as:  PERCOCET/ROXICET Take 1 tablet by mouth every 4 (four) hours as needed for moderate pain or severe pain.   potassium chloride SA 20 MEQ tablet Commonly known as:  K-DUR,KLOR-CON Take 20 mEq by mouth 2 (two)  times daily.   pravastatin 20 MG tablet Commonly known as:  PRAVACHOL Take 20 mg by mouth at bedtime.   traZODone 50 MG tablet Commonly known as:  DESYREL Take 50 mg by mouth at bedtime.   triamcinolone cream 0.1 % Commonly known as:  KENALOG Apply 1 application topically daily as needed (rash).   valsartan-hydrochlorothiazide 80-12.5 MG tablet Commonly known as:  DIOVAN-HCT Take 1 tablet by mouth daily.   VITAMIN B-12 PO Take 1 tablet by mouth daily.       Diagnostic Studies: Dg C-arm 1-60 Min  Result Date: 10/18/2016 CLINICAL DATA:  Hip pain. EXAM: DG C-ARM 61-120 MIN; OPERATIVE RIGHT HIP WITH PELVIS COMPARISON:  None. FINDINGS: Status post anterior THA. Satisfactory position alignment of the acetabular and femoral components. IMPRESSION: As above. Electronically Signed   By: Staci Righter M.D.   On: 10/18/2016 10:31   Dg Hip Operative Unilat W Or W/o Pelvis Right  Result Date: 10/18/2016 CLINICAL DATA:  Hip pain. EXAM: DG C-ARM 61-120 MIN; OPERATIVE RIGHT HIP WITH PELVIS COMPARISON:  None. FINDINGS: Status post anterior THA. Satisfactory position alignment of the acetabular and femoral components. IMPRESSION: As above. Electronically Signed   By: Staci Righter M.D.   On: 10/18/2016 10:31    Disposition: 01-Home or Self Care  Discharge Instructions    Call MD / Call 911    Complete by:  As directed    If you experience chest pain or shortness of breath, CALL 911 and be transported to the hospital emergency room.  If you develope a fever above 101 F, pus (white drainage) or increased drainage or redness at the wound, or calf pain, call your surgeon's office.   Constipation Prevention    Complete by:  As directed    Drink plenty of fluids.  Prune juice and/or coffee may be helpful.  You may use a stool softener, such as Colace (over the counter) 100 mg twice a day.  Use MiraLax (over the counter) for constipation as needed but this may take several days to work.  Mag  Citrate --OR-- Milk of Magnesia may also be used but follow directions on the label.   Diet - low sodium heart healthy    Complete by:  As directed    Discharge instructions    Complete by:  As directed    INSTRUCTIONS AFTER JOINT REPLACEMENT   Remove items at home which could result in a fall. This includes throw rugs or furniture in walking pathways ICE to the affected joint every three hours while awake for 30 minutes at a time, for at least the first 3-5 days, and then as needed for pain and swelling.  Continue to use ice for pain and swelling. You may notice swelling that will progress down to the foot and ankle.  This is normal after surgery.  Elevate your leg when you are not up walking on it.   Continue to use the breathing machine you got in the hospital (incentive spirometer) which will help keep your temperature down.  It is common for your temperature to cycle up and down following surgery, especially at night when you are not up moving around and exerting yourself.  The breathing machine keeps your lungs expanded and your temperature down.   DIET:  As you were doing prior to hospitalization, we recommend a well-balanced diet.  DRESSING / WOUND CARE / SHOWERING  Keep the surgical dressing until follow up.  The dressing is water proof, so you can shower without any extra covering.  IF THE DRESSING FALLS OFF or the wound gets wet inside, change the dressing with sterile gauze.  Please use good hand washing techniques before changing the dressing.  Do not use any lotions or creams on the incision until instructed by your surgeon.    ACTIVITY  Increase activity slowly as tolerated, but follow the weight bearing instructions below.  No driving for 6 weeks or until further direction given by your physician.  You cannot drive while taking narcotics.  No lifting or carrying greater than 10 lbs. until further directed by your surgeon. Avoid periods of inactivity such as sitting longer than  an hour when not asleep. This helps prevent blood clots.  You may return to work once you are authorized by your doctor.     WEIGHT BEARING   Weight bearing as tolerated with assist device (walker, cane, etc) as directed, use it as long as suggested by your surgeon or therapist, typically at least 4-6 weeks.   EXERCISES  Results after joint replacement surgery are often greatly improved when you follow the exercise, range of motion and muscle strengthening exercises prescribed by your doctor. Safety measures are also important to protect the joint from further injury. Any time any of these exercises cause you to have increased pain or swelling, decrease what you are doing until you are comfortable again and then slowly increase them. If you have problems or questions, call your caregiver or physical therapist for advice.   Rehabilitation is important following a joint replacement. After just a few days of immobilization, the muscles of the leg can become weakened and shrink (atrophy).  These exercises are designed to build up the tone and strength of the thigh and leg muscles and to improve motion. Often times heat used for twenty to thirty minutes before working out will loosen up your tissues and help with improving the range of motion but do not use heat for the first two weeks following surgery (sometimes heat can increase post-operative swelling).   These exercises can be done on a training (exercise) mat, on the floor, on a table or on a bed. Use whatever works the best and is most comfortable for you.    Use music or television while you are exercising so that the exercises are a pleasant break in your day. This will make your life better with the exercises acting as a break in your routine that you can look forward to.   Perform all exercises about fifteen times, three times per day or as directed.  You should exercise both the operative leg and the other leg as well.   Exercises include:    Quad Sets - Tighten up the muscle on the front of the thigh (Quad) and hold for 5-10 seconds.   Straight Leg Raises - With your knee straight (if you were given a brace, keep it on), lift the leg to 60 degrees, hold for 3 seconds, and slowly lower the leg.  Perform this exercise against resistance later as your leg gets stronger.  Leg Slides: Lying on your back, slowly slide your foot toward your buttocks, bending your knee up off the floor (only go as far as is comfortable). Then slowly slide your foot back down until your leg is flat on the floor again.  Angel Wings: Lying on your back spread your legs to the side as far apart as you can without causing discomfort.  Hamstring Strength:  Lying on your back, push your heel against the floor with your leg straight by tightening up the muscles of your buttocks.  Repeat, but this time bend your knee to a comfortable angle, and push your heel against the floor.  You may put a pillow under the heel to make it more comfortable if necessary.   A rehabilitation program following joint replacement surgery can speed recovery and prevent re-injury  in the future due to weakened muscles. Contact your doctor or a physical therapist for more information on knee rehabilitation.    CONSTIPATION  Constipation is defined medically as fewer than three stools per week and severe constipation as less than one stool per week.  Even if you have a regular bowel pattern at home, your normal regimen is likely to be disrupted due to multiple reasons following surgery.  Combination of anesthesia, postoperative narcotics, change in appetite and fluid intake all can affect your bowels.   YOU MUST use at least one of the following options; they are listed in order of increasing strength to get the job done.  They are all available over the counter, and you may need to use some, POSSIBLY even all of these options:    Drink plenty of fluids (prune juice may be helpful) and high  fiber foods Colace 100 mg by mouth twice a day  Senokot for constipation as directed and as needed Dulcolax (bisacodyl), take with full glass of water  Miralax (polyethylene glycol) once or twice a day as needed.  If you have tried all these things and are unable to have a bowel movement in the first 3-4 days after surgery call either your surgeon or your primary doctor.    If you experience loose stools or diarrhea, hold the medications until you stool forms back up.  If your symptoms do not get better within 1 week or if they get worse, check with your doctor.  If you experience "the worst abdominal pain ever" or develop nausea or vomiting, please contact the office immediately for further recommendations for treatment.   ITCHING:  If you experience itching with your medications, try taking only a single pain pill, or even half a pain pill at a time.  You can also use Benadryl over the counter for itching or also to help with sleep.   TED HOSE STOCKINGS:  Use stockings on both legs until for at least 2 weeks or as directed by physician office. They may be removed at night for sleeping.  MEDICATIONS:  See your medication summary on the "After Visit Summary" that nursing will review with you.  You may have some home medications which will be placed on hold until you complete the course of blood thinner medication.  It is important for you to complete the blood thinner medication as prescribed.  PRECAUTIONS:  If you experience chest pain or shortness of breath - call 911 immediately for transfer to the hospital emergency department.   If you develop a fever greater that 101 F, purulent drainage from wound, increased redness or drainage from wound, foul odor from the wound/dressing, or calf pain - CONTACT YOUR SURGEON.                                                   FOLLOW-UP APPOINTMENTS:  If you do not already have a post-op appointment, please call the office for an appointment to be seen by  your surgeon.  Guidelines for how soon to be seen are listed in your "After Visit Summary", but are typically between 1-4 weeks after surgery.  OTHER INSTRUCTIONS:   Knee Replacement:  Do not place pillow under knee, focus on keeping the knee straight while resting. CPM instructions: 0-90 degrees, 2 hours in the morning, 2 hours in the  afternoon, and 2 hours in the evening. Place foam block, curve side up under heel at all times except when in CPM or when walking.  DO NOT modify, tear, cut, or change the foam block in any way.  MAKE SURE YOU:  Understand these instructions.  Get help right away if you are not doing well or get worse.    Thank you for letting us be a part of your medical care team.  It is a privilege we respect greatly.  We hope these instructions will help you stay on track for a fast and full recovery!   Follow the hip precautions as taught in Physical Therapy    Complete by:  As directed    Increase activity slowly as tolerated    Complete by:  As directed    Patient may shower    Complete by:  As directed    You may shower over the brown dressing.  Once the dressing is removed you may shower without a dressing once there is no drainage.  Do not wash over the wound.  If drainage remains, cover wound with plastic wrap and then shower   TED hose    Complete by:  As directed    Use stockings (TED hose) for two weeks on both leg(s).  You may remove them at night for sleeping.      Follow-up Information    [facility] .   Why:  Someone from [place] [facility] [employer] ), will contact you to arrange start date and time for therapy.  Contact information: [address] [place] [address] [phone]        Ninetta Lights, MD Follow up in 2 week(s).   Specialty:  Orthopedic Surgery Contact information: [place] [address] [phone]            Signed: Nehemiah Massed 10/19/2016, 7:33 PM

## 2016-10-19 NOTE — Evaluation (Signed)
Occupational Therapy Evaluation Patient Details Name: MEDEA GURTLER MRN: [id number] DOB: Jan 13, 1946 Today's Date: 10/19/2016    History of Present Illness Pt is a 70 y.o. female now s/p Rt direct anterior THA. PMH: osteoporosis, HTN, fibromyalgia, diabetes, chronic back pain, anxiety.    Clinical Impression   PTA Pt independent in ADL and mod I mobility with SPC. Pt currently mod assist for LB ADL, and min guard for mobility with RW. Pt with deficits in self care tasks (see list below). Pt will benefit from skilled OT in the acute care setting prior to d/c home with husband to maximize independence in ADL. Next session to practice tub transfer with shower seat.     Follow Up Recommendations  No OT follow up;Supervision - Intermittent    Equipment Recommendations  None recommended by OT (Pt has all appropriate DME)    Recommendations for Other Services       Precautions / Restrictions Precautions Precautions: Fall Restrictions Weight Bearing Restrictions: Yes RLE Weight Bearing: Weight bearing as tolerated      Mobility Bed Mobility               General bed mobility comments: Pt walking in room with PT when OT session started  Transfers Overall transfer level: Needs assistance Equipment used: Rolling walker (2 wheeled) Transfers: Sit to/from Stand Sit to Stand: Min guard         General transfer comment: reminder for hand placement    Balance Overall balance assessment: Needs assistance Sitting-balance support: No upper extremity supported;Feet supported Sitting balance-Leahy Scale: Good     Standing balance support: No upper extremity supported;During functional activity Standing balance-Leahy Scale: Fair Standing balance comment: standing at sink for ADL                            ADL Overall ADL's : Needs assistance/impaired Eating/Feeding: Modified independent;Sitting   Grooming: Wash/dry hands;Wash/dry face;Min  guard;Standing Grooming Details (indicate cue type and reason): sink level     Lower Body Bathing: Minimal assistance;Sit to/from stand Lower Body Bathing Details (indicate cue type and reason): educated on LB bathing with long handle sponge Upper Body Dressing : Set up;Sitting   Lower Body Dressing: Moderate assistance;Sit to/from stand Lower Body Dressing Details (indicate cue type and reason): Pt is planning on getting assistance from her husband Toilet Transfer: Min guard;BSC;RW (3 in 1 over toilet)   Toileting- Clothing Manipulation and Hygiene: Minimal assistance;Sit to/from stand Toileting - Clothing Manipulation Details (indicate cue type and reason): assist with hospital gown so Pt could use hands to get on the 3 in 1. Pt also will require mod assist for wiping after BM.     Functional mobility during ADLs: Min guard;Rolling walker General ADL Comments: Pt requires increased time.     Vision     Perception     Praxis      Pertinent Vitals/Pain Pain Assessment: 0-10 Pain Score: 4  Pain Location: Right Hip Pain Descriptors / Indicators: Sore;Grimacing Pain Intervention(s): Monitored during session;Repositioned;Ice applied     Hand Dominance Right   Extremity/Trunk Assessment Upper Extremity Assessment Upper Extremity Assessment: Overall WFL for tasks assessed   Lower Extremity Assessment Lower Extremity Assessment: RLE deficits/detail;Defer to PT evaluation   Cervical / Trunk Assessment Cervical / Trunk Assessment: Normal   Communication Communication Communication: No difficulties   Cognition Arousal/Alertness: Awake/alert Behavior During Therapy: WFL for tasks assessed/performed Overall Cognitive Status: Within Functional Limits for  tasks assessed                     General Comments       Exercises      Shoulder Instructions      Home Living Family/patient expects to be discharged to:: Private residence Living Arrangements:  Spouse/significant other Available Help at Discharge: Family;Available 24 hours/day Type of Home: House Home Access: Ramped entrance     Home Layout: One level     Bathroom Shower/Tub: Tub/shower unit Shower/tub characteristics: Architectural technologist: Standard Bathroom Accessibility: Yes How Accessible: Accessible via walker Home Equipment: [place] - 2 wheels;Cane - single point;Shower seat;Grab bars - tub/shower   Additional Comments: husband will be with pt at D/C      Prior Functioning/Environment Level of Independence: Independent with assistive device(s)        Comments: using SPC with ambulation        OT Problem List: Decreased range of motion;Decreased strength;Impaired balance (sitting and/or standing);Decreased knowledge of use of DME or AE;Obesity;Pain   OT Treatment/Interventions: Self-care/ADL training;DME and/or AE instruction;Therapeutic activities;Patient/family education;Balance training    OT Goals(Current goals can be found in the care plan section) Acute Rehab OT Goals Patient Stated Goal: Continue to get stronger OT Goal Formulation: With patient/family Time For Goal Achievement: 10/24/16 Potential to Achieve Goals: Good ADL Goals Pt Will Perform Grooming: with modified independence;standing Pt Will Transfer to Toilet: with supervision;ambulating;bedside commode Pt Will Perform Toileting - Clothing Manipulation and hygiene: with modified independence;sit to/from stand Pt Will Perform Tub/Shower Transfer: with min guard assist;with caregiver independent in assisting;rolling walker;ambulating;shower seat  OT Frequency: Min 2X/week   Barriers to D/C:            Co-evaluation              End of Session Equipment Utilized During Treatment: Gait belt;Rolling walker Nurse Communication: Mobility status  Activity Tolerance: Patient tolerated treatment well Patient left: in chair;with call bell/phone within reach;with family/visitor present    Time: 1215-1242 OT Time Calculation (min): 27 min Charges:  OT General Charges $OT Visit: 1 Procedure OT Evaluation $OT Eval Low Complexity: 1 Procedure OT Treatments $Self Care/Home Management : 8-22 mins G-Codes:    Merri Ray Lycan Davee 10-24-2016, 1:36 PM  Hulda Humphrey OTR/L [phone]

## 2016-10-19 NOTE — Op Note (Signed)
NAMEALEXANA, PHANOR NO.:  [id number]  MEDICAL RECORD NO.:  [id number]  LOCATION:  [id number]                        FACILITY:  [facility]  PHYSICIAN:  Ninetta Lights, M.D. DATE OF BIRTH:  Jun 09, 1946  DATE OF PROCEDURE:  10/18/2016 DATE OF DISCHARGE:                              OPERATIVE REPORT   PREOPERATIVE DIAGNOSIS:  Right hip end-stage arthritis, primary localized.  POSTOPERATIVE DIAGNOSES: 1. Right hip end-stage arthritis, primary localized. 2. Morbid obesity, short stature.  PROCEDURES:  Direct anterior right total hip replacement utilizing Stryker prosthesis.  Press-fit 46 mm acetabular component screw fixation x2.  A 36 mm internal diameter liner.  A press-fit #4 Accolade stem.  36 -5 Biolox head.  SURGEON:  Ninetta Lights, M.D.  ASSISTANT:  Lowell Guitar. Mercie Eon., present throughout the entire case and necessary for timely completion of procedure.  ANESTHESIA:  General.  BLOOD LOSS:  300 mL.  BLOOD GIVEN:  None.  SPECIMENS:  None.  CULTURES:  None.  COMPLICATIONS:  None.  DRESSINGS:  Soft compressive.  DESCRIPTION OF PROCEDURE:  The patient was brought to the operating room.  After adequate anesthesia had been obtained, placed on the Hana table.  Prepped and draped in usual sterile fashion.  Positioning approach in all aspects of procedure much more difficult by her short stature under 5 feet tall and morbid obesity.  Longitudinal incision anteriorly.  Skin and subcutaneous tissue divided.  Hemostasis with cautery.  Tied the fascia over the tensor.  Tensor incised.  That was retracted anteriorly.  Retractors put in place.  Deep fascia and capsule removed.  Femoral neck cut 1 fingerbreadth above the lesser trochanter. Acetabulum exposed.  Brought up to good bleeding bone.  Fitted with a 46 mm cup at 40 degrees of abduction, slight anteversion.  Confirmed good position with fluoroscopy.  Good capturing and fixation augmented with 2 screws  through the cup.  A 36 mm internal diameter liner.  The femur was freed up using traction and positioning with Hana table.  Broached and then fitted with a #4 stem.  After appropriate trials, I seated a #4 stem with a 36 -5 Biolox head.  This gave me solid and stable fixation, equal leg lengths.  Again, confirmed with fluoroscopy.  Wound irrigated, did inject with Exparel.  Fascia closed with Vicryl.  Subcutaneous and subcuticular closure.  Margins were injected with Marcaine.  Sterile compressive dressing applied.  Anesthesia reversed.  Brought to the recovery room.  Tolerated the surgery well.  No complications.     Ninetta Lights, M.D.     DFM/MEDQ  D:  10/18/2016  T:  10/19/2016  Job:  [id number]

## 2016-10-19 NOTE — Anesthesia Postprocedure Evaluation (Signed)
Anesthesia Post Note  Patient: [name]  Procedure(s) Performed: Procedure(s) (LRB): TOTAL HIP ARTHROPLASTY ANTERIOR APPROACH (Right)  Patient location during evaluation: PACU Anesthesia Type: General Level of consciousness: awake and alert Pain management: pain level controlled Vital Signs Assessment: post-procedure vital signs reviewed and stable Respiratory status: spontaneous breathing, nonlabored ventilation, respiratory function stable and patient connected to nasal cannula oxygen Cardiovascular status: blood pressure returned to baseline and stable Postop Assessment: no signs of nausea or vomiting Anesthetic complications: no    Last Vitals:  Vitals:   10/19/16 0000 10/19/16 0455  BP: 133/61 132/74  Pulse: 94 98  Resp: 16 20  Temp: 37.4 C 37.5 C    Last Pain:  Vitals:   10/19/16 1051  TempSrc:   PainSc: [place]

## 2016-10-19 NOTE — Progress Notes (Signed)
Pt states she does not need our CPAP machine and only wears oxygen while she is here but does wear her CPAP at home. Pt encouraged to bring her own CPAP machine if she prefers to wear that.

## 2016-10-19 NOTE — Consult Note (Signed)
[facility] Primary Care Navigator  10/19/2016  [name] 10-13-1946 [id number]   Met with patient and husband [name]) at the bedside to identify discharge needs. Patient verbalized having increased/worsening pain to right hip radiating to groin with limitation of movements that led to this admission/ surgery.  Patient confirmed Dr. Joyce Copa with [facility] as the primary care provider.   Patient shared using [facility] Linna Hoff) to obtain medications with no problem. Patient manages her own medications at home using the "pill box" system.  Husband will provide transportation to her doctors' appointments after discharge. Patient's husband and daughter [name]) will be the primary caregivers at home.  PT is anticipating that patient will progress with her mobility and discharge to home.  Patient had been set-up for home health services with [facility] per husband.  Patient and husband expressed understanding to call primary care provider's office once discharged, for a post discharge follow-up appointment within a week or sooner if needs arise. Patient letter provided as a reminder.  Patient and husband denied any other needs or concerns at this time.    For additional questions please contact:  Edwena Felty A. Haim Hansson, BSN, RN-BC [facility] PRIMARY CARE Navigator Cell: [phone]

## 2016-10-19 NOTE — Care Management Note (Signed)
Case Management Note  Patient Details  Name: [name] MRN: [id number] Date of Birth: [birth year]  Subjective/Objective:  70 yr old female s/p right total hip arthroplasty.                  Action/Plan: Patient was preoperatively setup with [facility], no changes. She has rolling walker, 3in1 , shower seat and a cane. Will have family support at discharge.   Expected Discharge Date:   10/20/16               Expected Discharge Plan:  [place]  In-House Referral:     Discharge planning Services  CM Consult  Post Acute Care Choice:  Home Health Choice offered to:  Patient  DME Arranged:  N/A DME Agency:  NA  HH Arranged:  PT [place] Agency:  [facility] (now [facility])  Status of Service:  Completed, signed off  If discussed at [employer] of Stay Meetings, dates discussed:    Additional Comments:  Ninfa Meeker, RN 10/19/2016, 2:41 PM

## 2016-10-19 NOTE — Progress Notes (Signed)
Physical Therapy Treatment Patient Details Name: [name] MRN: [id number] DOB: 05-05-1946 Today's Date: 10/19/2016    History of Present Illness Pt is a 70 y.o. female now s/p Rt direct anterior THA. PMH: osteoporosis, HTN, fibromyalgia, diabetes, chronic back pain, anxiety.     PT Comments    Pt continuing to make progress, able to increase ambulation distance to 75 feet. Pt continues with slow but stable pattern. Spouse present throughout session. Anticipating that the pt will D/C to home following acute stay.   Follow Up Recommendations  Home health PT;Supervision for mobility/OOB     Equipment Recommendations  None recommended by PT    Recommendations for Other Services       Precautions / Restrictions Precautions Precautions: Fall Restrictions Weight Bearing Restrictions: Yes RLE Weight Bearing: Weight bearing as tolerated    Mobility  Bed Mobility Overal bed mobility: Needs Assistance Bed Mobility: Sit to Supine       Sit to supine: Mod assist   General bed mobility comments: mod assist with Rt LE  Transfers Overall transfer level: Needs assistance Equipment used: Rolling walker (2 wheeled) Transfers: Sit to/from Stand Sit to Stand: Min guard         General transfer comment: transfers performed from chair and [facility]  Ambulation/Gait Ambulation/Gait assistance: Supervision;Min guard Ambulation Distance (Feet): 75 Feet (plus 12 ft prior to bathroom) Assistive device: Rolling walker (2 wheeled) Gait Pattern/deviations: Step-to pattern;Step-through pattern Gait velocity: decreased   General Gait Details: working on step through pattern. Pt initailly step-to pattern but improving to step through over time.    Stairs            Wheelchair Mobility    Modified Rankin (Stroke Patients Only)       Balance Overall balance assessment: Needs assistance Sitting-balance support: No upper extremity supported Sitting balance-Leahy Scale: Good      Standing balance support: No upper extremity supported Standing balance-Leahy Scale: Fair Standing balance comment: able to stand static without UE support                    Cognition Arousal/Alertness: Awake/alert Behavior During Therapy: WFL for tasks assessed/performed Overall Cognitive Status: Within Functional Limits for tasks assessed                      Exercises     General Comments        Pertinent Vitals/Pain Pain Assessment: 0-10 Pain Score: 4  Pain Location: Rt hip Pain Descriptors / Indicators: Sore Pain Intervention(s): Limited activity within patient's tolerance;Monitored during session    Home Living    Prior Function Level of Independence: Independent with assistive device(s)      Comments: using SPC with ambulation   PT Goals (current goals can now be found in the care plan section) Acute Rehab PT Goals Patient Stated Goal: Continue to get stronger PT Goal Formulation: With patient Time For Goal Achievement: 11/01/16 Potential to Achieve Goals: Good Progress towards PT goals: Progressing toward goals    Frequency    7X/week      PT Plan Current plan remains appropriate    Co-evaluation             End of Session Equipment Utilized During Treatment: Gait belt Activity Tolerance: Patient tolerated treatment well Patient left: in bed;with call bell/phone within reach;with family/visitor present;with SCD's reapplied     Time: [id number] PT Time Calculation (min) (ACUTE ONLY): 44 min  Charges:  $Gait Training:  23-37 mins $Therapeutic Activity: 8-22 mins                    G Codes:      Cassell Clement, PT, CSCS Pager [phone] Office [phone]  10/19/2016, 4:08 PM

## 2016-10-20 LAB — GLUCOSE, CAPILLARY
Glucose-Capillary: 123 mg/dL — ABNORMAL HIGH (ref 65–99)
Glucose-Capillary: 144 mg/dL — ABNORMAL HIGH (ref 65–99)

## 2016-10-20 LAB — BASIC METABOLIC PANEL
Anion gap: 8 (ref 5–15)
BUN: 17 mg/dL (ref 6–20)
CO2: 23 mmol/L (ref 22–32)
Calcium: 8.5 mg/dL — ABNORMAL LOW (ref 8.9–10.3)
Chloride: 104 mmol/L (ref 101–111)
Creatinine, Ser: 0.76 mg/dL (ref 0.44–1.00)
GFR calc Af Amer: >60 mL/min (ref >60)
GFR calc non Af Amer: >60 mL/min (ref >60)
Glucose, Bld: 121 mg/dL — ABNORMAL HIGH (ref 65–99)
Potassium: 4 mmol/L (ref 3.5–5.1)
Sodium: 135 mmol/L (ref 135–145)

## 2016-10-20 LAB — CBC
HCT: 29.9 % — ABNORMAL LOW (ref 36.0–46.0)
Hemoglobin: 9.6 g/dL — ABNORMAL LOW (ref 12.0–15.0)
MCH: 28.4 pg (ref 26.0–34.0)
MCHC: 32.1 g/dL (ref 30.0–36.0)
MCV: 88.5 fL (ref 78.0–100.0)
Platelets: 147 10*3/uL — ABNORMAL LOW (ref 150–400)
RBC: 3.38 MIL/uL — ABNORMAL LOW (ref 3.87–5.11)
RDW: 13.4 % (ref 11.5–15.5)
WBC: 12.2 10*3/uL — ABNORMAL HIGH (ref 4.0–10.5)

## 2016-10-20 NOTE — Progress Notes (Signed)
Subjective: 2 Days Post-Op Procedure(s) (LRB): TOTAL HIP ARTHROPLASTY ANTERIOR APPROACH (Right) Patient reports pain as mild and moderate.    Objective: Vital signs in last 24 hours: Temp:  [98.6 F (37 C)-98.8 F (37.1 C)] 98.6 F (37 C) (10/27 0526) Pulse Rate:  [95-98] 97 (10/27 0526) Resp:  [17-19] 18 (10/27 0526) BP: (116-124)/(49-58) 124/49 (10/27 0526) SpO2:  [93 %-97 %] 97 % (10/27 0526)  Intake/Output from previous day: 10/26 0701 - 10/27 0700 In: 2172.5 [P.O.:960; I.V.:1212.5] Out: -  Intake/Output this shift: No intake/output data recorded.   Recent Labs  10/19/16 0536 10/20/16 0717  HGB 10.7* 9.6*    Recent Labs  10/19/16 0536 10/20/16 0717  WBC 10.5 12.2*  RBC 3.71* 3.38*  HCT 32.9* 29.9*  PLT 172 147*    Recent Labs  10/19/16 0536 10/20/16 0717  NA 136 135  K 3.6 4.0  CL 103 104  CO2 24 23  BUN 12 17  CREATININE 0.82 0.76  GLUCOSE 114* 121*  CALCIUM 8.1* 8.5*   No results for input(s): LABPT, INR in the last 72 hours.  Neurovascular intact Sensation intact distally Intact pulses distally Dorsiflexion/Plantar flexion intact Incision: dressing C/D/I  Assessment/Plan: 2 Days Post-Op Procedure(s) (LRB): TOTAL HIP ARTHROPLASTY ANTERIOR APPROACH (Right) Discharge home with home health  [name] 10/20/2016, 8:25 AM

## 2016-10-20 NOTE — Progress Notes (Signed)
Patient for discharge. No acute distress noted. Medications and discharge instructions explained to the patient by Buck Mam, RN. IV saline lock removed

## 2016-10-20 NOTE — Progress Notes (Signed)
Occupational Therapy Treatment Patient Details Name: [name] MRN: [id number] DOB: Jun 01, 1946 Today's Date: 10/20/2016    History of present illness Pt is a 70 y.o. female now s/p Rt direct anterior THA. PMH: osteoporosis, HTN, fibromyalgia, diabetes, chronic back pain, anxiety.    OT comments  Pt making progress towards goals. During today's session the focus was to be able to perform tub transfer, reviewed AE as way to maximize independence. Pt with no further questions or concerns for OT prior to d/c home with supervision from husband. Pt at adequate level for discharge from OT perspective.  Follow Up Recommendations  No OT follow up;Supervision - Intermittent    Equipment Recommendations  None recommended by OT    Recommendations for Other Services      Precautions / Restrictions Precautions Precautions: Fall Restrictions Weight Bearing Restrictions: Yes RLE Weight Bearing: Weight bearing as tolerated       Mobility Bed Mobility Overal bed mobility: Needs Assistance Bed Mobility: Supine to Sit     Supine to sit: Min assist     General bed mobility comments: min assist with RLE and verbal cues for sequencing. HOB flat  Transfers Overall transfer level: Needs assistance Equipment used: Rolling walker (2 wheeled) Transfers: Sit to/from Stand Sit to Stand: Supervision         General transfer comment: good technique from chair and 3 in 1 in tub.     Balance Overall balance assessment: Needs assistance Sitting-balance support: No upper extremity supported;Feet supported Sitting balance-Leahy Scale: Good     Standing balance support: No upper extremity supported;During functional activity Standing balance-Leahy Scale: Fair Standing balance comment: practicing tub transfer                   ADL               Lower Body Bathing: Minimal assistance;Sit to/from stand Lower Body Bathing Details (indicate cue type and reason): reinforced  education on LB bathing with long handle sponge         Toilet Transfer: Min guard;Ambulation;RW   Toileting- Clothing Manipulation and Hygiene: Minimal assistance;Sit to/from stand       Functional mobility during ADLs: Min guard;Rolling walker General ADL Comments: Pt requires increased time.      Vision                     Perception     Praxis      Cognition   Behavior During Therapy: WFL for tasks assessed/performed Overall Cognitive Status: Within Functional Limits for tasks assessed                       Extremity/Trunk Assessment               Exercises     Shoulder Instructions       General Comments      Pertinent Vitals/ Pain       Pain Assessment: 0-10 Pain Score: 3  Pain Location: Right Thigh Pain Descriptors / Indicators: Grimacing;Sore Pain Intervention(s): Monitored during session;Repositioned  Home Living                                          Prior Functioning/Environment              Frequency  Min 2X/week  Progress Toward Goals  OT Goals(current goals can now be found in the care plan section)  Progress towards OT goals: Progressing toward goals  Acute Rehab OT Goals Patient Stated Goal: get home, be independent OT Goal Formulation: With patient Time For Goal Achievement: 10/24/16 Potential to Achieve Goals: Good  Plan Discharge plan remains appropriate    Co-evaluation                 End of Session Equipment Utilized During Treatment: Gait belt;Rolling walker   Activity Tolerance Patient tolerated treatment well   Patient Left in chair;Other (comment) (in ortho gym with PT)   Nurse Communication Other (comment) (with PT)        [id number] OT Time Calculation (min): 30 min  Charges: OT Treatments $Self Care/Home Management : 23-37 mins  [name] 10/20/2016, 11:27 AM  [name] OTR/L [phone]

## 2016-10-20 NOTE — Progress Notes (Signed)
Physical Therapy Treatment Patient Details Name: KENJI BIDDULPH MRN: [id number] DOB: April 21, 1946 Today's Date: 10/20/2016    History of Present Illness Pt is a 70 y.o. female now s/p Rt direct anterior THA. PMH: osteoporosis, HTN, fibromyalgia, diabetes, chronic back pain, anxiety.     PT Comments    Patient is making good progress with PT with session focused on gait and standing balance with functional tasks. From a mobility standpoint anticipate patient will be ready for DC home with spouse to assist. Pt denies any questions or concerns following PT session.      Follow Up Recommendations  Home health PT;Supervision for mobility/OOB     Equipment Recommendations  None recommended by PT    Recommendations for Other Services       Precautions / Restrictions Precautions Precautions: Fall Restrictions Weight Bearing Restrictions: Yes RLE Weight Bearing: Weight bearing as tolerated    Mobility  Bed Mobility                  Transfers Overall transfer level: Needs assistance Equipment used: Rolling walker (2 wheeled) Transfers: Sit to/from Stand Sit to Stand: Supervision         General transfer comment: good technique from chair and BSC.   Ambulation/Gait Ambulation/Gait assistance: Supervision Ambulation Distance (Feet): 150 Feet Assistive device: Rolling walker (2 wheeled) Gait Pattern/deviations: Step-through pattern Gait velocity: decreased   General Gait Details: cues for even strides and weightbearing   Stairs            Wheelchair Mobility    Modified Rankin (Stroke Patients Only)       Balance Overall balance assessment: Needs assistance Sitting-balance support: No upper extremity supported Sitting balance-Leahy Scale: Good     Standing balance support: During functional activity Standing balance-Leahy Scale: Fair Standing balance comment: able to stand static without UE support.                     Cognition  Arousal/Alertness: Awake/alert Behavior During Therapy: WFL for tasks assessed/performed Overall Cognitive Status: Within Functional Limits for tasks assessed                      Exercises      General Comments General comments (skin integrity, edema, etc.): Working on standing balance during functional activities with and without UE support.       Pertinent Vitals/Pain Pain Assessment: 0-10 Pain Score: 3  Pain Location: Rt lateral thigh Pain Descriptors / Indicators: Burning Pain Intervention(s): Limited activity within patient's tolerance;Monitored during session    Home Living                      Prior Function            PT Goals (current goals can now be found in the care plan section) Acute Rehab PT Goals Patient Stated Goal: get home, be independent PT Goal Formulation: With patient Time For Goal Achievement: 11/01/16 Potential to Achieve Goals: Good Progress towards PT goals: Progressing toward goals    Frequency    7X/week      PT Plan Current plan remains appropriate    Co-evaluation             End of Session Equipment Utilized During Treatment: Gait belt Activity Tolerance: Patient tolerated treatment well Patient left: in chair;with call bell/phone within reach;with family/visitor present     Time: 0902-0931 PT Time Calculation (min) (ACUTE ONLY): 29 min  Charges:  $  Gait Training: 8-22 mins $Therapeutic Activity: 8-22 mins                    G Codes:      Cassell Clement, PT, CSCS Pager [phone] Office [phone]  10/20/2016, 9:38 AM

## 2016-11-07 ENCOUNTER — Ambulatory Visit (HOSPITAL_COMMUNITY): Payer: Commercial Managed Care - HMO | Attending: Orthopedic Surgery | Admitting: Physical Therapy

## 2016-11-07 ENCOUNTER — Encounter (HOSPITAL_COMMUNITY): Payer: Self-pay | Admitting: Physical Therapy

## 2016-11-07 DIAGNOSIS — M6281 Muscle weakness (generalized): Secondary | ICD-10-CM

## 2016-11-07 DIAGNOSIS — R262 Difficulty in walking, not elsewhere classified: Secondary | ICD-10-CM | POA: Diagnosis present

## 2016-11-07 DIAGNOSIS — M25551 Pain in right hip: Secondary | ICD-10-CM | POA: Insufficient documentation

## 2016-11-07 DIAGNOSIS — R2681 Unsteadiness on feet: Secondary | ICD-10-CM | POA: Insufficient documentation

## 2016-11-07 NOTE — Therapy (Signed)
[place], [place], [address] Phone: [phone]   Fax:  [phone]  Physical Therapy Evaluation  Patient Details  Name: [name] MRN: [id number] Date of Birth: 1946/08/31 Referring Provider: Ninetta Lights   Encounter Date: 11/07/2016      PT End of Session - 11/07/16 1130    Visit Number 1   Number of Visits 4   Date for PT Re-Evaluation 12/05/16   Authorization Type [employer]    Authorization Time Period 11/07/16 to 12/07/16   Authorization - Visit Number 1   Authorization - Number of Visits 10   PT Start Time [phone]   PT Stop Time [id number]  patient took a very long bathroom break in middle of session    PT Time Calculation (min) 28 min   Activity Tolerance Patient tolerated treatment well   Behavior During Therapy [facility] for tasks assessed/performed      Past Medical History:  Diagnosis Date  . Allergy    SEASONAL-flonase daily as well as Claritin  . Anemia    takes Ferrous Sulfate daily  . Anxiety    takes Celexa every evening  . Arthritis    BACK/KNEE  . Cataract    IMMATURE AND ON LEFT EYE SHE THINKS  . Chronic back pain   . Chronic back pain    L1 FRACTURE  . Depression    takes Wellbutrin daily  . Diabetes mellitus without complication ([place])    type II  . Dyspnea    with exertion  . Fibromyalgia   . GERD (gastroesophageal reflux disease)    takes Omeprazole daily and Zantac at bedtime  . H/O hiatal hernia   . History of colon polyps   . History of shingles   . Hyperlipidemia    takes Pravastatin daily as well as Questran  . Hypertension    takes Diovan daily  . Insomnia    takes trazodone nightly  . Joint pain   . Muscle spasm    takes Robaxin daily as needed  . Nausea    takes Zofran daily as needed  . Nocturia   . Numbness and tingling    LEGS  . Osteoporosis    takes fosamax  . Sleep apnea    CPAP-SLEEP STUDY DONE > 5 YRS AGO  . Thyroid disease    takes  Synthroid daily    Past Surgical History:  Procedure Laterality Date  . ABDOMINAL HYSTERECTOMY    . CARDIAC CATHETERIZATION    . CARPAL TUNNEL RELEASE     right and lt  . CHOLECYSTECTOMY    . COLONOSCOPY    . COLONOSCOPY    . EYE SURGERY Bilateral    Cataract with lens  . FOOT SURGERY Bilateral   . FRACTURE SURGERY     rt hand  . FRACTURE SURGERY     lr wrist  . HAND SURGERY Right 06/1985  . HAND SURGERY Left   . KNEE ARTHROSCOPY Left   . KYPHOPLASTY N/A 12/21/2014   Procedure: KYPHOPLASTY LUMBAR ONE;  Surgeon: Newman Pies, MD;  Location: [place] [facility];  Service: Neurosurgery;  Laterality: N/A;  L1 Kyphoplasty  . ORIF FINGER FRACTURE  04/11/2012   Procedure: OPEN REDUCTION INTERNAL FIXATION (ORIF) METACARPAL (FINGER) FRACTURE;  Surgeon: Ninetta Lights, MD;  Location: [place];  Service: Orthopedics;  Laterality: Left;  orif left hand 4th metacarpal   . 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  RECTOCELE REPAIR    . SHOULDER ARTHROSCOPY WITH DISTAL CLAVICLE RESECTION Right 05/20/2015   Procedure: SHOULDER ARTHROSCOPY WITH DISTAL CLAVICLE RESECTION DEBRIDEMENT PARTIAL ROTATOR CUFF LABRAL TEAR REMOVAL LOOSE BODIES;  Surgeon: Kathryne Hitch, MD;  Location: [place];  Service: Orthopedics;  Laterality: Right;  . TONSILLECTOMY    . TOTAL HIP ARTHROPLASTY Right 10/18/2016   Procedure: TOTAL HIP ARTHROPLASTY ANTERIOR APPROACH;  Surgeon: Ninetta Lights, MD;  Location: [place];  Service: Orthopedics;  Laterality: Right;  . UPPER GASTROINTESTINAL ENDOSCOPY  02/2013  . WRIST SURGERY Left     There were no vitals filed for this visit.       Subjective Assessment - 11/07/16 0951    Subjective Patient states that she started hurting in her R groin area and went to see Dr. Percell [place], who thought it was her hip causing the pain; MRI confirmed that her hip was the problem. She tried shots in her hip but this did not help. She ended up having R  anterior hip surgery on the 25th by Dr. Percell [place]. Things have been going well since; it is hard for her to put her socks on, her walking and balance have been good. She did get HHPT but has been DCed.    Pertinent History OSA, Lumbar compression fracture, obesity, fibromyalgia, DM, R anterior hip    How long can you sit comfortably? unlimited    How long can you stand comfortably? not sure    How long can you walk comfortably? household distances, not sure otherwise    Patient Stated Goals be able to put on socks again    Pain Score 5    Pain Location Leg   Pain Orientation Right   Pain Descriptors / Indicators Discomfort;Tender   Pain Type Surgical pain   Pain Frequency Intermittent            [facility] PT Assessment - 11/07/16 0001      Assessment   Medical Diagnosis R anterior hip replacement    Referring Provider Ninetta Lights    Onset Date/Surgical Date 10/18/16   Next MD Visit Dr. Percell [place] December the 5th    Prior Therapy HHPT for 7 visits      Precautions   Precaution Comments R anterior hip precautions      Balance Screen   Has the patient fallen in the past 6 months No   Has the patient had a decrease in activity level because of a fear of falling?  No   Is the patient reluctant to leave their home because of a fear of falling?  No     Prior Function   Level of Independence Independent;Independent with basic ADLs;Requires assistive device for independence;Independent with gait;Independent with transfers   Vocation Retired     Observation/Other Assessments   Focus on Therapeutic Outcomes (FOTO)  57% limited      Strength   Right Hip Flexion 2+/5   Right Hip ABduction 2/5   Left Hip Flexion 3-/5   Right Knee Flexion 5/5   Right Knee Extension 4+/5   Left Knee Flexion 5/5   Left Knee Extension 4+/5   Right Ankle Dorsiflexion 4+/5   Left Ankle Dorsiflexion 5/5     Ambulation/Gait   Gait Comments flexed at hips, reduced gait speed, general weakness      6 minute  walk test results    Aerobic Endurance Distance Walked 339   Endurance additional comments 3MWT, walker      High  Level Balance   High Level Balance Comments TUG 18.8 with walker                            PT Education - 11/07/16 1130    Education provided Yes   Education Details prognosis, POC, standard anterior hip precautions, HEP to be given next session, safety with mobility    Person(s) Educated Patient   Methods Explanation   Comprehension Verbalized understanding          PT Short Term Goals - 11/07/16 1136      PT SHORT TERM GOAL #1   Title Patient to demonstrate ability to safely ambulate unlimited distances with LRAD, minimal R hip pain or general fatigue, in order to improve mobility    Time 2   Period Weeks   Status New     PT SHORT TERM GOAL #2   Title Patient to be able to perform all bed mobility and functional transfers within correct hip precautions in order to show improved general safety of mobility    Time 2   Period Weeks   Status New     PT SHORT TERM GOAL #3   Title Patient to be able to verbally state anterior hip precautions to show improved safety and general functional awareness    Time 1   Period Weeks   Status New     PT SHORT TERM GOAL #4   Title Patient to be independent in correctly and consistently performing appropriate HEP, to be updated PRN    Time 1   Period Weeks   Status New           PT Long Term Goals - 11/07/16 1138      PT LONG TERM GOAL #1   Title Patient to demonstrate functional strength as being at least 4/5 in all tested groups in order to improve mobility and balance skills    Time 4   Period Weeks   Status New     PT LONG TERM GOAL #2   Title Patient to be able to complete TUG test in 12 seconds or less with LRAD in order to show improved mobility and reduced fall risk    Time 4   Period Weeks   Status New     PT LONG TERM GOAL #3   Title Patient to be able to ambulate at least  649ft with LRAD during 3MWT in order to show improved mobility and community access    Time 4   Period Weeks   Status New     PT LONG TERM GOAL #4   Title Patient to be participatory in regular aerobic exercise program, at least 20 minutes per session, at at least 4 days per week, in order to maintain functional gains and improve general health status    Time 4   Period Weeks   Status New               Plan - 11/07/16 1132    Clinical Impression Statement Patient arrives today after having an anterior hip replacement done in late October on her R LE; she did receive HHPT services, and reports she has been keeping up with her HEP, but does seem rather turned about regarding her anterior hip precautions this morning. Today's skilled evaluation limited in scope due to patient needing a long bathroom break mid-session. Skilled examination identifies gait and postural deficits, significant functional weakness, and note-able unsteadiness  as well. Patient will benefit from skilled PT services to address functional deficits, optimize level of function, and reduce fall risk. She requests 1x/week due to high co-pay/financial concerns.    Rehab Potential Good   Clinical Impairments Affecting Rehab Potential history of compression fractures/current poor posture, reduced safety awareness, financial concerns    PT Frequency 1x / week   PT Duration 4 weeks   PT Treatment/Interventions ADLs/Self Care Home Management;Biofeedback;Cryotherapy;Moist Heat;Gait training;Stair training;Functional mobility training;Therapeutic activities;Therapeutic exercise;Balance training;Neuromuscular re-education;Patient/family education;Manual techniques;Energy conservation;Taping   PT Next Visit Plan assign HEP (See below); functional strength, balance and gait training with LRAD    PT Home Exercise Plan next session assign proximal strengthening within hip precautions    Consulted and Agree with Plan of Care Patient       Patient will benefit from skilled therapeutic intervention in order to improve the following deficits and impairments:  Abnormal gait, Improper body mechanics, Pain, Decreased coordination, Decreased mobility, Postural dysfunction, Decreased activity tolerance, Decreased strength, Decreased balance, Difficulty walking, Decreased safety awareness  Visit Diagnosis: Pain in right hip - Plan: PT plan of care cert/re-cert  Muscle weakness (generalized) - Plan: PT plan of care cert/re-cert  Unsteadiness on feet - Plan: PT plan of care cert/re-cert  Difficulty in walking, not elsewhere classified - Plan: PT plan of care cert/re-cert      G-Codes - [id number]    Functional Assessment Tool Used Based on skilled clinical assessment of strength, gait, balance, safety awareness    Functional Limitation Mobility: Walking and moving around   Mobility: Walking and Moving Around Current Status [id number]) At least 40 percent but less than 60 percent impaired, limited or restricted   Mobility: Walking and Moving Around Goal Status [id number]) At least 20 percent but less than 40 percent impaired, limited or restricted       Problem List Patient Active Problem List   Diagnosis Date Noted  . S/P total hip arthroplasty 10/18/2016  . Hypersomnia with sleep apnea 07/20/2015  . Hypersomnia, persistent 07/20/2015  . OSA (obstructive sleep apnea) 07/20/2015  . Lumbar compression fracture ([place]) 12/21/2014  . Morbid obesity ([place]) 03/07/2013  . Obstructive sleep apnea 03/07/2013  . Fibromyalgia 03/07/2013  . Other and unspecified hyperlipidemia 03/07/2013  . Hx of gastritis 03/07/2013  . S/P cholecystectomy 03/07/2013  . Chest pain 01/07/2013    Deniece Ree PT, DPT [place] [address] [place], [place], [address] Phone: [phone]   Fax:  [phone]  Name: [name] MRN: [id number] Date of Birth: 10-23-1946

## 2016-11-15 ENCOUNTER — Ambulatory Visit (HOSPITAL_COMMUNITY): Payer: Commercial Managed Care - HMO

## 2016-11-15 ENCOUNTER — Encounter (HOSPITAL_COMMUNITY): Payer: Self-pay

## 2016-11-15 DIAGNOSIS — R2681 Unsteadiness on feet: Secondary | ICD-10-CM

## 2016-11-15 DIAGNOSIS — M25551 Pain in right hip: Secondary | ICD-10-CM

## 2016-11-15 DIAGNOSIS — R262 Difficulty in walking, not elsewhere classified: Secondary | ICD-10-CM

## 2016-11-15 DIAGNOSIS — M6281 Muscle weakness (generalized): Secondary | ICD-10-CM

## 2016-11-15 NOTE — Therapy (Signed)
[place], [place], [address] Phone: [phone]   Fax:  [phone]  Physical Therapy Treatment  Patient Details  Name: [name] MRN: [id number] Date of Birth: June 04, 1946 Referring Provider: Ninetta Lights   Encounter Date: 11/15/2016      PT End of Session - 11/15/16 1042    Visit Number 2   Number of Visits 4   Date for PT Re-Evaluation 12/05/16   Authorization Type [employer]    Authorization Time Period 11/07/16 to 12/07/16   Authorization - Visit Number 2   Authorization - Number of Visits 10   PT Start Time 1034   PT Stop Time 1116   PT Time Calculation (min) 42 min   Activity Tolerance Patient tolerated treatment well   Behavior During Therapy [facility] for tasks assessed/performed      Past Medical History:  Diagnosis Date  . Allergy    SEASONAL-flonase daily as well as Claritin  . Anemia    takes Ferrous Sulfate daily  . Anxiety    takes Celexa every evening  . Arthritis    BACK/KNEE  . Cataract    IMMATURE AND ON LEFT EYE SHE THINKS  . Chronic back pain   . Chronic back pain    L1 FRACTURE  . Depression    takes Wellbutrin daily  . Diabetes mellitus without complication ([place])    type II  . Dyspnea    with exertion  . Fibromyalgia   . GERD (gastroesophageal reflux disease)    takes Omeprazole daily and Zantac at bedtime  . H/O hiatal hernia   . History of colon polyps   . History of shingles   . Hyperlipidemia    takes Pravastatin daily as well as Questran  . Hypertension    takes Diovan daily  . Insomnia    takes trazodone nightly  . Joint pain   . Muscle spasm    takes Robaxin daily as needed  . Nausea    takes Zofran daily as needed  . Nocturia   . Numbness and tingling    LEGS  . Osteoporosis    takes fosamax  . Sleep apnea    CPAP-SLEEP STUDY DONE > 5 YRS AGO  . Thyroid disease    takes Synthroid daily    Past Surgical History:  Procedure Laterality  Date  . ABDOMINAL HYSTERECTOMY    . CARDIAC CATHETERIZATION    . CARPAL TUNNEL RELEASE     right and lt  . CHOLECYSTECTOMY    . COLONOSCOPY    . COLONOSCOPY    . EYE SURGERY Bilateral    Cataract with lens  . FOOT SURGERY Bilateral   . FRACTURE SURGERY     rt hand  . FRACTURE SURGERY     lr wrist  . HAND SURGERY Right 06/1985  . HAND SURGERY Left   . KNEE ARTHROSCOPY Left   . KYPHOPLASTY N/A 12/21/2014   Procedure: KYPHOPLASTY LUMBAR ONE;  Surgeon: Newman Pies, MD;  Location: [place] [facility];  Service: Neurosurgery;  Laterality: N/A;  L1 Kyphoplasty  . ORIF FINGER FRACTURE  04/11/2012   Procedure: OPEN REDUCTION INTERNAL FIXATION (ORIF) METACARPAL (FINGER) FRACTURE;  Surgeon: Ninetta Lights, MD;  Location: [place];  Service: Orthopedics;  Laterality: Left;  orif left hand 4th metacarpal   . 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 RECTOCELE REPAIR    . SHOULDER ARTHROSCOPY WITH DISTAL CLAVICLE  RESECTION Right 05/20/2015   Procedure: SHOULDER ARTHROSCOPY WITH DISTAL CLAVICLE RESECTION DEBRIDEMENT PARTIAL ROTATOR CUFF LABRAL TEAR REMOVAL LOOSE BODIES;  Surgeon: Kathryne Hitch, MD;  Location: [place];  Service: Orthopedics;  Laterality: Right;  . TONSILLECTOMY    . TOTAL HIP ARTHROPLASTY Right 10/18/2016   Procedure: TOTAL HIP ARTHROPLASTY ANTERIOR APPROACH;  Surgeon: Ninetta Lights, MD;  Location: [place];  Service: Orthopedics;  Laterality: Right;  . UPPER GASTROINTESTINAL ENDOSCOPY  02/2013  . WRIST SURGERY Left     There were no vitals filed for this visit.      Subjective Assessment - 11/15/16 1033    Subjective Pt stated she has tenderness Rt LE on lateral quadriceps, pain scale 5/10.  Reports compliance with HHPT HEP 2x daily.   Pertinent History OSA, Lumbar compression fracture, obesity, fibromyalgia, DM, R anterior hip    Patient Stated Goals be able to put on socks again    Currently in Pain? Yes   Pain Score 5    Pain  Location Leg   Pain Orientation Proximal;Anterior;Lateral   Pain Descriptors / Indicators Tender   Pain Type Surgical pain   Pain Onset More than a month ago   Pain Frequency Intermittent   Aggravating Factors  sitting or standing for prolonged periods   Pain Relieving Factors pain medication, ice   Effect of Pain on Daily Activities minimal effects of ADL              [facility] Adult PT Treatment/Exercise - 11/15/16 0001      Bed Mobility   Bed Mobility Rolling Right;Rolling Left   Rolling Right 5: Supervision   Rolling Right Details (indicate cue type and reason) Instructed log rolling for bed mobilty   Rolling Left 5: Supervision   Rolling Left Details (indicate cue type and reason) Instructed log rolling for bed mobilty     Ambulation/Gait   Ambulation/Gait Yes   Ambulation/Gait Assistance 5: Supervision   Ambulation Distance (Feet) 552 Feet   Assistive device Small based quad cane   Gait Pattern Within Functional Limits;Step-through pattern   Gait Comments flexed at hips, reduced gait speed, general weakness      Exercises   Exercises Knee/Hip     Knee/Hip Exercises: Standing   Functional Squat 10 reps   SLS Rt 3" Lt 3' max of    Other Standing Knee Exercises 2RT sidestepping with RTB     Knee/Hip Exercises: Seated   Clamshell with TheraBand --   Sit to Sand 10 reps;without UE support     Knee/Hip Exercises: Supine   Bridges 15 reps   Other Supine Knee/Hip Exercises rolling Rt to Lt 10x   Other Supine Knee/Hip Exercises clam 2 sets x 10 reps with RTB                PT Education - 11/15/16 1052    Education provided Yes   Education Details reviewed goals, complaince with HHPT HEP and established additional HEP, copy of eval given to pt, reviewed hip anterior hip precautions, instructed bed mobilty   Person(s) Educated Patient   Methods Explanation;Demonstration;Handout   Comprehension Verbalized understanding;Returned demonstration          PT  Short Term Goals - 11/07/16 1136      PT SHORT TERM GOAL #1   Title Patient to demonstrate ability to safely ambulate unlimited distances with LRAD, minimal R hip pain or general fatigue, in order to improve mobility    Time 2  Period Weeks   Status New     PT SHORT TERM GOAL #2   Title Patient to be able to perform all bed mobility and functional transfers within correct hip precautions in order to show improved general safety of mobility    Time 2   Period Weeks   Status New     PT SHORT TERM GOAL #3   Title Patient to be able to verbally state anterior hip precautions to show improved safety and general functional awareness    Time 1   Period Weeks   Status New     PT SHORT TERM GOAL #4   Title Patient to be independent in correctly and consistently performing appropriate HEP, to be updated PRN    Time 1   Period Weeks   Status New           PT Long Term Goals - 11/07/16 1138      PT LONG TERM GOAL #1   Title Patient to demonstrate functional strength as being at least 4/5 in all tested groups in order to improve mobility and balance skills    Time 4   Period Weeks   Status New     PT LONG TERM GOAL #2   Title Patient to be able to complete TUG test in 12 seconds or less with LRAD in order to show improved mobility and reduced fall risk    Time 4   Period Weeks   Status New     PT LONG TERM GOAL #3   Title Patient to be able to ambulate at least 641ft with LRAD during 3MWT in order to show improved mobility and community access    Time 4   Period Weeks   Status New     PT LONG TERM GOAL #4   Title Patient to be participatory in regular aerobic exercise program, at least 20 minutes per session, at at least 4 days per week, in order to maintain functional gains and improve general health status    Time 4   Period Weeks   Status New               Plan - 11/15/16 1249    Clinical Impression Statement Reviewed goals, compliance with HHPT HEP and  established additional HEP, copy of eval given to pt.  Session focus on improving body mechanics for bed mobilty, functional strengthening and gait training with LRAD.  Educated on anterior hip precautions, pt able to verbalize understanding.  Pt instructed log rolling to improve body mechanics with initial difficulty laying on Rt hip, began rolling exercise with ability to complete log rolling both directions and reports of hip pain resolved following instructions.  Pt completed therex with minimal cueing for proper form and technique.  Good mechanics noted with 2 point step through with QC and SBA, no LOB episodes noted.  Pt encouraged to begin using cane more indoors but to continue wiht RW outdoors and gait for long periods of time for safety.  Added balance activities at EOS with min A for safety to reduce risk of falls.  EOS no reports of pain, was limited by fatigue with activities.     Rehab Potential Good   Clinical Impairments Affecting Rehab Potential history of compression fractures/current poor posture, reduced safety awareness, financial concerns    PT Frequency 1x / week   PT Duration 4 weeks   PT Treatment/Interventions ADLs/Self Care Home Management;Biofeedback;Cryotherapy;Moist Heat;Gait training;Stair training;Functional mobility training;Therapeutic activities;Therapeutic exercise;Balance training;Neuromuscular re-education;Patient/family  education;Manual techniques;Energy conservation;Taping   PT Next Visit Plan Next session review complaince with HEP.  Pt asked to bring in copy of current HHPT HEP, review exercises and D/C exercises no longer appropriate.  Focus on functional strengthening and balance training with CKC next session.     PT Home Exercise Plan 11/15/2016: STS, squat, sidestep RTB and SLS.  Review HHPT HEP exercises and consolidate important exercises next session.        Patient will benefit from skilled therapeutic intervention in order to improve the following  deficits and impairments:  Abnormal gait, Improper body mechanics, Pain, Decreased coordination, Decreased mobility, Postural dysfunction, Decreased activity tolerance, Decreased strength, Decreased balance, Difficulty walking, Decreased safety awareness  Visit Diagnosis: Pain in right hip  Muscle weakness (generalized)  Unsteadiness on feet  Difficulty in walking, not elsewhere classified     Problem List Patient Active Problem List   Diagnosis Date Noted  . S/P total hip arthroplasty 10/18/2016  . Hypersomnia with sleep apnea 07/20/2015  . Hypersomnia, persistent 07/20/2015  . OSA (obstructive sleep apnea) 07/20/2015  . Lumbar compression fracture ([place]) 12/21/2014  . Morbid obesity ([place]) 03/07/2013  . Obstructive sleep apnea 03/07/2013  . Fibromyalgia 03/07/2013  . Other and unspecified hyperlipidemia 03/07/2013  . Hx of gastritis 03/07/2013  . S/P cholecystectomy 03/07/2013  . Chest pain 01/07/2013   Ihor Austin, [place]; [place]  Aldona Lento 11/15/2016, 5:32 PM  [place] [address] [place], [place], [address] Phone: [phone]   Fax:  [phone]  Name: [name] MRN: [id number] Date of Birth: 1946/11/05

## 2016-11-15 NOTE — Patient Instructions (Signed)
Functional Quadriceps: Sit to Stand    Sit on edge of chair, feet flat on floor. Stand upright, extending knees fully. Repeat 10 times per set. Do 2  sets per session.   http://orth.exer.us/735   Copyright  VHI. All rights reserved.   FUNCTIONAL MOBILITY: Squat    Stance: shoulder-width on floor. Bend hips and knees. Keep back straight. Do not allow knees to bend past toes. Squeeze glutes and quads to stand. 10 reps per set, 2 sets per day.  Copyright  VHI. All rights reserved.   Band Walk: Side Stepping    Tie band around legs, just above knees. Step 10  feet to one side, then step back to start. Repeat 10 feet per session. Note: Small towel between band and skin eases rubbing.  http://plyo.exer.us/76   Copyright  VHI. All rights reserved.   Single Leg Balance: Eyes Open    Stand on right leg with eyes open. Hold 30 seconds. 5 reps 1-2 times per day.  http://ggbe.exer.us/5   Copyright  VHI. All rights reserved.

## 2016-11-21 ENCOUNTER — Ambulatory Visit (HOSPITAL_COMMUNITY): Payer: Commercial Managed Care - HMO

## 2016-11-21 DIAGNOSIS — M25551 Pain in right hip: Secondary | ICD-10-CM | POA: Diagnosis not present

## 2016-11-21 DIAGNOSIS — R2681 Unsteadiness on feet: Secondary | ICD-10-CM

## 2016-11-21 DIAGNOSIS — M6281 Muscle weakness (generalized): Secondary | ICD-10-CM

## 2016-11-21 DIAGNOSIS — R262 Difficulty in walking, not elsewhere classified: Secondary | ICD-10-CM

## 2016-11-21 NOTE — Therapy (Signed)
[place], [place], [address] Phone: [phone]   Fax:  [phone]  Physical Therapy Treatment  Patient Details  Name: [name] MRN: [id number] Date of Birth: 1946/09/07 Referring Provider: Ninetta Lights  Encounter Date: 11/21/2016      PT End of Session - 11/21/16 1030    Visit Number 3   Number of Visits 4   Date for PT Re-Evaluation 12/05/16   Authorization Type [employer]    Authorization Time Period 11/07/16 to 12/07/16   Authorization - Visit Number 3   Authorization - Number of Visits 10   PT Start Time 1028   PT Stop Time 1117   PT Time Calculation (min) 49 min   Equipment Utilized During Treatment Gait belt   Activity Tolerance Patient tolerated treatment well   Behavior During Therapy [facility] for tasks assessed/performed      Past Medical History:  Diagnosis Date  . Allergy    SEASONAL-flonase daily as well as Claritin  . Anemia    takes Ferrous Sulfate daily  . Anxiety    takes Celexa every evening  . Arthritis    BACK/KNEE  . Cataract    IMMATURE AND ON LEFT EYE SHE THINKS  . Chronic back pain   . Chronic back pain    L1 FRACTURE  . Depression    takes Wellbutrin daily  . Diabetes mellitus without complication ([place])    type II  . Dyspnea    with exertion  . Fibromyalgia   . GERD (gastroesophageal reflux disease)    takes Omeprazole daily and Zantac at bedtime  . H/O hiatal hernia   . History of colon polyps   . History of shingles   . Hyperlipidemia    takes Pravastatin daily as well as Questran  . Hypertension    takes Diovan daily  . Insomnia    takes trazodone nightly  . Joint pain   . Muscle spasm    takes Robaxin daily as needed  . Nausea    takes Zofran daily as needed  . Nocturia   . Numbness and tingling    LEGS  . Osteoporosis    takes fosamax  . Sleep apnea    CPAP-SLEEP STUDY DONE > 5 YRS AGO  . Thyroid disease    takes Synthroid daily     Past Surgical History:  Procedure Laterality Date  . ABDOMINAL HYSTERECTOMY    . CARDIAC CATHETERIZATION    . CARPAL TUNNEL RELEASE     right and lt  . CHOLECYSTECTOMY    . COLONOSCOPY    . COLONOSCOPY    . EYE SURGERY Bilateral    Cataract with lens  . FOOT SURGERY Bilateral   . FRACTURE SURGERY     rt hand  . FRACTURE SURGERY     lr wrist  . HAND SURGERY Right 06/1985  . HAND SURGERY Left   . KNEE ARTHROSCOPY Left   . KYPHOPLASTY N/A 12/21/2014   Procedure: KYPHOPLASTY LUMBAR ONE;  Surgeon: Newman Pies, MD;  Location: [facility];  Service: Neurosurgery;  Laterality: N/A;  L1 Kyphoplasty  . ORIF FINGER FRACTURE  04/11/2012   Procedure: OPEN REDUCTION INTERNAL FIXATION (ORIF) METACARPAL (FINGER) FRACTURE;  Surgeon: Ninetta Lights, MD;  Location: [facility];  Service: Orthopedics;  Laterality: Left;  orif left hand 4th metacarpal   . 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 RECTOCELE REPAIR    .  SHOULDER ARTHROSCOPY WITH DISTAL CLAVICLE RESECTION Right 05/20/2015   Procedure: SHOULDER ARTHROSCOPY WITH DISTAL CLAVICLE RESECTION DEBRIDEMENT PARTIAL ROTATOR CUFF LABRAL TEAR REMOVAL LOOSE BODIES;  Surgeon: Kathryne Hitch, MD;  Location: [facility];  Service: Orthopedics;  Laterality: Right;  . TONSILLECTOMY    . TOTAL HIP ARTHROPLASTY Right 10/18/2016   Procedure: TOTAL HIP ARTHROPLASTY ANTERIOR APPROACH;  Surgeon: Ninetta Lights, MD;  Location: [facility];  Service: Orthopedics;  Laterality: Right;  . UPPER GASTROINTESTINAL ENDOSCOPY  02/2013  . WRIST SURGERY Left     There were no vitals filed for this visit.      Subjective Assessment - 11/21/16 1029    Subjective Pt stated she is feeling good today, no reports of pain.  Reports compliance with HEP daily and stated most difficulty with balance activities.     Pertinent History OSA, Lumbar compression fracture, obesity, fibromyalgia, DM, R anterior hip    Patient Stated Goals be  able to put on socks again    Currently in Pain? No/denies            [facility] PT Assessment - 11/21/16 0001      Assessment   Medical Diagnosis R anterior hip replacement    Referring Provider Ninetta Lights   Onset Date/Surgical Date 10/18/16   Hand Dominance Right   Next MD Visit Dr. Percell Miller December the 5th    Prior Therapy HHPT for 7 visits      Precautions   Precautions Anterior Hip   Precaution Comments R anterior hip precautions                      [facility] Adult PT Treatment/Exercise - 11/21/16 0001      Ambulation/Gait   Gait Comments Adjusted RW height to reduce flexed hip and improve posture     Knee/Hip Exercises: Standing   Heel Raises 20 reps   Heel Raises Limitations no HHA; 1 finger with toe raises   Lateral Step Up Right;10 reps;Hand Hold: 2;Step Height: 4"   Forward Step Up Right;10 reps;Hand Hold: 1;Step Height: 4"   Functional Squat 15 reps   Stairs reciprocal pattern 4 in 2RT   Rocker Board 2 minutes   Rocker Board Limitations R/L and A/P with minimal HHA   SLS Rt 5" Lt 3"   Gait Training 228 ft with SPC SBA   Other Standing Knee Exercises Tandem stance on floor then airex 3x 30"; Tandem gait 2RT                  PT Short Term Goals - 11/07/16 1136      PT SHORT TERM GOAL #1   Title Patient to demonstrate ability to safely ambulate unlimited distances with LRAD, minimal R hip pain or general fatigue, in order to improve mobility    Time 2   Period Weeks   Status New     PT SHORT TERM GOAL #2   Title Patient to be able to perform all bed mobility and functional transfers within correct hip precautions in order to show improved general safety of mobility    Time 2   Period Weeks   Status New     PT SHORT TERM GOAL #3   Title Patient to be able to verbally state anterior hip precautions to show improved safety and general functional awareness    Time 1   Period Weeks   Status New     PT SHORT TERM GOAL #4  Title  Patient to be independent in correctly and consistently performing appropriate HEP, to be updated PRN    Time 1   Period Weeks   Status New           PT Long Term Goals - 11/07/16 1138      PT LONG TERM GOAL #1   Title Patient to demonstrate functional strength as being at least 4/5 in all tested groups in order to improve mobility and balance skills    Time 4   Period Weeks   Status New     PT LONG TERM GOAL #2   Title Patient to be able to complete TUG test in 12 seconds or less with LRAD in order to show improved mobility and reduced fall risk    Time 4   Period Weeks   Status New     PT LONG TERM GOAL #3   Title Patient to be able to ambulate at least 671ft with LRAD during 3MWT in order to show improved mobility and community access    Time 4   Period Weeks   Status New     PT LONG TERM GOAL #4   Title Patient to be participatory in regular aerobic exercise program, at least 20 minutes per session, at at least 4 days per week, in order to maintain functional gains and improve general health status    Time 4   Period Weeks   Status New               Plan - 11/21/16 1140    Clinical Impression Statement Pt entered dept with increased forward flexion posture while ambulating with RW.  Walker adjusted to appropriate height with improved gait mechanics noted.   Session focus on improving functional strengthening with CKC activities and balance training as well as gait training with LRAD ([place]).  Began stair training with cueing to reduce compensation due to weakness and progressed balance training with static and dynamic surface and pregait balance activities with min A required.  Pt able to demonstrate safe mechnaics with SPC, pt encouraged to begin ambulating with LRAD indoors with company but to continue with RW.  No reports of pain through session.     Rehab Potential Good   Clinical Impairments Affecting Rehab Potential history of compression fractures/current poor  posture, reduced safety awareness, financial concerns    PT Frequency 1x / week   PT Duration 4 weeks   PT Treatment/Interventions ADLs/Self Care Home Management;Biofeedback;Cryotherapy;Moist Heat;Gait training;Stair training;Functional mobility training;Therapeutic activities;Therapeutic exercise;Balance training;Neuromuscular re-education;Patient/family education;Manual techniques;Energy conservation;Taping   PT Next Visit Plan Reassess next session.  Progress functional strenghtening and balance training with CKC next session.  Establish advanced HEP.     PT Home Exercise Plan 11/15/2016: STS, squat, sidestep RTB and SLS.  Review HHPT HEP exercises and consolidate important exercises next session.        Patient will benefit from skilled therapeutic intervention in order to improve the following deficits and impairments:  Abnormal gait, Improper body mechanics, Pain, Decreased coordination, Decreased mobility, Postural dysfunction, Decreased activity tolerance, Decreased strength, Decreased balance, Difficulty walking, Decreased safety awareness  Visit Diagnosis: Pain in right hip  Muscle weakness (generalized)  Unsteadiness on feet  Difficulty in walking, not elsewhere classified     Problem List Patient Active Problem List   Diagnosis Date Noted  . S/P total hip arthroplasty 10/18/2016  . Hypersomnia with sleep apnea 07/20/2015  . Hypersomnia, persistent 07/20/2015  . OSA (obstructive sleep apnea) 07/20/2015  .  Lumbar compression fracture ([place]) 12/21/2014  . Morbid obesity ([place]) 03/07/2013  . Obstructive sleep apnea 03/07/2013  . Fibromyalgia 03/07/2013  . Other and unspecified hyperlipidemia 03/07/2013  . Hx of gastritis 03/07/2013  . S/P cholecystectomy 03/07/2013  . Chest pain 01/07/2013   Ihor Austin, [place]; [place]  Aldona Lento 11/21/2016, 12:28 PM  [place] [address] [place], [place],  [address] Phone: [phone]   Fax:  [phone]  Name: [name] MRN: [id number] Date of Birth: 1946/03/14

## 2016-11-29 ENCOUNTER — Ambulatory Visit (HOSPITAL_COMMUNITY): Payer: No Typology Code available for payment source | Admitting: Physical Therapy

## 2016-11-29 ENCOUNTER — Telehealth (HOSPITAL_COMMUNITY): Payer: Self-pay | Admitting: Internal Medicine

## 2016-11-29 NOTE — Telephone Encounter (Signed)
Patient was told by her MD that therapy is no longer needed.

## 2016-12-06 ENCOUNTER — Encounter (HOSPITAL_COMMUNITY): Payer: Commercial Managed Care - HMO | Admitting: Physical Therapy

## 2017-08-21 ENCOUNTER — Encounter (HOSPITAL_COMMUNITY): Payer: Self-pay | Admitting: Physical Therapy

## 2017-08-21 NOTE — Therapy (Signed)
[place], [place], [address] Phone: [phone]   Fax:  [phone]  Patient Details  Name: [name] MRN: [id number] Date of Birth: 1946-03-13 Referring Provider:  No ref. provider found  Encounter Date: 08/21/2017   PHYSICAL THERAPY DISCHARGE SUMMARY  Visits from Start of Care: 3  Current functional level related to goals / functional outcomes: Patient discharged by MD due to high level of progress   Remaining deficits: Unable to assess    Education / Equipment: N/A  Plan: Patient agrees to discharge.  Patient goals were not met. Patient is being discharged due to being pleased with the current functional level.  ?????      Deniece Ree PT, DPT [facility] [address] [place], [place], [address] Phone: [phone]   Fax:  [phone]

## 2017-11-01 ENCOUNTER — Encounter: Payer: Self-pay | Admitting: Podiatry

## 2017-11-01 ENCOUNTER — Ambulatory Visit: Payer: Medicare Other | Admitting: Podiatry

## 2017-11-01 ENCOUNTER — Ambulatory Visit (INDEPENDENT_AMBULATORY_CARE_PROVIDER_SITE_OTHER): Payer: Medicare Other

## 2017-11-01 VITALS — BP 124/70 | HR 79 | Ht 59.5 in | Wt 211.0 lb

## 2017-11-01 DIAGNOSIS — M79671 Pain in right foot: Secondary | ICD-10-CM

## 2017-11-01 DIAGNOSIS — M722 Plantar fascial fibromatosis: Secondary | ICD-10-CM | POA: Diagnosis not present

## 2017-11-01 DIAGNOSIS — G8929 Other chronic pain: Secondary | ICD-10-CM | POA: Diagnosis not present

## 2017-11-01 NOTE — Patient Instructions (Signed)

## 2017-11-01 NOTE — Progress Notes (Signed)
Subjective:    Patient ID: [name], female    DOB: 05/10/1946, 71 y.o.   MRN: [id number]  HPI  Chief Complaint  Patient presents with  . Plantar Fasciitis    right heel, very painful.   . Callouses    bilateral calluses, very bothersome    71 y.o. female presents with the above complaint.  Reports right heel pain that is very painful.  Reports difficulty ambulating due to the pain.  Denies prior treatments.  Also reports callused areas to the bottom of both heels.  Past Medical History:  Diagnosis Date  . Allergy    SEASONAL-flonase daily as well as Claritin  . Anemia    takes Ferrous Sulfate daily  . Anxiety    takes Celexa every evening  . Arthritis    BACK/KNEE  . Cataract    IMMATURE AND ON LEFT EYE SHE THINKS  . Chronic back pain   . Chronic back pain    L1 FRACTURE  . Depression    takes Wellbutrin daily  . Diabetes mellitus without complication ([place])    type II  . Dyspnea    with exertion  . Fibromyalgia   . GERD (gastroesophageal reflux disease)    takes Omeprazole daily and Zantac at bedtime  . H/O hiatal hernia   . History of colon polyps   . History of shingles   . Hyperlipidemia    takes Pravastatin daily as well as Questran  . Hypertension    takes Diovan daily  . Insomnia    takes trazodone nightly  . Joint pain   . Muscle spasm    takes Robaxin daily as needed  . Nausea    takes Zofran daily as needed  . Nocturia   . Numbness and tingling    LEGS  . Osteoporosis    takes fosamax  . Sleep apnea    CPAP-SLEEP STUDY DONE > 5 YRS AGO  . Thyroid disease    takes Synthroid daily   Past Surgical History:  Procedure Laterality Date  . ABDOMINAL HYSTERECTOMY    . CARDIAC CATHETERIZATION    . CARPAL TUNNEL RELEASE     right and lt  . CHOLECYSTECTOMY    . COLONOSCOPY    . COLONOSCOPY    . EYE SURGERY Bilateral    Cataract with lens  . FOOT SURGERY Bilateral   . FRACTURE SURGERY     rt hand  . FRACTURE SURGERY     lr wrist  .  HAND SURGERY Right 06/1985  . HAND SURGERY Left   . KNEE ARTHROSCOPY Left   . KYPHOPLASTY N/A 12/21/2014   Procedure: KYPHOPLASTY LUMBAR ONE;  Surgeon: Newman Pies, MD;  Location: [facility];  Service: Neurosurgery;  Laterality: N/A;  L1 Kyphoplasty  . ORIF FINGER FRACTURE  04/11/2012   Procedure: OPEN REDUCTION INTERNAL FIXATION (ORIF) METACARPAL (FINGER) FRACTURE;  Surgeon: Ninetta Lights, MD;  Location: [facility];  Service: Orthopedics;  Laterality: Left;  orif left hand 4th metacarpal   . 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 RECTOCELE REPAIR    . SHOULDER ARTHROSCOPY WITH DISTAL CLAVICLE RESECTION Right 05/20/2015   Procedure: SHOULDER ARTHROSCOPY WITH DISTAL CLAVICLE RESECTION DEBRIDEMENT PARTIAL ROTATOR CUFF LABRAL TEAR REMOVAL LOOSE BODIES;  Surgeon: Kathryne Hitch, MD;  Location: [facility];  Service: Orthopedics;  Laterality: Right;  . TONSILLECTOMY    . TOTAL HIP ARTHROPLASTY Right 10/18/2016   Procedure: TOTAL HIP ARTHROPLASTY  ANTERIOR APPROACH;  Surgeon: Ninetta Lights, MD;  Location: [facility];  Service: Orthopedics;  Laterality: Right;  . UPPER GASTROINTESTINAL ENDOSCOPY  02/2013  . WRIST SURGERY Left     Current Outpatient Medications:  .  alendronate (FOSAMAX) 70 MG tablet, Take 70 mg by mouth once a week. Take with a full glass of water on an empty stomach., Disp: , Rfl:  .  aspirin EC 325 MG EC tablet, Take 1 tablet (325 mg total) by mouth daily with breakfast., Disp: 30 tablet, Rfl: 0 .  Biotin 1000 MCG tablet, Take 1,000 mcg by mouth daily. , Disp: , Rfl:  .  buPROPion (WELLBUTRIN SR) 150 MG 12 hr tablet, Take 150 mg by mouth daily., Disp: , Rfl:  .  Calcium Carbonate-Vit D-Min (CALCIUM 1200) 1200-1000 MG-UNIT CHEW, Chew 1 tablet by mouth daily., Disp: , Rfl:  .  citalopram (CELEXA) 40 MG tablet, Take 40 mg by mouth every evening., Disp: , Rfl:  .  Cyanocobalamin (VITAMIN B-12 PO), Take 1 tablet by mouth daily., Disp: ,  Rfl:  .  diphenoxylate-atropine (LOMOTIL) 2.5-0.025 MG per tablet, TAKE 1 TABLET BY MOUTH AFTER EACH LOOSE BOWEL MOVEMENT AS NEEDED... UP TO 8 TABLETS A DAY, Disp: , Rfl: 2 .  docusate sodium (COLACE) 100 MG capsule, Take 1 capsule (100 mg total) by mouth 2 (two) times daily. While taking narcotic medicine for pain, Disp: 10 capsule, Rfl: 0 .  fluticasone (FLONASE) 50 MCG/ACT nasal spray, Place 2 sprays into the nose daily., Disp: , Rfl:  .  levothyroxine (SYNTHROID, LEVOTHROID) 112 MCG tablet, Take 112 mcg by mouth daily before breakfast., Disp: , Rfl:  .  loratadine (CLARITIN) 10 MG tablet, Take 10 mg by mouth daily., Disp: , Rfl:  .  meloxicam (MOBIC) 7.5 MG tablet, Take 1 tablet (7.5 mg total) by mouth daily., Disp: 14 tablet, Rfl: 0 .  metFORMIN (GLUCOPHAGE-XR) 500 MG 24 hr tablet, Take 500 mg by mouth every evening., Disp: , Rfl: 1 .  methocarbamol (ROBAXIN) 500 MG tablet, Take 1 tablet (500 mg total) by mouth every 6 (six) hours as needed for muscle spasms., Disp: 30 tablet, Rfl: 0 .  Omega-3 Fatty Acids (FISH OIL) 1000 MG CAPS, Take 1,000 mg by mouth daily., Disp: , Rfl:  .  omeprazole (PRILOSEC) 40 MG capsule, Take 1 capsule (40 mg total) by mouth daily before breakfast. (Patient taking differently: Take 40 mg by mouth daily as needed (heartburn). ), Disp: 30 capsule, Rfl: 11 .  ondansetron (ZOFRAN) 4 MG tablet, Take 1 tablet (4 mg total) by mouth every 6 (six) hours as needed for nausea., Disp: 20 tablet, Rfl: 0 .  oxyCODONE-acetaminophen (PERCOCET/ROXICET) 5-325 MG tablet, Take 1 tablet by mouth every 4 (four) hours as needed for moderate pain or severe pain., Disp: 60 tablet, Rfl: 0 .  potassium chloride SA (K-DUR,KLOR-CON) 20 MEQ tablet, Take 20 mEq by mouth 2 (two) times daily., Disp: , Rfl:  .  pravastatin (PRAVACHOL) 20 MG tablet, Take 20 mg by mouth at bedtime. , Disp: , Rfl:  .  traZODone (DESYREL) 50 MG tablet, Take 50 mg by mouth at bedtime., Disp: , Rfl:  .  triamcinolone cream  (KENALOG) 0.1 %, Apply 1 application topically daily as needed (rash). , Disp: , Rfl:  .  valsartan-hydrochlorothiazide (DIOVAN-HCT) 80-12.5 MG per tablet, Take 1 tablet by mouth daily., Disp: , Rfl:   Allergies  Allergen Reactions  . Penicillins Itching    Has patient had a PCN reaction  causing immediate rash, facial/tongue/throat swelling, SOB or lightheadedness with hypotension: No Has patient had a PCN reaction causing severe rash involving mucus membranes or skin necrosis: No Has patient had a PCN reaction that required hospitalization No Has patient had a PCN reaction occurring within the last 10 years: No If all of the above answers are "NO", then may proceed with Cephalosporin use.   . Sulfa Antibiotics Swelling    Review of Systems  Musculoskeletal: Positive for arthralgias.  All other systems reviewed and are negative.      Objective:   Physical Exam Vitals:   11/01/17 1141  BP: 124/70  Pulse: 79   General AA&O x3. Normal mood and affect.  Vascular Dorsalis pedis and posterior tibial pulses  present 2+ bilaterally  Capillary refill normal to all digits. Pedal hair growth normal.  Neurologic Epicritic sensation grossly present.  Dermatologic No open lesions. Interspaces clear of maceration. Nails well groomed and normal in appearance.  Orthopedic: MMT 5/5 in dorsiflexion, plantarflexion, inversion, and eversion. Normal joint ROM without pain or crepitus. Pain to palpation right calcaneal medial tuber.  Equinus deformity noted   Radiographs: Taken and reviewed no evidence of acute fracture or dislocation.    Assessment & Plan:  Plantar Fasciitis, right - XR reviewed as above.  - Educated on icing and stretching. Instructions given.  - Injection delivered to the plantar fascia as below.  Procedure: Injection Tendon/Ligament Location: Right plantar fascia at the glabrous junction; medial approach. Skin Prep: Alcohol. Injectate: 1 cc 0.5% marcaine plain, 1 cc  dexamethasone phosphate, 0.5 cc kenalog 10. Disposition: Patient tolerated procedure well. Injection site dressed with a band-aid.  Return in about 3 weeks (around 11/22/2017) for Plantar fasciitis.

## 2017-11-22 ENCOUNTER — Ambulatory Visit: Payer: Medicare Other | Admitting: Podiatry

## 2017-11-22 DIAGNOSIS — M722 Plantar fascial fibromatosis: Secondary | ICD-10-CM | POA: Diagnosis not present

## 2017-11-22 MED ORDER — MELOXICAM 7.5 MG PO TABS: 7.5000 mg | ORAL_TABLET | Freq: Every day | ORAL | 0 refills | Status: DC

## 2017-11-23 NOTE — Progress Notes (Signed)
  Subjective:  Patient ID: [name], female    DOB: September 19, 1946,  MRN: [id number]  Chief Complaint  Patient presents with  . Plantar Fasciitis    F/U R. plantar fasciitis. Pt. complaince w/ stretching Pt. stated," it's a little better, but still having pain;5/10 sharp."   71 y.o. female returns for the above complaint.  There is doing a little better still having 5 out of 10 pain.  States that she is trying to do stretching. States the injection she got last time gave only a little bit of relief  Objective:  There were no vitals filed for this visit. General AA&O x3. Normal mood and affect.  Vascular Pedal pulses palpable.  Neurologic Epicritic sensation grossly intact.  Dermatologic No open lesions. Skin normal texture and turgor.  Orthopedic: Pain to palpation right medial heel tuber   Assessment & Plan:  Patient was evaluated and treated and all questions answered.  Plantar fasciitis right -Repeat injection as below -Fascial brace dispensed -Follow-up in 3 weeks for recheck -Rx low dose Meloxicam for 2 weeks.  Procedure: Injection Tendon/Ligament Location: Right plantar fascia at the glabrous junction; medial approach. Skin Prep: Alcohol. Injectate: 1 cc 0.5% marcaine plain, 1 cc dexamethasone phosphate, 0.5 cc kenalog 10. Disposition: Patient tolerated procedure well. Injection site dressed with a band-aid.  Return in about 3 weeks (around 12/13/2017) for Plantar fasciitis.

## 2017-12-13 ENCOUNTER — Ambulatory Visit: Payer: Medicare Other | Admitting: Podiatry

## 2017-12-13 DIAGNOSIS — M722 Plantar fascial fibromatosis: Secondary | ICD-10-CM

## 2017-12-24 NOTE — Progress Notes (Signed)
  Subjective:  Patient ID: [name], female    DOB: 1946-07-11,  MRN: [id number]  No chief complaint on file.  71 y.o. female returns for the above complaint.  Reports somewhat improved.  Has been using plantar fascial brace  Objective:  There were no vitals filed for this visit. General AA&O x3. Normal mood and affect.  Vascular Pedal pulses palpable.  Neurologic Epicritic sensation grossly intact.  Dermatologic No open lesions. Skin normal texture and turgor.  Orthopedic:  Pain to palpation right medial calcaneal tuber   Assessment & Plan:  Patient was evaluated and treated and all questions answered.  Plantar fasciitis -No injection today. -Continued icing and stretching recommended.  Follow-up in 4 weeks for recheck  Return in about 4 weeks (around 01/10/2018) for plantar fasciitis.

## 2018-01-10 ENCOUNTER — Encounter: Payer: Self-pay | Admitting: Podiatry

## 2018-01-10 ENCOUNTER — Ambulatory Visit: Payer: Medicare Other | Admitting: Podiatry

## 2018-01-10 ENCOUNTER — Telehealth: Payer: Self-pay | Admitting: *Deleted

## 2018-01-10 DIAGNOSIS — M722 Plantar fascial fibromatosis: Secondary | ICD-10-CM | POA: Diagnosis not present

## 2018-01-10 MED ORDER — MELOXICAM 7.5 MG PO TABS: 7.5000 mg | ORAL_TABLET | Freq: Every day | ORAL | 0 refills | Status: DC

## 2018-01-10 NOTE — Telephone Encounter (Signed)
-----   Message from Evelina Bucy, DPM sent at 01/10/2018 11:05 AM EST ----- Val,  Can we send a referral for PT? Patient [facility] a [facility] location [facility] through before near [place] in [place]. She'd like to go there.  ThankS!

## 2018-01-10 NOTE — Telephone Encounter (Signed)
I spoke with [facility] and she states they are located on [address] in [place] and they did accept faxed PT referrals. Faxed referral to [facility] PT and informed pt of their contact number.

## 2018-01-10 NOTE — Progress Notes (Signed)
  Subjective:  Patient ID: [name], female    DOB: 03/10/1946,  MRN: [id number]  Chief Complaint  Patient presents with  . Plantar Fasciitis    4 WKS PLANTAR FASC RIGHT - pt stated "right foot feels good but without fascial brace foot begins to hurt"    72 y.o. female returns for the above complaint.  States that the right foot feels good without fascial brace it starts hurting.  Has been wearing the fascial brace daily.  Objective:  There were no vitals filed for this visit. General AA&O x3. Normal mood and affect.  Vascular Pedal pulses palpable.  Neurologic Epicritic sensation grossly intact.  Dermatologic No open lesions. Skin normal texture and turgor.  Orthopedic: Pain to palpation R medial band of the plantar fascia.   Assessment & Plan:  Patient was evaluated and treated and all questions answered.  Plantar Fasciitis, R -Refer to PT due to recalcitrant symptoms. -Discussed possible surgical intervention in the future should pain persist. -Rx Meloxicam.  No Follow-up on file.

## 2018-01-22 ENCOUNTER — Other Ambulatory Visit: Payer: Self-pay

## 2018-01-22 ENCOUNTER — Ambulatory Visit (HOSPITAL_COMMUNITY): Payer: Medicare Other | Attending: Podiatry

## 2018-01-22 ENCOUNTER — Encounter (HOSPITAL_COMMUNITY): Payer: Self-pay

## 2018-01-22 DIAGNOSIS — M25672 Stiffness of left ankle, not elsewhere classified: Secondary | ICD-10-CM | POA: Diagnosis present

## 2018-01-22 DIAGNOSIS — R2689 Other abnormalities of gait and mobility: Secondary | ICD-10-CM | POA: Insufficient documentation

## 2018-01-22 DIAGNOSIS — M6281 Muscle weakness (generalized): Secondary | ICD-10-CM | POA: Insufficient documentation

## 2018-01-22 DIAGNOSIS — M25671 Stiffness of right ankle, not elsewhere classified: Secondary | ICD-10-CM | POA: Insufficient documentation

## 2018-01-22 DIAGNOSIS — M79671 Pain in right foot: Secondary | ICD-10-CM | POA: Insufficient documentation

## 2018-01-22 NOTE — Therapy (Addendum)
[place], [place], [address] Phone: [phone]   Fax:  [phone]  Physical Therapy Evaluation/Discharge Summary  Patient Details  Name: [name] MRN: [id number] Date of Birth: December 09, 1946 Referring Provider: Hardie Pulley, MD   PHYSICAL THERAPY DISCHARGE SUMMARY  Visits from Start of Care: 1  Current functional level related to goals / functional outcomes: Patient has switched primary MD's and she no longer believes she needs physical therapy and would like to be discharged.   Remaining deficits: See below evaluation details.   Education / Equipment:  Patient was educated on benefits of physical therapy at initial evaluation.  Plan: Patient agrees to discharge.  Patient goals were not met. Patient is being discharged due to the patient's request.  ?????     Kipp Brood, PT, DPT Physical Therapist with [facility]  02/20/2018 8:47 AM     Encounter Date: 01/22/2018  PT End of Session - 01/22/18 1038    Visit Number  1    Number of Visits  7    Date for PT Re-Evaluation  02/12/18    Authorization Type  [employer] Medicare    Authorization Time Period  01/22/18 - 03/05/18    PT Start Time  0817    PT Stop Time  0906    PT Time Calculation (min)  49 min    Activity Tolerance  Patient tolerated treatment well;No increased pain    Behavior During Therapy  WFL for tasks assessed/performed       Past Medical History:  Diagnosis Date  . Allergy    SEASONAL-flonase daily as well as Claritin  . Anemia    takes Ferrous Sulfate daily  . Anxiety    takes Celexa every evening  . Arthritis    BACK/KNEE  . Cataract    IMMATURE AND ON LEFT EYE SHE THINKS  . Chronic back pain   . Chronic back pain    L1 FRACTURE  . Depression    takes Wellbutrin daily  . Diabetes mellitus without complication ([place])    type II  . Dyspnea    with exertion  . Fibromyalgia   . GERD  (gastroesophageal reflux disease)    takes Omeprazole daily and Zantac at bedtime  . H/O hiatal hernia   . History of colon polyps   . History of shingles   . Hyperlipidemia    takes Pravastatin daily as well as Questran  . Hypertension    takes Diovan daily  . Insomnia    takes trazodone nightly  . Joint pain   . Muscle spasm    takes Robaxin daily as needed  . Nausea    takes Zofran daily as needed  . Nocturia   . Numbness and tingling    LEGS  . Osteoporosis    takes fosamax  . Sleep apnea    CPAP-SLEEP STUDY DONE > 5 YRS AGO  . Thyroid disease    takes Synthroid daily    Past Surgical History:  Procedure Laterality Date  . ABDOMINAL HYSTERECTOMY    . CARDIAC CATHETERIZATION    . CARPAL TUNNEL RELEASE     right and lt  . CHOLECYSTECTOMY    . COLONOSCOPY    . COLONOSCOPY    . EYE SURGERY Bilateral    Cataract with lens  . FOOT SURGERY Bilateral   . FRACTURE SURGERY     rt hand  . FRACTURE SURGERY     lr  wrist  . HAND SURGERY Right 06/1985  . HAND SURGERY Left   . KNEE ARTHROSCOPY Left   . KYPHOPLASTY N/A 12/21/2014   Procedure: KYPHOPLASTY LUMBAR ONE;  Surgeon: Newman Pies, MD;  Location: [facility];  Service: Neurosurgery;  Laterality: N/A;  L1 Kyphoplasty  . ORIF FINGER FRACTURE  04/11/2012   Procedure: OPEN REDUCTION INTERNAL FIXATION (ORIF) METACARPAL (FINGER) FRACTURE;  Surgeon: Ninetta Lights, MD;  Location: [facility];  Service: Orthopedics;  Laterality: Left;  orif left hand 4th metacarpal   . 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 RECTOCELE REPAIR    . SHOULDER ARTHROSCOPY WITH DISTAL CLAVICLE RESECTION Right 05/20/2015   Procedure: SHOULDER ARTHROSCOPY WITH DISTAL CLAVICLE RESECTION DEBRIDEMENT PARTIAL ROTATOR CUFF LABRAL TEAR REMOVAL LOOSE BODIES;  Surgeon: Kathryne Hitch, MD;  Location: [facility];  Service: Orthopedics;  Laterality: Right;  . TONSILLECTOMY    . TOTAL HIP ARTHROPLASTY Right  10/18/2016   Procedure: TOTAL HIP ARTHROPLASTY ANTERIOR APPROACH;  Surgeon: Ninetta Lights, MD;  Location: [facility];  Service: Orthopedics;  Laterality: Right;  . UPPER GASTROINTESTINAL ENDOSCOPY  02/2013  . WRIST SURGERY Left     There were no vitals filed for this visit.   Subjective Assessment - 01/22/18 0824    Subjective  Patient reports heel pain began 2-3 months ago and denies any traumatic event that may have caused the onset. She reports she went to her podiatrist because she thought she had a heel spur. Dr. March Rummage gave her an injection in her right foot and took an x-ray at her request. He found there is a heel spur present on her right foot. She has never had foot/heel pain before this episode and denies rolling her ankle. She has had 3 shots since her first visit in October: one in October, one in November, and one in December. He then referred her to physical therapy. She reports he gave her a brace and that this provides some relief so she wears it if her foot starts hurting more. She states it generally hurts worse at the end of the day compared to the morning.  She denies pain currently but reports it gets up to an 8/10 at worst.     Pertinent History  R THA in october of 2016, broken wrist before (both), Osteroporosis (brittle)     Limitations  Standing;Walking;House hold activities    How long can you sit comfortably?  unlimited    How long can you stand comfortably?  at least a couple of hours    How long can you walk comfortably?  about 1 hour    Diagnostic tests  x-ray on right foot (spur)    Patient Stated Goals  stop hurting, and be able to walk longer without pain    Currently in Pain?  No/denies    Multiple Pain Sites  No         [facility] PT Assessment - 01/22/18 0001      Assessment   Medical Diagnosis  Right Plantar Fasciitis    Referring Provider  Hardie Pulley, MD    Onset Date/Surgical Date  10/22/17 approximate    Hand Dominance  Right    Next MD Visit   02/06/2018    Prior Therapy  yes here for back and hip      Precautions   Precautions  None      Restrictions   Weight Bearing Restrictions  No  Balance Screen   Has the patient fallen in the past 6 months  No    Has the patient had a decrease in activity level because of a fear of falling?   Yes    Is the patient reluctant to leave their home because of a fear of falling?   No      Home Film/video editor residence    Living Arrangements  Spouse/significant other    Available Help at Discharge  Family    Type of [place] to enter;Level entry    Entrance Stairs-Number of Steps  5    Entrance Stairs-Rails  Can reach both    [place]  One level    [place] - 2 wheels;Cane - quad;Crutches;Grab bars - toilet;Grab bars - tub/shower;Shower seat;Toilet riser    Additional Comments  level entry at back door; ramp off the deck      Prior Function   Level of Independence  Independent;Independent with basic ADLs    Vocation  Retired    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    Leisure  walking for up to 30 minutes when the weather is nice, going to church, [employer], crafts in her home      Cognition   Overall Cognitive Status  Within Functional Limits for tasks assessed      Observation/Other Assessments   Focus on Therapeutic Outcomes (FOTO)   44% limited      Sensation   Light Touch  Appears Intact      Functional Tests   Functional tests  Step down;Squat;Single leg stance      Squat   Comments  decreased squat depth, decreased hip hinge, decreased bil ankle dorsiflexion      Step Down   Comments  6 inch step: unable to perform due to muscle weakness      Single Leg Stance   Comments  Bil LE: 0-2 seconds      Posture/Postural Control   Posture/Postural Control  Postural limitations    Postural Limitations  Rounded Shoulders;Forward head       ROM / Strength   AROM / PROM / Strength  AROM;Strength      AROM   Overall AROM Comments  Hip Extension limited to ~ 5 degress bilaterally    Right Ankle Dorsiflexion  0    Right Ankle Plantar Flexion  40    Right Ankle Inversion  34 pain    Right Ankle Eversion  22 pain    Left Ankle Dorsiflexion  0    Left Ankle Plantar Flexion  45    Left Ankle Inversion  40    Left Ankle Eversion  55      Strength   Right Hip Flexion  4-/5    Right Hip Extension  3+/5    Right Hip ABduction  3+/5    Left Hip Flexion  4/5    Left Hip Extension  3+/5    Left Hip ABduction  3+/5    Right/Left Knee  Right;Left    Right Knee Flexion  5/5    Right Knee Extension  5/5    Left Knee Flexion  5/5    Left Knee Extension  5/5    Right/Left Ankle  Right;Left    Right Ankle Dorsiflexion  5/5    Right Ankle Plantar Flexion  2+/5 < 10  reps  single limb heel raise    Left Ankle Dorsiflexion  5/5    Left Ankle Plantar Flexion  2+/5 < 10 reps  single limb heel raise      Palpation   Palpation comment  pain along lateral plantar surface and mid plantar heel region of right foot, no tenderness to palpation alogn spirng ligament, medial arch of right foot, or tarsel tunnel      Special Tests    Special Tests  Ankle/Foot Special Tests;Foot Alignment    Ankle/Foot Special Tests   Tinel's Test - post tibialis;Great Toe Extension Test    Foot Alignment  other;other2      Tinel's test - Post Tibialis    Findings  Positive    Side  Right    Comments  mild pain sensation with tinels      Great Toe Extension Test    Comments  limited Bil feet: ~ 40 degrees in standing, with windlass maneuver intact      other    Comment  Patient in WB: foot flat unable to maintain subtalar neutral position when placed there manually for Bil LE      other   Comments  Talocrural/Subtalar Joint Mobility: Bil LE hypomobile      Ambulation/Gait   Ambulation/Gait  Yes    Ambulation/Gait Assistance  7: Independent     Ambulation Distance (Feet)  404 Feet # MWT    Gait Pattern  Step-through pattern;Decreased stance time - right;Decreased step length - left;Decreased stride length;Decreased hip/knee flexion - right;Decreased hip/knee flexion - left;Decreased dorsiflexion - right;Decreased dorsiflexion - left;Decreased weight shift to right;Trendelenburg;Antalgic;Trunk flexed    Ambulation Surface  Level    Gait velocity  0.68 m/s    Gait Comments  Patient with antalgic gait increasing throughout 3 MWT. Patient required stop break at ~ 2:05 for ~ 25 seconds due to pain. Patient with notably increased respiratory rate however denies SOB when asked.        Objective measurements completed on examination: See above findings.     PT Education - 01/22/18 1036    Education provided  Yes    Education Details  Patient educated on exam findings and appropriate POC. Discussed frequency and timeline that is manageable for patient.     Person(s) Educated  Patient    Methods  Explanation    Comprehension  Verbalized understanding       PT Short Term Goals - 01/22/18 1102      PT SHORT TERM GOAL #1   Title  Patient will be independent with HEP and demosntrate proper form wtih exercises to improve functional strength and activity toelrance.    Time  3    Period  Weeks    Status  New    Target Date  02/12/18      PT SHORT TERM GOAL #2   Title  Patient to be able to ambulate at least 100 feet greater than baseline during 3MWT wihtout requiring a stop break in order to show improved mobility and community access at gait velocity of 1.0 m/s to demosntrate reduced fall risk.     Time  3    Period  Weeks    Status  New      PT SHORT TERM GOAL #3   Title  Patient will perform SLS for 7 seconds or greater Bilateral LE to improve dynamic balance during gait and stair ambulation.     Time  3    Period  Weeks    Status  New        PT Long Term Goals - 01/22/18 1058      PT LONG TERM GOAL #1   Title  Patient to  demonstrate increased functional strength by 1 grade improvement wtih MMT for all limited groups in order to improve mobility and balance skills     Time  6    Period  Weeks    Status  New    Target Date  03/05/18      PT LONG TERM GOAL #2   Title  Patient to be able to ambulate at least 590 feet during 3MWT in order to show improved mobility and community access at gait velocity of 1.0 m/s to demosntrate reduced fall risk.     Time  6    Period  Weeks    Status  New      PT LONG TERM GOAL #3   Title  Patient will improve bilateral ankle ROM to be within normal limits for all planes with no pain to demonstrate improved functional motion for improved quality with gait and functional mobility.     Time  6    Period  Weeks    Status  New      PT LONG TERM GOAL #4   Title  Patient will perform SLS for 15 seconds or greater Bilateral LE to improve dynamic balance during gait and stair ambulation.     Time  6    Period  Weeks    Status  New        Plan - 01/22/18 1039    Clinical Impression Statement  Ms. Mcguirk arrived for initial PT evaluation today for right foot pain that began approximately 3 months ago. She presents with decreased ankle ROM and hypomobility of her subtalar and talocrural joint bilaterally, reduced muscle length of bilateral LE, decreased balance, decreased bilateral LE strength, and abnormalities of gait and mobility secondary to pain and decreased activity tolerance. She will benefit from skilled PT services to address current impairments and improve functional mobility to achieve goals and improve QOL    History and Personal Factors relevant to plan of care:  Right THA, bilateral foot surgeries to remove corns and "knots"    Clinical Presentation  Stable    Clinical Presentation due to:  FOTO, 3 MWT, MMT, SLS, Clinical judement,     Clinical Decision Making  Low    Rehab Potential  Fair    Clinical Impairments Affecting Rehab Potential  (-) financial restriction,  (+) motivated, (+) positive experience with therapy previously    PT Frequency  1x / week    PT Duration  6 weeks    PT Treatment/Interventions  ADLs/Self Care Home Management;Cryotherapy;Electrical Stimulation;Moist Heat;Gait training;Stair training;Functional mobility training;Therapeutic activities;Therapeutic exercise;Balance training;Neuromuscular re-education;Patient/family education;Manual techniques;Passive range of motion;Energy conservation;Taping    PT Next Visit Plan  Review Eval and goals. Test MMT for Bil LE ankle inversion/eversion.     PT Home Exercise Plan  Initiate next session: towel crunch, towel inversion/eversion, gastroc/soleus stretch    Recommended Other Services  none    Consulted and Agree with Plan of Care  Patient       Patient will benefit from skilled therapeutic intervention in order to improve the following deficits and impairments:  Abnormal gait, Improper body mechanics, Pain, Decreased mobility, Postural dysfunction, Decreased activity tolerance, Decreased endurance, Decreased range of motion, Decreased strength, Hypomobility, Decreased balance, Difficulty walking, Impaired flexibility  Visit Diagnosis:  Pain in right foot  Decreased range of motion of both ankles  Muscle weakness (generalized)  Other abnormalities of gait and mobility     Problem List Patient Active Problem List   Diagnosis Date Noted  . S/P total hip arthroplasty 10/18/2016  . Hypersomnia with sleep apnea 07/20/2015  . Hypersomnia, persistent 07/20/2015  . OSA (obstructive sleep apnea) 07/20/2015  . Lumbar compression fracture ([place]) 12/21/2014  . Morbid obesity ([place]) 03/07/2013  . Obstructive sleep apnea 03/07/2013  . Fibromyalgia 03/07/2013  . Other and unspecified hyperlipidemia 03/07/2013  . Hx of gastritis 03/07/2013  . S/P cholecystectomy 03/07/2013  . Chest pain 01/07/2013    Kipp Brood, PT, DPT Physical Therapist with [facility]  01/22/2018 12:28 PM    [place], [place], [address] Phone: [phone]   Fax:  [phone]  Name: [name] MRN: [id number] Date of Birth: 09/20/1946

## 2018-01-28 ENCOUNTER — Telehealth (HOSPITAL_COMMUNITY): Payer: Self-pay | Admitting: Physical Therapy

## 2018-01-28 NOTE — Telephone Encounter (Signed)
Her foot is swollen and she can not stand to touch it, there's no way she can come tomorrow

## 2018-01-29 ENCOUNTER — Ambulatory Visit (HOSPITAL_COMMUNITY): Payer: Medicare Other | Admitting: Physical Therapy

## 2018-02-04 ENCOUNTER — Telehealth (HOSPITAL_COMMUNITY): Payer: Self-pay | Admitting: Physical Therapy

## 2018-02-04 NOTE — Telephone Encounter (Signed)
Patient cancel her appt she have a MD appt tomorrow

## 2018-02-05 ENCOUNTER — Ambulatory Visit (HOSPITAL_COMMUNITY): Payer: Medicare Other | Admitting: Physical Therapy

## 2018-02-06 ENCOUNTER — Ambulatory Visit: Payer: Medicare Other | Admitting: Podiatry

## 2018-02-12 ENCOUNTER — Ambulatory Visit (HOSPITAL_COMMUNITY): Payer: Medicare Other | Admitting: Physical Therapy

## 2018-02-12 ENCOUNTER — Telehealth (HOSPITAL_COMMUNITY): Payer: Self-pay | Admitting: Internal Medicine

## 2018-02-12 NOTE — Telephone Encounter (Signed)
02/12/18  Pt cx via the phone tree

## 2018-02-18 ENCOUNTER — Telehealth (HOSPITAL_COMMUNITY): Payer: Self-pay

## 2018-02-18 NOTE — Telephone Encounter (Signed)
She changed MD's and he doesn't think she needs PT, please d/c.

## 2018-02-19 ENCOUNTER — Ambulatory Visit (HOSPITAL_COMMUNITY): Payer: Medicare Other | Admitting: Physical Therapy

## 2018-02-26 ENCOUNTER — Encounter (HOSPITAL_COMMUNITY): Payer: Self-pay

## 2018-03-05 ENCOUNTER — Ambulatory Visit (HOSPITAL_COMMUNITY): Payer: Self-pay

## 2018-03-18 ENCOUNTER — Telehealth (HOSPITAL_COMMUNITY): Payer: Self-pay | Admitting: Internal Medicine

## 2018-03-18 ENCOUNTER — Telehealth (HOSPITAL_COMMUNITY): Payer: Self-pay

## 2018-03-18 NOTE — Telephone Encounter (Signed)
03/18/18  I got all of the necessary insurance information and verified and explained all to patient.  I didn't enter the insurance information into Epic as I should have so I emailed Annye Asa to ask if she could enter and have resubmitted to [employer] as I couldn't enter the information since patient was no longer coming to PT.  Debbie responded back to say that she had corrected this with the insurance and initiated this billing.  I called Ms. Rabenold back and let her know about my error and offered my apologies.  I also informed her of what had been done today and she wanted to know if I could just keep her updated on the status of the bill and I told her that I would.  I made a note in my notebook for myself to watch and look for insurance payment and I told her I would call her to let her know.

## 2018-03-18 NOTE — Telephone Encounter (Signed)
Tammy from [facility] called to inquire about patient had no ins attached for billing but ins was called and went over with pt. Leafy Ro will correct and add Ins and call pt back for mistake -01/22/2017--NF 03/18/2018

## 2018-04-10 ENCOUNTER — Encounter: Payer: Self-pay | Admitting: Gastroenterology

## 2018-05-27 ENCOUNTER — Ambulatory Visit: Payer: Medicare Other | Admitting: Gastroenterology

## 2018-05-27 ENCOUNTER — Encounter: Payer: Self-pay | Admitting: Gastroenterology

## 2018-05-27 VITALS — BP 136/86 | HR 80 | Ht 59.0 in | Wt 226.2 lb

## 2018-05-27 DIAGNOSIS — R195 Other fecal abnormalities: Secondary | ICD-10-CM | POA: Diagnosis not present

## 2018-05-27 MED ORDER — PEG 3350-KCL-NA BICARB-NACL 420 G PO SOLR: 4000.0000 mL | ORAL | 0 refills | Status: DC

## 2018-05-27 NOTE — Progress Notes (Signed)
Review of pertinent gastrointestinal problems: 1. Routine risk for CRC: Colonoscopy November 2004 by Dr. Lajoyce Corners; indications Hemoccult-positive stool, findings normal colon. Colonoscopy March 2006 Dr.  Lajoyce Corners; indications "question of polyps" findings normal colon, no polyps. underwent stool testing by PCP in 2014 as part of routine physical and was positive so repeat colonoscopy 02/2013 was normal, no polyps.  She was recommended to have repeat screening in 10 years.    HPI: This is a very pleasant 72 year old woman who was referred to me by Prince Solian, MD  to evaluate Hemoccult positive stool.    Chief complaint is Hemoccult positive stool  As a part of every annual physical she is asked to turn in stool for Hemoccult testing.  She has been doing this every year for many years.  This year her Hemoccult test was positive for stool.  Her last colonoscopy was a little over 5 years ago.  It was perfectly normal.  At that time I recommended no further colon cancer screening for 10 years.  Weight has benstable.  Since GB surgery she's had loose stools.  In the 1970s.  She takes imodium and it works usually but she does not take it on a routine basis  Lower abdominal pains bilaterally, about a month.  Somewhat related to her bowels.   Old Data Reviewed:     Review of systems: Pertinent positive and negative review of systems were noted in the above HPI section. All other review negative.   Past Medical History:  Diagnosis Date  . Allergy    SEASONAL-flonase daily as well as Claritin  . Anemia    takes Ferrous Sulfate daily  . Anxiety    takes Celexa every evening  . Arthritis    BACK/KNEE  . Cataract    IMMATURE AND ON LEFT EYE SHE THINKS  . Chronic back pain   . Chronic back pain    L1 FRACTURE  . Depression    takes Wellbutrin daily  . Diabetes mellitus without complication ([place])    type II  . Dyspnea    with exertion  . Fibromyalgia   . GERD (gastroesophageal reflux  disease)    takes Omeprazole daily and Zantac at bedtime  . H/O hiatal hernia   . History of colon polyps   . History of shingles   . Hyperlipidemia    takes Pravastatin daily as well as Questran  . Hypertension    takes Diovan daily  . Insomnia    takes trazodone nightly  . Joint pain   . Muscle spasm    takes Robaxin daily as needed  . Nausea    takes Zofran daily as needed  . Nocturia   . Numbness and tingling    LEGS  . Osteoporosis    takes fosamax  . Sleep apnea    CPAP-SLEEP STUDY DONE > 5 YRS AGO  . Thyroid disease    takes Synthroid daily    Past Surgical History:  Procedure Laterality Date  . ABDOMINAL HYSTERECTOMY    . CARDIAC CATHETERIZATION    . CARPAL TUNNEL RELEASE     right and lt  . CHOLECYSTECTOMY    . COLONOSCOPY    . COLONOSCOPY    . EYE SURGERY Bilateral    Cataract with lens  . FOOT SURGERY Bilateral   . FRACTURE SURGERY     rt hand  . FRACTURE SURGERY     lr wrist  . HAND SURGERY Right 06/1985  . HAND SURGERY Left   .  KNEE ARTHROSCOPY Left   . KYPHOPLASTY N/A 12/21/2014   Procedure: KYPHOPLASTY LUMBAR ONE;  Surgeon: Newman Pies, MD;  Location: [facility];  Service: Neurosurgery;  Laterality: N/A;  L1 Kyphoplasty  . ORIF FINGER FRACTURE  04/11/2012   Procedure: OPEN REDUCTION INTERNAL FIXATION (ORIF) METACARPAL (FINGER) FRACTURE;  Surgeon: Ninetta Lights, MD;  Location: [facility];  Service: Orthopedics;  Laterality: Left;  orif left hand 4th metacarpal   . 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 RECTOCELE REPAIR    . SHOULDER ARTHROSCOPY WITH DISTAL CLAVICLE RESECTION Right 05/20/2015   Procedure: SHOULDER ARTHROSCOPY WITH DISTAL CLAVICLE RESECTION DEBRIDEMENT PARTIAL ROTATOR CUFF LABRAL TEAR REMOVAL LOOSE BODIES;  Surgeon: Kathryne Hitch, MD;  Location: [facility];  Service: Orthopedics;  Laterality: Right;  . TONSILLECTOMY    . TOTAL HIP ARTHROPLASTY Right 10/18/2016   Procedure: TOTAL  HIP ARTHROPLASTY ANTERIOR APPROACH;  Surgeon: Ninetta Lights, MD;  Location: [facility];  Service: Orthopedics;  Laterality: Right;  . UPPER GASTROINTESTINAL ENDOSCOPY  02/2013  . WRIST SURGERY Left     Current Outpatient Medications  Medication Sig Dispense Refill  . alendronate (FOSAMAX) 70 MG tablet Take 70 mg by mouth once a week. Take with a full glass of water on an empty stomach.    Marland Kitchen aspirin EC 81 MG tablet Take 81 mg by mouth daily.    . Biotin 1000 MCG tablet Take 1,000 mcg by mouth daily.     Marland Kitchen buPROPion (WELLBUTRIN SR) 150 MG 12 hr tablet Take 150 mg by mouth daily.    . Calcium Carbonate-Vit D-Min (CALCIUM 1200) 1200-1000 MG-UNIT CHEW Chew 1 tablet by mouth daily.    . citalopram (CELEXA) 40 MG tablet Take 40 mg by mouth every evening.    . Cyanocobalamin (VITAMIN B-12 PO) Take 1 tablet by mouth daily.    . diphenoxylate-atropine (LOMOTIL) 2.5-0.025 MG per tablet TAKE 1 TABLET BY MOUTH AFTER EACH LOOSE BOWEL MOVEMENT AS NEEDED... UP TO 8 TABLETS A DAY  2  . fluticasone (FLONASE) 50 MCG/ACT nasal spray Place 2 sprays into the nose daily.    Marland Kitchen levothyroxine (SYNTHROID, LEVOTHROID) 112 MCG tablet Take 112 mcg by mouth daily before breakfast.    . loratadine (CLARITIN) 10 MG tablet Take 10 mg by mouth daily.    . metFORMIN (GLUCOPHAGE-XR) 500 MG 24 hr tablet Take 500 mg by mouth every evening.  1  . Omega-3 Fatty Acids (FISH OIL) 1000 MG CAPS Take 1,000 mg by mouth daily.    Marland Kitchen omeprazole (PRILOSEC) 40 MG capsule Take 1 capsule (40 mg total) by mouth daily before breakfast. (Patient taking differently: Take 40 mg by mouth daily as needed (heartburn). ) 30 capsule 11  . ondansetron (ZOFRAN) 4 MG tablet Take 1 tablet (4 mg total) by mouth every 6 (six) hours as needed for nausea. (Patient taking differently: Take 4 mg by mouth as needed for nausea. ) 20 tablet 0  . oxyCODONE-acetaminophen (PERCOCET/ROXICET) 5-325 MG tablet Take 1 tablet by mouth every 4 (four) hours as needed for moderate pain  or severe pain. 60 tablet 0  . potassium chloride SA (K-DUR,KLOR-CON) 20 MEQ tablet Take 20 mEq by mouth 2 (two) times daily.    . pravastatin (PRAVACHOL) 20 MG tablet Take 20 mg by mouth at bedtime.     . traZODone (DESYREL) 50 MG tablet Take 50 mg by mouth at bedtime.    . triamcinolone cream (KENALOG) 0.1 % Apply  1 application topically daily as needed (rash).     . valsartan-hydrochlorothiazide (DIOVAN-HCT) 80-12.5 MG per tablet Take 1 tablet by mouth daily.     No current facility-administered medications for this visit.     Allergies as of 05/27/2018 - Review Complete 05/27/2018  Allergen Reaction Noted  . Penicillins Itching 04/06/2012  . Sulfa antibiotics Swelling 04/06/2012    Family History  Problem Relation Age of Onset  . Hypertension Mother   . Diabetes Mother   . Cancer Maternal Grandfather        back  . Cancer Paternal Uncle   . Lung cancer Paternal Aunt   . Lung cancer Sister   . Heart attack Brother        x 2    Social History   Socioeconomic History  . Marital status: Married    Spouse name: [name]  . Number of children: 5  . Years of education: Not on file  . Highest education level: Not on file  Occupational History  . Occupation: retired  Scientific laboratory technician  . Financial resource strain: Not on file  . Food insecurity:    Worry: Not on file    Inability: Not on file  . Transportation needs:    Medical: Not on file    Non-medical: Not on file  Tobacco Use  . Smoking status: Former Research scientist (life sciences)  . Smokeless tobacco: Never Used  . Tobacco comment: as a young erson, off and on , never had a habit  Substance and Sexual Activity  . Alcohol use: No  . Drug use: No  . Sexual activity: Yes    Birth control/protection: Surgical  Lifestyle  . Physical activity:    Days per week: Not on file    Minutes per session: Not on file  . Stress: Not on file  Relationships  . Social connections:    Talks on phone: Not on file    Gets together: Not on file     Attends religious service: Not on file    Active member of club or organization: Not on file    Attends meetings of clubs or organizations: Not on file    Relationship status: Not on file  . Intimate partner violence:    Fear of current or ex partner: Not on file    Emotionally abused: Not on file    Physically abused: Not on file    Forced sexual activity: Not on file  Other Topics Concern  . Not on file  Social History Narrative   Drinks 1 cup of coffee daily. 1 diet pepsi.     Physical Exam: Ht 4\' 11"  (1.499 m) Comment: height measured without shoes  Wt 226 lb 4 oz (102.6 kg)   BMI 45.70 kg/m  Constitutional: generally well-appearing Psychiatric: alert and oriented x3 Eyes: extraocular movements intact Mouth: oral pharynx moist, no lesions Neck: supple no lymphadenopathy Cardiovascular: heart regular rate and rhythm Lungs: clear to auscultation bilaterally Abdomen: soft, nontender, nondistended, no obvious ascites, no peritoneal signs, normal bowel sounds Extremities: no lower extremity edema bilaterally Skin: no lesions on visible extremities   Assessment and plan: 72 y.o. female with Hemoccult positive stool  She is being overscreened for colon cancer.  It sounds like she has Hemoccult testing of her stool every year.  This year her Hemoccult testing was positive.  I explained to her that because of that I recommend a colonoscopy now to exclude significant neoplastic causes.  I told her in the future she should  politely decline annual stool Hemoccult testing.  She has chronic loose stools since her gallbladder was removed 4 years ago.  I recommended she try taking Imodium on a daily basis rather than just once in a while.  It seems to work for her very well.   Please see the "Patient Instructions" section for addition details about the plan.   Owens Loffler, MD [facility] 05/27/2018, 10:03 AM  Cc: Prince Solian, MD

## 2018-05-27 NOTE — Patient Instructions (Addendum)
You will be set up for a colonoscopy for hemocult + stool. You should decline future annual hemocult tests for colon cancer screening. Try taking a single Imodium pill every morning shortly after waking like it is a vitamin for your chronic loose stools.  Normal BMI (Body Mass Index- based on height and weight) is between 23 and 30. Your BMI today is Body mass index is 45.7 kg/m. Marland Kitchen Please consider follow up  regarding your BMI with your Primary Care Provider.

## 2018-06-21 ENCOUNTER — Other Ambulatory Visit: Payer: Self-pay | Admitting: Surgery

## 2018-06-21 DIAGNOSIS — M23301 Other meniscus derangements, unspecified lateral meniscus, left knee: Secondary | ICD-10-CM

## 2018-06-21 DIAGNOSIS — M25562 Pain in left knee: Secondary | ICD-10-CM

## 2018-07-08 ENCOUNTER — Ambulatory Visit
Admission: RE | Admit: 2018-07-08 | Discharge: 2018-07-08 | Disposition: A | Discharge status: Home / Self Care | Payer: Medicare Other | Source: Ambulatory Visit | Attending: Surgery | Admitting: Surgery

## 2018-07-08 DIAGNOSIS — M25562 Pain in left knee: Secondary | ICD-10-CM | POA: Diagnosis not present

## 2018-07-08 DIAGNOSIS — M1712 Unilateral primary osteoarthritis, left knee: Secondary | ICD-10-CM | POA: Diagnosis not present

## 2018-07-08 DIAGNOSIS — M7122 Synovial cyst of popliteal space [Baker], left knee: Secondary | ICD-10-CM | POA: Diagnosis not present

## 2018-07-08 DIAGNOSIS — M23301 Other meniscus derangements, unspecified lateral meniscus, left knee: Secondary | ICD-10-CM

## 2018-07-29 ENCOUNTER — Telehealth: Payer: Self-pay | Admitting: Gastroenterology

## 2018-07-29 NOTE — Telephone Encounter (Signed)
The pt states she has a question about nail polish, she was advised no red polish.

## 2018-07-31 ENCOUNTER — Ambulatory Visit (AMBULATORY_SURGERY_CENTER): Payer: Medicare Other | Admitting: Gastroenterology

## 2018-07-31 ENCOUNTER — Encounter: Payer: Self-pay | Admitting: Gastroenterology

## 2018-07-31 VITALS — BP 121/56 | HR 76 | Temp 98.0°F | Resp 18 | Ht 59.0 in | Wt 226.0 lb

## 2018-07-31 DIAGNOSIS — R195 Other fecal abnormalities: Secondary | ICD-10-CM

## 2018-07-31 DIAGNOSIS — D123 Benign neoplasm of transverse colon: Secondary | ICD-10-CM | POA: Diagnosis present

## 2018-07-31 MED ORDER — SODIUM CHLORIDE 0.9 % IV SOLN: 500.0000 mL | Freq: Once | INTRAVENOUS | Status: DC

## 2018-07-31 NOTE — Op Note (Signed)
[facility] Patient Name: [name] Procedure Date: 07/31/2018 9:28 AM MRN: [id number] Endoscopist: Milus Banister , MD Age: 72 Referring MD:  Date of Birth: 31-Jan-1946 Gender: Female Account #: [id number] Procedure:                Colonoscopy Indications:              Heme positive stool; Colonoscopy November 2004 by                            Dr. Lajoyce Corners; indications Hemoccult-positive stool,                            findings normal colon. Colonoscopy March 2006 Dr.                            Lajoyce Corners; indications "question of polyps" findings                            normal colon, no polyps. underwent stool testing by                            PCP in 2014 as part of routine physical and was                            positive so repeat colonoscopy 02/2013 was normal,                            no polyps. She was recommended to have repeat                            screening in 10 years Medicines:                Monitored Anesthesia Care Procedure:                Pre-Anesthesia Assessment:                           - Prior to the procedure, a History and Physical                            was performed, and patient medications and                            allergies were reviewed. The patient's tolerance of                            previous anesthesia was also reviewed. The risks                            and benefits of the procedure and the sedation                            options and risks were discussed with the patient.  All questions were answered, and informed consent                            was obtained. Prior Anticoagulants: The patient has                            taken no previous anticoagulant or antiplatelet                            agents. ASA Grade Assessment: II - A patient with                            mild systemic disease. After reviewing the risks                            and benefits, the patient was deemed in                             satisfactory condition to undergo the procedure.                           After obtaining informed consent, the colonoscope                            was passed under direct vision. Throughout the                            procedure, the patient's blood pressure, pulse, and                            oxygen saturations were monitored continuously. The                            Colonoscope was introduced through the anus and                            advanced to the the cecum, identified by                            appendiceal orifice and ileocecal valve. The                            colonoscopy was performed without difficulty. The                            patient tolerated the procedure well. The quality                            of the bowel preparation was good. The ileocecal                            valve, appendiceal orifice, and rectum were  photographed. Scope In: 9:33:42 AM Scope Out: 9:44:07 AM Scope Withdrawal Time: 0 hours 7 minutes 32 seconds  Total Procedure Duration: 0 hours 10 minutes 25 seconds  Findings:                 A 3 mm polyp was found in the transverse colon. The                            polyp was sessile. The polyp was removed with a                            cold snare. Resection and retrieval were complete.                           The exam was otherwise without abnormality on                            direct and retroflexion views. Complications:            No immediate complications. Estimated blood loss:                            None. Estimated Blood Loss:     Estimated blood loss: none. Impression:               - One 3 mm polyp in the transverse colon, removed                            with a cold snare. Resected and retrieved.                           - The examination was otherwise normal on direct                            and retroflexion views. Recommendation:           -  Patient has a contact number available for                            emergencies. The signs and symptoms of potential                            delayed complications were discussed with the                            patient. Return to normal activities tomorrow.                            Written discharge instructions were provided to the                            patient.                           - Resume previous diet.                           -  Continue present medications.                           You will receive a letter within 2-3 weeks with the                            pathology results and my final recommendations.                           If the polyp(s) is proven to be 'pre-cancerous' on                            pathology, you will need repeat colonoscopy in 5                            years. There is no need for colon cancer screening                            by any other method (including stool testing) ever                            again. Milus Banister, MD 07/31/2018 9:47:54 AM This report has been signed electronically.

## 2018-07-31 NOTE — Progress Notes (Signed)
Called to room to assist during endoscopic procedure.  Patient ID and intended procedure confirmed with present staff. Received instructions for my participation in the procedure from the performing physician.  

## 2018-07-31 NOTE — Progress Notes (Signed)
Pt's states no medical or surgical changes since previsit or office visit. 

## 2018-07-31 NOTE — Progress Notes (Signed)
To PACU, VSS. Report to [email or web address] 

## 2018-07-31 NOTE — Patient Instructions (Signed)
Thank you for allowing [place] to care for you today.  Await pathology results by mail, 2-3 weeks.  Resume diet and medications as before.     YOU HAD AN ENDOSCOPIC PROCEDURE TODAY AT [facility]:   Refer to the procedure report that was given to you for any specific questions about what was found during the examination.  If the procedure report does not answer your questions, please call your gastroenterologist to clarify.  If you requested that your care partner not be given the details of your procedure findings, then the procedure report has been included in a sealed envelope for you to review at your convenience later.  YOU SHOULD EXPECT: Some feelings of bloating in the abdomen. Passage of more gas than usual.  Walking can help get rid of the air that was put into your GI tract during the procedure and reduce the bloating. If you had a lower endoscopy (such as a colonoscopy or flexible sigmoidoscopy) you may notice spotting of blood in your stool or on the toilet paper. If you underwent a bowel prep for your procedure, you may not have a normal bowel movement for a few days.  Please Note:  You might notice some irritation and congestion in your nose or some drainage.  This is from the oxygen used during your procedure.  There is no need for concern and it should clear up in a day or so.  SYMPTOMS TO REPORT IMMEDIATELY:   Following lower endoscopy (colonoscopy or flexible sigmoidoscopy):  Excessive amounts of blood in the stool  Significant tenderness or worsening of abdominal pains  Swelling of the abdomen that is new, acute  Fever of 100F or higher   For urgent or emergent issues, a gastroenterologist can be reached at any hour by calling [phone].   DIET:  We do recommend a small meal at first, but then you may proceed to your regular diet.  Drink plenty of fluids but you should avoid alcoholic beverages for 24 hours.  ACTIVITY:  You should plan to take it  easy for the rest of today and you should NOT DRIVE or use heavy machinery until tomorrow (because of the sedation medicines used during the test).    FOLLOW UP: Our staff will call the number listed on your records the next business day following your procedure to check on you and address any questions or concerns that you may have regarding the information given to you following your procedure. If we do not reach you, we will leave a message.  However, if you are feeling well and you are not experiencing any problems, there is no need to return our call.  We will assume that you have returned to your regular daily activities without incident.  If any biopsies were taken you will be contacted by phone or by letter within the next 1-3 weeks.  Please call us at [phone] if you have not heard about the biopsies in 3 weeks.    SIGNATURES/CONFIDENTIALITY: You and/or your care partner have signed paperwork which will be entered into your electronic medical record.  These signatures attest to the fact that that the information above on your After Visit Summary has been reviewed and is understood.  Full responsibility of the confidentiality of this discharge information lies with you and/or your care-partner.

## 2018-08-01 ENCOUNTER — Telehealth: Payer: Self-pay

## 2018-08-01 NOTE — Telephone Encounter (Signed)
Left message

## 2018-08-01 NOTE — Telephone Encounter (Signed)
  Follow up Call-  Call back number 07/31/2018  Post procedure Call Back phone  # [phone]  Permission to leave phone message Yes  Some recent data might be hidden     Patient questions:  Do you have a fever, pain , or abdominal swelling? No. Pain Score  0 *  Have you tolerated food without any problems? Yes.    Have you been able to return to your normal activities? Yes.    Do you have any questions about your discharge instructions: Diet   No. Medications  No. Follow up visit  No.  Do you have questions or concerns about your Care? No.  Actions: * If pain score is 4 or above: No action needed, pain <4.

## 2018-08-12 ENCOUNTER — Encounter: Payer: Self-pay | Admitting: Gastroenterology

## 2019-03-17 DIAGNOSIS — Z6841 Body Mass Index (BMI) 40.0 and over, adult: Secondary | ICD-10-CM | POA: Insufficient documentation

## 2019-08-12 ENCOUNTER — Other Ambulatory Visit (HOSPITAL_COMMUNITY): Payer: Self-pay | Admitting: Internal Medicine

## 2019-08-12 ENCOUNTER — Other Ambulatory Visit: Payer: Self-pay | Admitting: Internal Medicine

## 2019-08-12 DIAGNOSIS — M5416 Radiculopathy, lumbar region: Secondary | ICD-10-CM

## 2019-08-12 DIAGNOSIS — M545 Low back pain, unspecified: Secondary | ICD-10-CM

## 2019-08-12 DIAGNOSIS — S32028A Other fracture of second lumbar vertebra, initial encounter for closed fracture: Secondary | ICD-10-CM

## 2019-08-14 ENCOUNTER — Other Ambulatory Visit: Payer: Self-pay

## 2019-08-14 ENCOUNTER — Ambulatory Visit (HOSPITAL_COMMUNITY)
Admission: RE | Admit: 2019-08-14 | Discharge: 2019-08-14 | Disposition: A | Discharge status: Home / Self Care | Payer: Medicare Other | Source: Ambulatory Visit | Attending: Internal Medicine | Admitting: Internal Medicine

## 2019-08-14 DIAGNOSIS — M545 Low back pain, unspecified: Secondary | ICD-10-CM

## 2019-08-14 DIAGNOSIS — M5416 Radiculopathy, lumbar region: Secondary | ICD-10-CM | POA: Diagnosis present

## 2019-08-14 DIAGNOSIS — S32028A Other fracture of second lumbar vertebra, initial encounter for closed fracture: Secondary | ICD-10-CM | POA: Diagnosis present

## 2019-11-26 ENCOUNTER — Encounter (HOSPITAL_COMMUNITY): Payer: Self-pay | Admitting: Emergency Medicine

## 2019-11-26 ENCOUNTER — Emergency Department (HOSPITAL_COMMUNITY)
Admission: EM | Admit: 2019-11-26 | Discharge: 2019-11-26 | Disposition: A | Discharge status: Home / Self Care | Payer: Medicare Other | Attending: Emergency Medicine | Admitting: Emergency Medicine

## 2019-11-26 ENCOUNTER — Other Ambulatory Visit: Payer: Self-pay

## 2019-11-26 ENCOUNTER — Emergency Department (HOSPITAL_COMMUNITY): Payer: Medicare Other

## 2019-11-26 DIAGNOSIS — K921 Melena: Secondary | ICD-10-CM | POA: Diagnosis not present

## 2019-11-26 DIAGNOSIS — Z7984 Long term (current) use of oral hypoglycemic drugs: Secondary | ICD-10-CM | POA: Diagnosis not present

## 2019-11-26 DIAGNOSIS — Z79899 Other long term (current) drug therapy: Secondary | ICD-10-CM | POA: Diagnosis not present

## 2019-11-26 DIAGNOSIS — R1084 Generalized abdominal pain: Secondary | ICD-10-CM | POA: Diagnosis not present

## 2019-11-26 DIAGNOSIS — E119 Type 2 diabetes mellitus without complications: Secondary | ICD-10-CM | POA: Diagnosis not present

## 2019-11-26 DIAGNOSIS — Z87891 Personal history of nicotine dependence: Secondary | ICD-10-CM | POA: Insufficient documentation

## 2019-11-26 DIAGNOSIS — Z96641 Presence of right artificial hip joint: Secondary | ICD-10-CM | POA: Insufficient documentation

## 2019-11-26 DIAGNOSIS — I10 Essential (primary) hypertension: Secondary | ICD-10-CM | POA: Insufficient documentation

## 2019-11-26 DIAGNOSIS — Z7982 Long term (current) use of aspirin: Secondary | ICD-10-CM | POA: Diagnosis not present

## 2019-11-26 DIAGNOSIS — R109 Unspecified abdominal pain: Secondary | ICD-10-CM | POA: Diagnosis present

## 2019-11-26 DIAGNOSIS — K529 Noninfective gastroenteritis and colitis, unspecified: Secondary | ICD-10-CM

## 2019-11-26 LAB — URINALYSIS, ROUTINE W REFLEX MICROSCOPIC
Bilirubin Urine: NEGATIVE
Glucose, UA: NEGATIVE mg/dL
Hgb urine dipstick: NEGATIVE
Ketones, ur: NEGATIVE mg/dL
Nitrite: NEGATIVE
Protein, ur: NEGATIVE mg/dL
Specific Gravity, Urine: 1.013 (ref 1.005–1.030)
pH: 6 (ref 5.0–8.0)

## 2019-11-26 LAB — COMPREHENSIVE METABOLIC PANEL
ALT: 19 U/L (ref 0–44)
AST: 27 U/L (ref 15–41)
Albumin: 3.3 g/dL — ABNORMAL LOW (ref 3.5–5.0)
Alkaline Phosphatase: 41 U/L (ref 38–126)
Anion gap: 11 (ref 5–15)
BUN: 27 mg/dL — ABNORMAL HIGH (ref 8–23)
CO2: 25 mmol/L (ref 22–32)
Calcium: 8.6 mg/dL — ABNORMAL LOW (ref 8.9–10.3)
Chloride: 102 mmol/L (ref 98–111)
Creatinine, Ser: 0.98 mg/dL (ref 0.44–1.00)
GFR calc Af Amer: >60 mL/min (ref >60)
GFR calc non Af Amer: 57 mL/min — ABNORMAL LOW (ref >60)
Glucose, Bld: 95 mg/dL (ref 70–99)
Potassium: 3.2 mmol/L — ABNORMAL LOW (ref 3.5–5.1)
Sodium: 138 mmol/L (ref 135–145)
Total Bilirubin: 1.3 mg/dL — ABNORMAL HIGH (ref 0.3–1.2)
Total Protein: 6.1 g/dL — ABNORMAL LOW (ref 6.5–8.1)

## 2019-11-26 LAB — CBC
HCT: 43.2 % (ref 36.0–46.0)
Hemoglobin: 14 g/dL (ref 12.0–15.0)
MCH: 29.9 pg (ref 26.0–34.0)
MCHC: 32.4 g/dL (ref 30.0–36.0)
MCV: 92.1 fL (ref 80.0–100.0)
Platelets: 173 10*3/uL (ref 150–400)
RBC: 4.69 MIL/uL (ref 3.87–5.11)
RDW: 14.1 % (ref 11.5–15.5)
WBC: 10.5 10*3/uL (ref 4.0–10.5)
nRBC: 0 % (ref 0.0–0.2)

## 2019-11-26 LAB — TYPE AND SCREEN
ABO/RH(D): A POS
Antibody Screen: NEGATIVE

## 2019-11-26 LAB — ABO/RH: ABO/RH(D): A POS

## 2019-11-26 LAB — LIPASE, BLOOD: Lipase: 18 U/L (ref 11–51)

## 2019-11-26 LAB — LACTIC ACID, PLASMA: Lactic Acid, Venous: 1.6 mmol/L (ref 0.5–1.9)

## 2019-11-26 LAB — POC OCCULT BLOOD, ED: Fecal Occult Bld: NEGATIVE

## 2019-11-26 MED ORDER — FENTANYL CITRATE (PF) 100 MCG/2ML IJ SOLN
50.0000 ug | Freq: Once | INTRAMUSCULAR | Status: AC
  Administered 2019-11-26: 10:00:00 50 ug via INTRAVENOUS
  Filled 2019-11-26: qty 2

## 2019-11-26 MED ORDER — IOHEXOL 350 MG/ML SOLN
100.0000 mL | Freq: Once | INTRAVENOUS | Status: AC | PRN
  Administered 2019-11-26: 12:00:00 100 mL via INTRAVENOUS

## 2019-11-26 MED ORDER — SODIUM CHLORIDE (PF) 0.9 % IJ SOLN
INTRAMUSCULAR | Status: AC
  Administered 2019-11-26: 16:00:00
  Filled 2019-11-26: qty 50

## 2019-11-26 MED ORDER — ONDANSETRON 4 MG PO TBDP: 4.0000 mg | ORAL_TABLET | Freq: Three times a day (TID) | ORAL | 0 refills | Status: DC | PRN

## 2019-11-26 MED ORDER — FENTANYL CITRATE (PF) 100 MCG/2ML IJ SOLN
50.0000 ug | Freq: Once | INTRAMUSCULAR | Status: AC
  Administered 2019-11-26: 15:00:00 50 ug via INTRAVENOUS
  Filled 2019-11-26: qty 2

## 2019-11-26 NOTE — ED Notes (Signed)
CMP, lipase recollected and submitted to lab, per lab request.

## 2019-11-26 NOTE — ED Provider Notes (Addendum)
[facility] DEPT Provider Note   CSN: [id number] Arrival date & time: 11/26/19  [id number]     History   Chief Complaint Chief Complaint  Patient presents with  . Abdominal Pain  . dark stools    HPI [name] is a 73 y.o. female.  Fistulogram chief complaint abdominal pain, dark stools.  Patient states she has had symptoms for the past 2 days, steadily progressive, more severe today.  States she talked with her primary care doctor yesterday and was told to come to ER for further evaluation.  States pain currently severe, lower, stabbing, right and left side.  No alleviating factors.  Has not taken any pain meds.  No dysuria, no hematuria.  No fever.  States initially was constipated but that has multiple dark, black appearing stools.  No bright red blood per rectum.  Has had nausea no vomiting.     HPI  Past Medical History:  Diagnosis Date  . Allergy    SEASONAL-flonase daily as well as Claritin  . Anemia    takes Ferrous Sulfate daily  . Anxiety    takes Celexa every evening  . Arthritis    BACK/KNEE  . Cataract    IMMATURE AND ON LEFT EYE SHE THINKS  . Chronic back pain   . Chronic back pain    L1 FRACTURE  . Depression    takes Wellbutrin daily  . Diabetes mellitus without complication ([place])    type II  . Dyspnea    with exertion  . Fibromyalgia   . GERD (gastroesophageal reflux disease)    takes Omeprazole daily and Zantac at bedtime  . H/O hiatal hernia   . History of colon polyps   . History of shingles   . Hyperlipidemia    takes Pravastatin daily as well as Questran  . Hypertension    takes Diovan daily  . Insomnia    takes trazodone nightly  . Joint pain   . Muscle spasm    takes Robaxin daily as needed  . Nausea    takes Zofran daily as needed  . Nocturia   . Numbness and tingling    LEGS  . Osteoporosis    takes fosamax  . Sleep apnea    CPAP-SLEEP STUDY DONE > 5 YRS AGO  . Thyroid disease    takes Synthroid  daily    Patient Active Problem List   Diagnosis Date Noted  . S/P total hip arthroplasty 10/18/2016  . Hypersomnia with sleep apnea 07/20/2015  . Hypersomnia, persistent 07/20/2015  . OSA (obstructive sleep apnea) 07/20/2015  . Lumbar compression fracture ([place]) 12/21/2014  . Morbid obesity ([place]) 03/07/2013  . Obstructive sleep apnea 03/07/2013  . Fibromyalgia 03/07/2013  . Other and unspecified hyperlipidemia 03/07/2013  . Hx of gastritis 03/07/2013  . S/P cholecystectomy 03/07/2013  . Chest pain 01/07/2013    Past Surgical History:  Procedure Laterality Date  . ABDOMINAL HYSTERECTOMY    . CARDIAC CATHETERIZATION    . CARPAL TUNNEL RELEASE     right and lt  . CHOLECYSTECTOMY    . COLONOSCOPY    . COLONOSCOPY    . EYE SURGERY Bilateral    Cataract with lens  . FOOT SURGERY Bilateral   . FRACTURE SURGERY     rt hand  . FRACTURE SURGERY     lr wrist  . HAND SURGERY Right 06/1985  . HAND SURGERY Left   . KNEE ARTHROSCOPY Left   . KYPHOPLASTY N/A 12/21/2014  Procedure: KYPHOPLASTY LUMBAR ONE;  Surgeon: Newman Pies, MD;  Location: [facility];  Service: Neurosurgery;  Laterality: N/A;  L1 Kyphoplasty  . ORIF FINGER FRACTURE  04/11/2012   Procedure: OPEN REDUCTION INTERNAL FIXATION (ORIF) METACARPAL (FINGER) FRACTURE;  Surgeon: Ninetta Lights, MD;  Location: [facility];  Service: Orthopedics;  Laterality: Left;  orif left hand 4th metacarpal   . 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 RECTOCELE REPAIR    . SHOULDER ARTHROSCOPY WITH DISTAL CLAVICLE RESECTION Right 05/20/2015   Procedure: SHOULDER ARTHROSCOPY WITH DISTAL CLAVICLE RESECTION DEBRIDEMENT PARTIAL ROTATOR CUFF LABRAL TEAR REMOVAL LOOSE BODIES;  Surgeon: Kathryne Hitch, MD;  Location: [facility];  Service: Orthopedics;  Laterality: Right;  . TONSILLECTOMY    . TOTAL HIP ARTHROPLASTY Right 10/18/2016   Procedure: TOTAL HIP ARTHROPLASTY ANTERIOR APPROACH;  Surgeon:  Ninetta Lights, MD;  Location: [facility];  Service: Orthopedics;  Laterality: Right;  . UPPER GASTROINTESTINAL ENDOSCOPY  02/2013  . WRIST SURGERY Left      OB History   No obstetric history on file.      Home Medications    Prior to Admission medications   Medication Sig Start Date End Date Taking? Authorizing Provider  alendronate (FOSAMAX) 70 MG tablet Take 70 mg by mouth once a week. Take with a full glass of water on an empty stomach.    [provider]  aspirin EC 81 MG tablet Take 81 mg by mouth daily.    [provider]  Biotin 1000 MCG tablet Take 1,000 mcg by mouth daily.     [provider]  buPROPion (WELLBUTRIN SR) 150 MG 12 hr tablet Take 150 mg by mouth daily.    [provider]  Calcium Carbonate-Vit D-Min (CALCIUM 1200) 1200-1000 MG-UNIT CHEW Chew 1 tablet by mouth daily.    [provider]  citalopram (CELEXA) 40 MG tablet Take 40 mg by mouth every evening.    [provider]  Cyanocobalamin (VITAMIN B-12 PO) Take 1 tablet by mouth daily.    [provider]  diphenoxylate-atropine (LOMOTIL) 2.5-0.025 MG per tablet TAKE 1 TABLET BY MOUTH AFTER EACH LOOSE BOWEL MOVEMENT AS NEEDED... UP TO 8 TABLETS A DAY 07/12/15   [provider]  fluticasone (FLONASE) 50 MCG/ACT nasal spray Place 2 sprays into the nose daily.    [provider]  levothyroxine (SYNTHROID, LEVOTHROID) 112 MCG tablet Take 112 mcg by mouth daily before breakfast.    [provider]  loratadine (CLARITIN) 10 MG tablet Take 10 mg by mouth daily.    [provider]  metFORMIN (GLUCOPHAGE-XR) 500 MG 24 hr tablet Take 500 mg by mouth every evening. 09/26/16   [provider]  Omega-3 Fatty Acids (FISH OIL) 1000 MG CAPS Take 1,000 mg by mouth daily.    [provider]  omeprazole (PRILOSEC) 40 MG capsule Take 1 capsule (40 mg total) by mouth daily before breakfast. Patient taking differently: Take 40 mg  by mouth daily as needed (heartburn).  01/07/13   Milus Banister, MD  ondansetron (ZOFRAN) 4 MG tablet Take 1 tablet (4 mg total) by mouth every 6 (six) hours as needed for nausea. Patient taking differently: Take 4 mg by mouth as needed for nausea.  10/19/16   Benedetto Goad, PA-C  oxyCODONE-acetaminophen (PERCOCET/ROXICET) 5-325 MG tablet Take 1 tablet by mouth every 4 (four) hours as needed for moderate pain or severe pain. 10/19/16   Mancel Bale,  Lowell Guitar, PA-C  potassium chloride SA (K-DUR,KLOR-CON) 20 MEQ tablet Take 20 mEq by mouth 2 (two) times daily.    [provider]  pravastatin (PRAVACHOL) 20 MG tablet Take 20 mg by mouth at bedtime.     [provider]  traZODone (DESYREL) 50 MG tablet Take 50 mg by mouth at bedtime.    [provider]  triamcinolone cream (KENALOG) 0.1 % Apply 1 application topically daily as needed (rash).  08/09/16   [provider]  valsartan-hydrochlorothiazide (DIOVAN-HCT) 80-12.5 MG per tablet Take 1 tablet by mouth daily.    [provider]    Family History Family History  Problem Relation Age of Onset  . Hypertension Mother   . Diabetes Mother   . Cancer Maternal Grandfather        back  . Cancer Paternal Uncle   . Lung cancer Paternal Aunt   . Lung cancer Sister   . Heart attack Brother        x 2    Social History Social History   Tobacco Use  . Smoking status: Former Research scientist (life sciences)  . Smokeless tobacco: Never Used  . Tobacco comment: as a young erson, off and on , never had a habit  Substance Use Topics  . Alcohol use: No  . Drug use: No     Allergies   Penicillins and Sulfa antibiotics   Review of Systems Review of Systems  Constitutional: Negative for chills and fever.  HENT: Negative for ear pain and sore throat.   Eyes: Negative for pain and visual disturbance.  Respiratory: Negative for cough and shortness of breath.   Cardiovascular: Negative for chest pain and palpitations.   Gastrointestinal: Positive for abdominal pain and blood in stool. Negative for vomiting.  Genitourinary: Negative for dysuria and hematuria.  Musculoskeletal: Negative for arthralgias and back pain.  Skin: Negative for color change and rash.  Neurological: Negative for seizures and syncope.  All other systems reviewed and are negative.    Physical Exam Updated Vital Signs BP 126/70 (BP Location: Left Arm)   Pulse 84   Temp 98.9 F (37.2 C) (Oral)   Resp 18   Ht 4\' 11"  (1.499 m)   Wt 95.7 kg   SpO2 97%   BMI 42.62 kg/m   Physical Exam Vitals signs and nursing note reviewed.  Constitutional:      General: She is not in acute distress.    Appearance: She is well-developed.  HENT:     Head: Normocephalic and atraumatic.  Eyes:     Conjunctiva/sclera: Conjunctivae normal.  Neck:     Musculoskeletal: Neck supple.  Cardiovascular:     Rate and Rhythm: Normal rate and regular rhythm.     Heart sounds: No murmur.  Pulmonary:     Effort: Pulmonary effort is normal. No respiratory distress.     Breath sounds: Normal breath sounds.  Abdominal:     Palpations: Abdomen is soft.     Comments: Generalized tenderness to palpation in all 4 quadrants, no rebound or guarding  Genitourinary:    Comments: No fissures or hemorrhoids, no stool in rectal vault, noted very small brown stool Skin:    General: Skin is warm and dry.  Neurological:     Mental Status: She is alert.      ED Treatments / Results  Labs (all labs ordered are listed, but only abnormal results are displayed) Labs Reviewed  URINALYSIS, ROUTINE W REFLEX MICROSCOPIC - Abnormal; Notable for the following  components:      Result Value   APPearance HAZY (*)    Leukocytes,Ua TRACE (*)    Bacteria, UA RARE (*)    All other components within normal limits  COMPREHENSIVE METABOLIC PANEL  CBC  LIPASE, BLOOD  LACTIC ACID, PLASMA  LACTIC ACID, PLASMA  POC OCCULT BLOOD, ED  TYPE AND SCREEN    EKG EKG  Interpretation  Date/Time:  Wednesday November 26 2019 09:12:47 EST Ventricular Rate:  76 PR Interval:    QRS Duration: 100 QT Interval:  445 QTC Calculation: 501 R Axis:   -47 Text Interpretation: Sinus rhythm Left anterior fascicular block Abnormal R-wave progression, early transition Probable left ventricular hypertrophy Anterior Q waves, possibly due to LVH Prolonged QT interval Confirmed by Madalyn Rob [phone]) on 11/26/2019 9:19:28 AM   Radiology No results found.  Procedures Procedures (including critical care time)  Medications Ordered in ED Medications  fentaNYL (SUBLIMAZE) injection 50 mcg (has no administration in time range)     Initial Impression / Assessment and Plan / ED Course  I have reviewed the triage vital signs and the nursing notes.  Pertinent labs & imaging results that were available during my care of the patient were reviewed by me and considered in my medical decision making (see chart for details).  Clinical Course as of Nov 25 1549  Wed Nov 26, 2019  1550 Recheck patient, updated on CT results, past p.o. trial, will discharge home   [RD]    Clinical Course User Index [RD] Lucrezia Starch, MD       73 year old lady presented to ER with acute onset of generalized abdominal pain.  On exam patient overall well-appearing but did have generalized tenderness to palpation.  Also reported black stools.  Hemoccult negative.  Labs within normal limits, normal hemoglobin.  Given generalized abdominal pain with black stools, ordered CT angio abdomen pelvis to evaluate for source of any bleeding, acute abdominal process.  CT scan negative for any acute process, did demonstrate mild enteritis.  On reassessment patient is well-appearing, pain with well controlled.  Tolerating p.o. without difficulty.  Believe she is appropriate for discharge and outpatient management this time.  Recommend recheck with primary doctor.  Reviewed precautions with patient and  husband, will discharge.   After the discussed management above, the patient was determined to be safe for discharge.  The patient was in agreement with this plan and all questions regarding their care were answered.  ED return precautions were discussed and the patient will return to the ED with any significant worsening of condition.     Final Clinical Impressions(s) / ED Diagnoses   Final diagnoses:  Generalized abdominal pain    ED Discharge Orders    None       Lucrezia Starch, MD 11/26/19 1529    Lucrezia Starch, MD 11/26/19 [phone]

## 2019-11-26 NOTE — Discharge Instructions (Signed)
Recommend Tylenol for pain control.  For pain and nausea, recommend the prescribed Zofran as needed.  Please schedule an appointment with your primary care doctor for recheck tomorrow or on Friday.  If in the meantime you develop worsening pain, vomiting, fever or other new concerning symptom please return to ER for reassessment.

## 2019-11-26 NOTE — ED Notes (Signed)
Family at bedside. 

## 2019-11-26 NOTE — ED Notes (Signed)
CBC recollected and submitted to lab, per lab request.

## 2019-11-26 NOTE — ED Triage Notes (Signed)
Pt reports since Monday having abd pains, dark stools, and not urinating a lot. Monday night was vomiting.

## 2020-01-05 ENCOUNTER — Other Ambulatory Visit (HOSPITAL_COMMUNITY): Payer: Self-pay | Admitting: Surgery

## 2020-01-05 ENCOUNTER — Other Ambulatory Visit: Payer: Self-pay

## 2020-01-05 ENCOUNTER — Other Ambulatory Visit: Payer: Self-pay | Admitting: Surgery

## 2020-01-05 ENCOUNTER — Ambulatory Visit
Admission: RE | Admit: 2020-01-05 | Discharge: 2020-01-05 | Disposition: A | Discharge status: Home / Self Care | Payer: Medicare Other | Source: Ambulatory Visit | Attending: Surgery | Admitting: Surgery

## 2020-01-05 DIAGNOSIS — M25562 Pain in left knee: Secondary | ICD-10-CM | POA: Insufficient documentation

## 2020-01-05 DIAGNOSIS — M1712 Unilateral primary osteoarthritis, left knee: Secondary | ICD-10-CM | POA: Insufficient documentation

## 2020-01-05 DIAGNOSIS — G8929 Other chronic pain: Secondary | ICD-10-CM | POA: Diagnosis present

## 2020-01-14 ENCOUNTER — Emergency Department (HOSPITAL_COMMUNITY)
Admission: EM | Admit: 2020-01-14 | Discharge: 2020-01-14 | Discharge status: Absent without leave | Payer: Medicare Other

## 2020-01-14 ENCOUNTER — Other Ambulatory Visit: Payer: Self-pay

## 2020-01-14 ENCOUNTER — Ambulatory Visit
Admission: EM | Admit: 2020-01-14 | Discharge: 2020-01-14 | Disposition: A | Discharge status: Home / Self Care | Payer: Medicare Other

## 2020-01-14 DIAGNOSIS — S61012A Laceration without foreign body of left thumb without damage to nail, initial encounter: Secondary | ICD-10-CM

## 2020-01-14 NOTE — Discharge Instructions (Addendum)
Skin glue applied Gently clean with warm water and mild soap.  Avoid submerging wound in water. Take OTC ibuprofen or tylenol as needed for pain releif Return sooner or go to the ED if you have any new or worsening symptoms such as increased pain, redness, swelling, drainage, discharge, decreased range of motion of extremity, etc..

## 2020-01-14 NOTE — ED Triage Notes (Signed)
Pt presents with small laceration to left thumb while cutting vegetables around 5 pm

## 2020-01-14 NOTE — ED Provider Notes (Signed)
[place]   [id number] 01/14/20 Arrival Time: 1815  CC: LACERATION  SUBJECTIVE:  [name] is a 74 y.o. female who presents with a laceration on left thumb that occurred 1.5 hours ago.  Symptoms began while she was cutting vegetables.  Bleeding controlled.  Currently not on blood thinners.  Denies similar symptoms in the past.  Denies fever, chills, nausea, vomiting, redness, swelling, purulent drainage, decrease strength or sensation.   Td UTD: Yes. 2014  ROS: As per HPI.  All other pertinent ROS negative.     Past Medical History:  Diagnosis Date  . Allergy    SEASONAL-flonase daily as well as Claritin  . Anemia    takes Ferrous Sulfate daily  . Anxiety    takes Celexa every evening  . Arthritis    BACK/KNEE  . Cataract    IMMATURE AND ON LEFT EYE SHE THINKS  . Chronic back pain   . Chronic back pain    L1 FRACTURE  . Depression    takes Wellbutrin daily  . Diabetes mellitus without complication ([place])    type II  . Dyspnea    with exertion  . Fibromyalgia   . GERD (gastroesophageal reflux disease)    takes Omeprazole daily and Zantac at bedtime  . H/O hiatal hernia   . History of colon polyps   . History of shingles   . Hyperlipidemia    takes Pravastatin daily as well as Questran  . Hypertension    takes Diovan daily  . Insomnia    takes trazodone nightly  . Joint pain   . Muscle spasm    takes Robaxin daily as needed  . Nausea    takes Zofran daily as needed  . Nocturia   . Numbness and tingling    LEGS  . Osteoporosis    takes fosamax  . Sleep apnea    CPAP-SLEEP STUDY DONE > 5 YRS AGO  . Thyroid disease    takes Synthroid daily   Past Surgical History:  Procedure Laterality Date  . ABDOMINAL HYSTERECTOMY    . CARDIAC CATHETERIZATION    . CARPAL TUNNEL RELEASE     right and lt  . CHOLECYSTECTOMY    . COLONOSCOPY    . COLONOSCOPY    . EYE SURGERY Bilateral    Cataract with lens  . FOOT SURGERY Bilateral   . FRACTURE  SURGERY     rt hand  . FRACTURE SURGERY     lr wrist  . HAND SURGERY Right 06/1985  . HAND SURGERY Left   . KNEE ARTHROSCOPY Left   . KYPHOPLASTY N/A 12/21/2014   Procedure: KYPHOPLASTY LUMBAR ONE;  Surgeon: Newman Pies, MD;  Location: [facility];  Service: Neurosurgery;  Laterality: N/A;  L1 Kyphoplasty  . ORIF FINGER FRACTURE  04/11/2012   Procedure: OPEN REDUCTION INTERNAL FIXATION (ORIF) METACARPAL (FINGER) FRACTURE;  Surgeon: Ninetta Lights, MD;  Location: [facility];  Service: Orthopedics;  Laterality: Left;  orif left hand 4th metacarpal   . 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 RECTOCELE REPAIR    . SHOULDER ARTHROSCOPY WITH DISTAL CLAVICLE RESECTION Right 05/20/2015   Procedure: SHOULDER ARTHROSCOPY WITH DISTAL CLAVICLE RESECTION DEBRIDEMENT PARTIAL ROTATOR CUFF LABRAL TEAR REMOVAL LOOSE BODIES;  Surgeon: Kathryne Hitch, MD;  Location: [facility];  Service: Orthopedics;  Laterality: Right;  . TONSILLECTOMY    . TOTAL HIP ARTHROPLASTY Right 10/18/2016   Procedure: TOTAL HIP ARTHROPLASTY ANTERIOR  APPROACH;  Surgeon: Ninetta Lights, MD;  Location: [facility];  Service: Orthopedics;  Laterality: Right;  . UPPER GASTROINTESTINAL ENDOSCOPY  02/2013  . WRIST SURGERY Left    Allergies  Allergen Reactions  . Penicillins Itching    Has patient had a PCN reaction causing immediate rash, facial/tongue/throat swelling, SOB or lightheadedness with hypotension: No Has patient had a PCN reaction causing severe rash involving mucus membranes or skin necrosis: No Has patient had a PCN reaction that required hospitalization No Has patient had a PCN reaction occurring within the last 10 years: No If all of the above answers are "NO", then may proceed with Cephalosporin use.   . Sulfa Antibiotics Swelling   No current facility-administered medications on file prior to encounter.   Current Outpatient Medications on File Prior to Encounter    Medication Sig Dispense Refill  . alendronate (FOSAMAX) 70 MG tablet Take 70 mg by mouth once a week. Take with a full glass of water on an empty stomach.    Marland Kitchen aspirin EC 81 MG tablet Take 81 mg by mouth daily.    . Biotin 1000 MCG tablet Take 1,000 mcg by mouth daily.     Marland Kitchen buPROPion (WELLBUTRIN SR) 150 MG 12 hr tablet Take 150 mg by mouth daily.    . Calcium Carbonate-Vit D-Min (CALCIUM 1200) 1200-1000 MG-UNIT CHEW Chew 1 tablet by mouth daily.    . citalopram (CELEXA) 40 MG tablet Take 40 mg by mouth every evening.    . Cyanocobalamin (VITAMIN B-12 PO) Take 1 tablet by mouth daily.    . diphenoxylate-atropine (LOMOTIL) 2.5-0.025 MG per tablet TAKE 1 TABLET BY MOUTH AFTER EACH LOOSE BOWEL MOVEMENT AS NEEDED... UP TO 8 TABLETS A DAY  2  . fluticasone (FLONASE) 50 MCG/ACT nasal spray Place 2 sprays into the nose daily.    Marland Kitchen levothyroxine (SYNTHROID, LEVOTHROID) 112 MCG tablet Take 125 mcg by mouth daily before breakfast.     . loratadine (CLARITIN) 10 MG tablet Take 10 mg by mouth daily.    . metFORMIN (GLUCOPHAGE-XR) 500 MG 24 hr tablet Take 500 mg by mouth every evening.  1  . Omega-3 Fatty Acids (FISH OIL) 1000 MG CAPS Take 1,000 mg by mouth daily.    Marland Kitchen omeprazole (PRILOSEC) 40 MG capsule Take 1 capsule (40 mg total) by mouth daily before breakfast. (Patient taking differently: Take 40 mg by mouth daily as needed (heartburn). ) 30 capsule 11  . ondansetron (ZOFRAN ODT) 4 MG disintegrating tablet Take 1 tablet (4 mg total) by mouth every 8 (eight) hours as needed for nausea or vomiting. 20 tablet 0  . ondansetron (ZOFRAN) 4 MG tablet Take 1 tablet (4 mg total) by mouth every 6 (six) hours as needed for nausea. (Patient taking differently: Take 4 mg by mouth as needed for nausea. ) 20 tablet 0  . oxyCODONE-acetaminophen (PERCOCET/ROXICET) 5-325 MG tablet Take 1 tablet by mouth every 4 (four) hours as needed for moderate pain or severe pain. 60 tablet 0  . potassium chloride SA (K-DUR,KLOR-CON)  20 MEQ tablet Take 20 mEq by mouth 2 (two) times daily.    . pravastatin (PRAVACHOL) 20 MG tablet Take 20 mg by mouth at bedtime.     . traZODone (DESYREL) 50 MG tablet Take 50 mg by mouth at bedtime.    . triamcinolone cream (KENALOG) 0.1 % Apply 1 application topically daily as needed (rash).     . valsartan-hydrochlorothiazide (DIOVAN-HCT) 80-12.5 MG per tablet Take 1 tablet by  mouth daily.     Social History   Socioeconomic History  . Marital status: Married    Spouse name: [name]  . Number of children: 5  . Years of education: Not on file  . Highest education level: Not on file  Occupational History  . Occupation: retired  Tobacco Use  . Smoking status: Former Research scientist (life sciences)  . Smokeless tobacco: Never Used  . Tobacco comment: as a young erson, off and on , never had a habit  Substance and Sexual Activity  . Alcohol use: No  . Drug use: No  . Sexual activity: Yes    Birth control/protection: Surgical  Other Topics Concern  . Not on file  Social History Narrative   Drinks 1 cup of coffee daily. 1 diet pepsi.   Social Determinants of Health   Financial Resource Strain:   . Difficulty of Paying Living Expenses: Not on file  Food Insecurity:   . Worried About Charity fundraiser in the Last Year: Not on file  . Ran Out of Food in the Last Year: Not on file  Transportation Needs:   . Lack of Transportation (Medical): Not on file  . Lack of Transportation (Non-Medical): Not on file  Physical Activity:   . Days of Exercise per Week: Not on file  . Minutes of Exercise per Session: Not on file  Stress:   . Feeling of Stress : Not on file  Social Connections:   . Frequency of Communication with Friends and Family: Not on file  . Frequency of Social Gatherings with Friends and Family: Not on file  . Attends Religious Services: Not on file  . Active Member of Clubs or Organizations: Not on file  . Attends Archivist Meetings: Not on file  . Marital Status: Not on file   Intimate Partner Violence:   . Fear of Current or Ex-Partner: Not on file  . Emotionally Abused: Not on file  . Physically Abused: Not on file  . Sexually Abused: Not on file   Family History  Problem Relation Age of Onset  . Hypertension Mother   . Diabetes Mother   . Cancer Maternal Grandfather        back  . Cancer Paternal Uncle   . Lung cancer Paternal Aunt   . Lung cancer Sister   . Heart attack Brother        x 2     OBJECTIVE:  Vitals:   01/14/20 1832  BP: (!) 150/84  Pulse: 73  Resp: 18  Temp: 98 F (36.7 C)  SpO2: 94%     General appearance: alert; no distress Skin: laceration of left thumb; size: approx 1 cm Psychological: alert and cooperative; normal mood and affect   Results for orders placed or performed during the hospital encounter of 11/26/19  Urinalysis, Routine w reflex microscopic  Result Value Ref Range   Color, Urine YELLOW YELLOW   APPearance HAZY (A) CLEAR   Specific Gravity, Urine 1.013 1.005 - 1.030   pH 6.0 5.0 - 8.0   Glucose, UA NEGATIVE NEGATIVE mg/dL   Hgb urine dipstick NEGATIVE NEGATIVE   Bilirubin Urine NEGATIVE NEGATIVE   Ketones, ur NEGATIVE NEGATIVE mg/dL   Protein, ur NEGATIVE NEGATIVE mg/dL   Nitrite NEGATIVE NEGATIVE   Leukocytes,Ua TRACE (A) NEGATIVE   RBC / HPF 0-5 0 - 5 RBC/hpf   WBC, UA 6-10 0 - 5 WBC/hpf   Bacteria, UA RARE (A) NONE SEEN   Squamous Epithelial / LPF  0-5 0 - 5   Mucus PRESENT    Hyaline Casts, UA PRESENT   Lactic acid, plasma  Result Value Ref Range   Lactic Acid, Venous 1.6 0.5 - 1.9 mmol/L  CBC  Result Value Ref Range   WBC 10.5 4.0 - 10.5 K/uL   RBC 4.69 3.87 - 5.11 MIL/uL   Hemoglobin 14.0 12.0 - 15.0 g/dL   HCT 43.2 36.0 - 46.0 %   MCV 92.1 80.0 - 100.0 fL   MCH 29.9 26.0 - 34.0 pg   MCHC 32.4 30.0 - 36.0 g/dL   RDW 14.1 11.5 - 15.5 %   Platelets 173 150 - 400 K/uL   nRBC 0.0 0.0 - 0.2 %  Comprehensive metabolic panel  Result Value Ref Range   Sodium 138 135 - 145 mmol/L    Potassium 3.2 (L) 3.5 - 5.1 mmol/L   Chloride 102 98 - 111 mmol/L   CO2 25 22 - 32 mmol/L   Glucose, Bld 95 70 - 99 mg/dL   BUN 27 (H) 8 - 23 mg/dL   Creatinine, Ser 0.98 0.44 - 1.00 mg/dL   Calcium 8.6 (L) 8.9 - 10.3 mg/dL   Total Protein 6.1 (L) 6.5 - 8.1 g/dL   Albumin 3.3 (L) 3.5 - 5.0 g/dL   AST 27 15 - 41 U/L   ALT 19 0 - 44 U/L   Alkaline Phosphatase 41 38 - 126 U/L   Total Bilirubin 1.3 (H) 0.3 - 1.2 mg/dL   GFR calc non Af Amer 57 (L) >60 mL/min   GFR calc Af Amer >60 >60 mL/min   Anion gap 11 5 - 15  Lipase, blood  Result Value Ref Range   Lipase 18 11 - 51 U/L  POC occult blood, ED  Result Value Ref Range   Fecal Occult Bld NEGATIVE NEGATIVE  Type and screen [place]  Result Value Ref Range   ABO/RH(D) A POS    Antibody Screen NEG    Sample Expiration      11/29/2019,2359 Performed at [facility], [place] [address] [place], [place] [address]   ABO/Rh  Result Value Ref Range   ABO/RH(D)      A POS Performed at [facility], [place] [address] [place], [place] [address]     Labs Reviewed - No data to display  No results found.  Procedure:   ASSESSMENT & PLAN:  1. Laceration of left thumb without foreign body without damage to nail, initial encounter     No orders of the defined types were placed in this encounter.  Suture not required Skin glue applied Gently clean with warm water and mild soap.  Avoid submerging wound in water..  Take OTC ibuprofen or tylenol as needed for pain releif Return sooner or go to the ED if you have any new or worsening symptoms such as increased pain, redness, swelling, drainage, discharge, decreased range of motion of extremity, etc..     Reviewed expectations re: course of current medical issues. Questions answered. Outlined signs and symptoms indicating need for more acute intervention. Patient verbalized understanding. After Visit Summary given.   Emerson Monte, FNP 01/14/20 [phone]

## 2020-02-05 ENCOUNTER — Other Ambulatory Visit: Payer: Self-pay | Admitting: Surgery

## 2020-02-16 ENCOUNTER — Encounter: Payer: Self-pay | Admitting: Surgery

## 2020-02-16 ENCOUNTER — Other Ambulatory Visit: Payer: Self-pay

## 2020-02-16 ENCOUNTER — Encounter
Admission: RE | Admit: 2020-02-16 | Discharge: 2020-02-16 | Disposition: A | Discharge status: Home / Self Care | Payer: Medicare Other | Source: Ambulatory Visit | Attending: Surgery | Admitting: Surgery

## 2020-02-16 DIAGNOSIS — Z01818 Encounter for other preprocedural examination: Secondary | ICD-10-CM | POA: Insufficient documentation

## 2020-02-16 NOTE — Patient Instructions (Signed)
INSTRUCTIONS FOR SURGERY     Your surgery is scheduled for:   Thursday, February 25TH     To find out your arrival time for the day of surgery,          please call [phone] between 1 pm and 3 pm on :  Wednesday, February 24TH     When you arrive for surgery, report to the [facility].       Do NOT stop on the first floor to register.    REMEMBER: Instructions that are not followed completely may result in serious medical risk,  up to and including death, or upon the discretion of your surgeon and anesthesiologist,            your surgery may need to be rescheduled.  __X__ 1. Do not eat food after midnight the night before your procedure.                    No gum, candy, lozenger, tic tacs, tums or hard candies.                  ABSOLUTELY NOTHING SOLID IN YOUR MOUTH AFTER MIDNIGHT                    You may drink unlimited clear liquids up to 2 hours before you are scheduled to arrive for surgery.                   Do not drink anything within those 2 hours unless you need to take medicine, then take the                   smallest amount you need.  Clear liquids include:  water, apple juice without pulp,                   any flavor Gatorade, Black coffee, black tea.  Sugar may be added but no dairy/ honey /lemon.                        Broth and jello is not considered a clear liquid.  __x__  2. On the morning of surgery, please brush your teeth with toothpaste and water. You may rinse with                  mouthwash if you wish but DO NOT SWALLOW TOOTHPASTE OR MOUTHWASH  __X___3. NO alcohol for 24 hours before or after surgery.  __x___ 4.  Do NOT smoke or use e-cigarettes for 24 HOURS PRIOR TO SURGERY.                      DO NOT Use any chewable tobacco products for at least 6 hours prior to surgery.  __x___ 5. If you start any new medication after this appointment and prior to surgery, please                    Bring it with you on the day of surgery.  ___x__ 6. Notify your doctor if there is any change in  your medical condition, such as fever,                   infection, vomitting, diarrhea or any open sores.  __x___ 7.  USE the CHG SOAP as instructed, the night before surgery and the day of surgery.                   Once you have washed with this soap, do NOT use any of the following: Powders, perfumes                    or lotions. Please do not wear make up, hairpins, clips or nail polish. You MAY  wear deodorant.                   Men may shave their face and neck.  Women need to shave 48 hours prior to surgery.                   DO NOT wear ANY jewelry on the day of surgery. If there are rings that are too tight to                    remove easily, please address this prior to the surgery day. Piercings need to be removed.                                                                     NO METAL ON YOUR BODY.                    Do NOT bring any valuables.  If you came to Pre-Admit testing then you will not need license,                     insurance card or credit card.  If you will be staying overnight, please either leave your things in                     the car or have your family be responsible for these items.                     [facility] IS NOT RESPONSIBLE FOR BELONGINGS OR VALUABLES.  ___X__ 8. DO NOT wear contact lenses on surgery day.  You may not have dentures,                     Hearing aides, contacts or glasses in the operating room. These items can be                    Placed in the Recovery Room to receive immediately after surgery.  __x___ 9. IF YOU ARE SCHEDULED TO GO HOME ON THE SAME DAY, YOU MUST                   Have someone to drive you home and to stay with you  for the first 24 hours.                    Have an arrangement prior to arriving on surgery day.  ___x__ 10. Take the following medications  on the morning of surgery with a sip of  water:                              1.  WELLBUTRIN                     2.  SYNTHROID                     3.  CLARITIN                     4.  NORCO (PAIN MEDICATION) if needed                     5.                _x____ 11.  Follow any instructions provided to you by your surgeon.                        Such as enema, clear liquid bowel prep                          @@PLEASE  DRINK ALL OF THE G2 DRINK ON THE MORNING OF                              SURGERY.  IT SHOULD BE COMPLETED BY 2 HOURS PRIOR TO                               ARRIVAL AT THE [id number]  __X__  12. STOP  ASPIRIN AS OF:  TODAY, Monday, February 22ND                       THIS INCLUDES BC POWDERS / GOODIES POWDER  __x___ 13. STOP Anti-inflammatories as of:  Monday, February 22ND                      This includes IBUPROFEN / MOTRIN / ADVIL / ALEVE/ NAPROXYN                    YOU MAY TAKE TYLENOL ANY TIME PRIOR TO SURGERY.  _x____ 14.  Stop supplements until after surgery.                     This includes:  FISH OIL // CALCIUM + D // FERROUS SULFATE (iron)  __x___ 15. Bring your CPAP machine into preop with you on the morning of surgery.  __X____16.  Stop Metformin 2 full days prior to surgery.  Stop on: February 22ND AFTER DINNER                          Do NOT take any diabetes medications on surgery day.  _X_____17.  Continue to take the following medications but do not take on the morning of surgery:                           POTASSIUM // DIOVAN //   _X_____18. If staying overnight, please have appropriate shoes to wear to be able to walk around the unit.  Wear clean and comfortable clothing to the hospital.  Please bring a copy of your Power of Attorney and Living Will so we can make a copy for the chart.  Bring your cell phone and a Games developer.

## 2020-02-17 ENCOUNTER — Other Ambulatory Visit
Admission: RE | Admit: 2020-02-17 | Discharge: 2020-02-17 | Disposition: A | Discharge status: Home / Self Care | Payer: Medicare Other | Source: Ambulatory Visit | Attending: Surgery | Admitting: Surgery

## 2020-02-17 ENCOUNTER — Encounter
Admission: RE | Admit: 2020-02-17 | Discharge: 2020-02-17 | Disposition: A | Discharge status: Home / Self Care | Payer: Medicare Other | Source: Ambulatory Visit | Attending: Surgery | Admitting: Surgery

## 2020-02-17 DIAGNOSIS — Z01812 Encounter for preprocedural laboratory examination: Secondary | ICD-10-CM | POA: Diagnosis not present

## 2020-02-17 DIAGNOSIS — R9431 Abnormal electrocardiogram [ECG] [EKG]: Secondary | ICD-10-CM | POA: Insufficient documentation

## 2020-02-17 DIAGNOSIS — Z01810 Encounter for preprocedural cardiovascular examination: Secondary | ICD-10-CM | POA: Diagnosis present

## 2020-02-17 DIAGNOSIS — I10 Essential (primary) hypertension: Secondary | ICD-10-CM | POA: Insufficient documentation

## 2020-02-17 DIAGNOSIS — U071 COVID-19: Secondary | ICD-10-CM | POA: Insufficient documentation

## 2020-02-17 LAB — CBC WITH DIFFERENTIAL/PLATELET
Abs Immature Granulocytes: 0.02 10*3/uL (ref 0.00–0.07)
Basophils Absolute: 0.1 10*3/uL (ref 0.0–0.1)
Basophils Relative: 1 %
Eosinophils Absolute: 0.2 10*3/uL (ref 0.0–0.5)
Eosinophils Relative: 3 %
HCT: 40.9 % (ref 36.0–46.0)
Hemoglobin: 13.5 g/dL (ref 12.0–15.0)
Immature Granulocytes: 0 %
Lymphocytes Relative: 23 %
Lymphs Abs: 1.5 10*3/uL (ref 0.7–4.0)
MCH: 29.9 pg (ref 26.0–34.0)
MCHC: 33 g/dL (ref 30.0–36.0)
MCV: 90.5 fL (ref 80.0–100.0)
Monocytes Absolute: 0.7 10*3/uL (ref 0.1–1.0)
Monocytes Relative: 11 %
Neutro Abs: 4.3 10*3/uL (ref 1.7–7.7)
Neutrophils Relative %: 62 %
Platelets: 181 10*3/uL (ref 150–400)
RBC: 4.52 MIL/uL (ref 3.87–5.11)
RDW: 13.2 % (ref 11.5–15.5)
WBC: 6.8 10*3/uL (ref 4.0–10.5)
nRBC: 0 % (ref 0.0–0.2)

## 2020-02-17 LAB — COMPREHENSIVE METABOLIC PANEL
ALT: 24 U/L (ref 0–44)
AST: 29 U/L (ref 15–41)
Albumin: 3.8 g/dL (ref 3.5–5.0)
Alkaline Phosphatase: 58 U/L (ref 38–126)
Anion gap: 11 (ref 5–15)
BUN: 14 mg/dL (ref 8–23)
CO2: 23 mmol/L (ref 22–32)
Calcium: 9.1 mg/dL (ref 8.9–10.3)
Chloride: 107 mmol/L (ref 98–111)
Creatinine, Ser: 0.62 mg/dL (ref 0.44–1.00)
GFR calc Af Amer: >60 mL/min (ref >60)
GFR calc non Af Amer: >60 mL/min (ref >60)
Glucose, Bld: 99 mg/dL (ref 70–99)
Potassium: 3.2 mmol/L — ABNORMAL LOW (ref 3.5–5.1)
Sodium: 141 mmol/L (ref 135–145)
Total Bilirubin: 1 mg/dL (ref 0.3–1.2)
Total Protein: 6.6 g/dL (ref 6.5–8.1)

## 2020-02-17 LAB — URINALYSIS, ROUTINE W REFLEX MICROSCOPIC
Bilirubin Urine: NEGATIVE
Glucose, UA: NEGATIVE mg/dL
Hgb urine dipstick: NEGATIVE
Ketones, ur: NEGATIVE mg/dL
Leukocytes,Ua: NEGATIVE
Nitrite: NEGATIVE
Protein, ur: NEGATIVE mg/dL
Specific Gravity, Urine: 1.015 (ref 1.005–1.030)
pH: 5 (ref 5.0–8.0)

## 2020-02-17 LAB — TYPE AND SCREEN
ABO/RH(D): A POS
Antibody Screen: NEGATIVE

## 2020-02-17 LAB — SURGICAL PCR SCREEN
MRSA, PCR: NEGATIVE
Staphylococcus aureus: POSITIVE — AB

## 2020-02-17 LAB — SARS CORONAVIRUS 2 (TAT 6-24 HRS): SARS Coronavirus 2: POSITIVE — AB

## 2020-02-17 NOTE — Pre-Procedure Instructions (Signed)
EKG sent to DR [facility] for review.  "If she does not have new cardiac symptoms and her METS >4 then I think we are ok to proceed"   Per Cleatis Polka RN patient denied any cardiac concerns.

## 2020-02-18 ENCOUNTER — Telehealth: Payer: Self-pay | Admitting: Nurse Practitioner

## 2020-02-18 NOTE — Telephone Encounter (Signed)
Called to discuss with Lynnae January about Covid symptoms and the use of bamlanivimab, a monoclonal antibody infusion for those with mild to moderate Covid symptoms and at a high risk of hospitalization.    Patient reports she does not have any symptoms and would only know she is positive by the test that was required for pre-op.   Pt does not qualify for infusion therapy as she has asymptomatic infection. Isolation precautions discussed. Advised to contact back for consideration should she develop symptoms. Patient verbalized understanding.      Patient Active Problem List   Diagnosis Date Noted  . S/P total hip arthroplasty 10/18/2016  . Hypersomnia with sleep apnea 07/20/2015  . Hypersomnia, persistent 07/20/2015  . OSA (obstructive sleep apnea) 07/20/2015  . Lumbar compression fracture ([place]) 12/21/2014  . Morbid obesity ([place]) 03/07/2013  . Obstructive sleep apnea 03/07/2013  . Fibromyalgia 03/07/2013  . Other and unspecified hyperlipidemia 03/07/2013  . Hx of gastritis 03/07/2013  . S/P cholecystectomy 03/07/2013  . Chest pain 01/07/2013    Alda Lea, AGPCNP-BC Pager: [phone] Amion: Bjorn Pippin

## 2020-03-12 ENCOUNTER — Other Ambulatory Visit: Admission: RE | Admit: 2020-03-12 | Payer: Medicare Other | Source: Ambulatory Visit

## 2020-03-16 ENCOUNTER — Inpatient Hospital Stay: Payer: Medicare Other | Admitting: Anesthesiology

## 2020-03-16 ENCOUNTER — Inpatient Hospital Stay: Payer: Medicare Other

## 2020-03-16 ENCOUNTER — Encounter: Payer: Self-pay | Admitting: Surgery

## 2020-03-16 ENCOUNTER — Other Ambulatory Visit: Payer: Self-pay

## 2020-03-16 ENCOUNTER — Encounter
Admission: RE | Disposition: A | Discharge status: Home Health | Payer: Self-pay | Source: Home / Self Care | Attending: Surgery

## 2020-03-16 ENCOUNTER — Inpatient Hospital Stay
Admission: RE | Admit: 2020-03-16 | Discharge: 2020-03-19 | DRG: 470 | Disposition: A | Discharge status: Home Health | Payer: Medicare Other | Attending: Surgery | Admitting: Surgery

## 2020-03-16 DIAGNOSIS — Z6841 Body Mass Index (BMI) 40.0 and over, adult: Secondary | ICD-10-CM | POA: Diagnosis not present

## 2020-03-16 DIAGNOSIS — I10 Essential (primary) hypertension: Secondary | ICD-10-CM | POA: Diagnosis present

## 2020-03-16 DIAGNOSIS — Z87891 Personal history of nicotine dependence: Secondary | ICD-10-CM | POA: Diagnosis not present

## 2020-03-16 DIAGNOSIS — F419 Anxiety disorder, unspecified: Secondary | ICD-10-CM | POA: Diagnosis present

## 2020-03-16 DIAGNOSIS — Z79890 Hormone replacement therapy (postmenopausal): Secondary | ICD-10-CM | POA: Diagnosis not present

## 2020-03-16 DIAGNOSIS — M810 Age-related osteoporosis without current pathological fracture: Secondary | ICD-10-CM | POA: Diagnosis present

## 2020-03-16 DIAGNOSIS — G8929 Other chronic pain: Secondary | ICD-10-CM | POA: Diagnosis present

## 2020-03-16 DIAGNOSIS — M1712 Unilateral primary osteoarthritis, left knee: Principal | ICD-10-CM | POA: Diagnosis present

## 2020-03-16 DIAGNOSIS — F329 Major depressive disorder, single episode, unspecified: Secondary | ICD-10-CM | POA: Diagnosis present

## 2020-03-16 DIAGNOSIS — E039 Hypothyroidism, unspecified: Secondary | ICD-10-CM | POA: Diagnosis present

## 2020-03-16 DIAGNOSIS — K219 Gastro-esophageal reflux disease without esophagitis: Secondary | ICD-10-CM | POA: Diagnosis present

## 2020-03-16 DIAGNOSIS — G4730 Sleep apnea, unspecified: Secondary | ICD-10-CM | POA: Diagnosis present

## 2020-03-16 DIAGNOSIS — E785 Hyperlipidemia, unspecified: Secondary | ICD-10-CM | POA: Diagnosis present

## 2020-03-16 DIAGNOSIS — Z20822 Contact with and (suspected) exposure to covid-19: Secondary | ICD-10-CM | POA: Diagnosis present

## 2020-03-16 DIAGNOSIS — Z96652 Presence of left artificial knee joint: Secondary | ICD-10-CM

## 2020-03-16 DIAGNOSIS — G4700 Insomnia, unspecified: Secondary | ICD-10-CM | POA: Diagnosis present

## 2020-03-16 DIAGNOSIS — M549 Dorsalgia, unspecified: Secondary | ICD-10-CM | POA: Diagnosis present

## 2020-03-16 DIAGNOSIS — Z23 Encounter for immunization: Secondary | ICD-10-CM | POA: Diagnosis present

## 2020-03-16 DIAGNOSIS — E119 Type 2 diabetes mellitus without complications: Secondary | ICD-10-CM | POA: Diagnosis present

## 2020-03-16 DIAGNOSIS — Z79899 Other long term (current) drug therapy: Secondary | ICD-10-CM | POA: Diagnosis not present

## 2020-03-16 HISTORY — DX: Hypothyroidism, unspecified: E03.9

## 2020-03-16 HISTORY — PX: TOTAL KNEE ARTHROPLASTY: SHX125

## 2020-03-16 LAB — GLUCOSE, CAPILLARY
Glucose-Capillary: 96 mg/dL (ref 70–99)
Glucose-Capillary: 150 mg/dL — ABNORMAL HIGH (ref 70–99)
Glucose-Capillary: 137 mg/dL — ABNORMAL HIGH (ref 70–99)
Glucose-Capillary: 126 mg/dL — ABNORMAL HIGH (ref 70–99)
Glucose-Capillary: 112 mg/dL — ABNORMAL HIGH (ref 70–99)

## 2020-03-16 LAB — POCT I-STAT, CHEM 8
BUN: 18 mg/dL (ref 8–23)
Calcium, Ion: 1.15 mmol/L (ref 1.15–1.40)
Chloride: 105 mmol/L (ref 98–111)
Creatinine, Ser: 0.6 mg/dL (ref 0.44–1.00)
Glucose, Bld: 98 mg/dL (ref 70–99)
HCT: 41 % (ref 36.0–46.0)
Hemoglobin: 13.9 g/dL (ref 12.0–15.0)
Potassium: 3.5 mmol/L (ref 3.5–5.1)
Sodium: 139 mmol/L (ref 135–145)
TCO2: 25 mmol/L (ref 22–32)

## 2020-03-16 LAB — TYPE AND SCREEN
ABO/RH(D): A POS
Antibody Screen: NEGATIVE

## 2020-03-16 SURGERY — ARTHROPLASTY, KNEE, TOTAL
Anesthesia: General | Site: Knee | Laterality: Left

## 2020-03-16 MED ORDER — FAMOTIDINE 20 MG PO TABS
ORAL_TABLET | ORAL | Status: AC
  Filled 2020-03-16: qty 1

## 2020-03-16 MED ORDER — PROPOFOL 10 MG/ML IV BOLUS
INTRAVENOUS | Status: DC | PRN
  Administered 2020-03-16: 15 mg via INTRAVENOUS
  Administered 2020-03-16: 30 mg via INTRAVENOUS
  Administered 2020-03-16: 5 mg via INTRAVENOUS
  Administered 2020-03-16: 15 mg via INTRAVENOUS
  Administered 2020-03-16: 120 mg via INTRAVENOUS
  Administered 2020-03-16: 10 mg via INTRAVENOUS

## 2020-03-16 MED ORDER — ROCURONIUM BROMIDE 100 MG/10ML IV SOLN
INTRAVENOUS | Status: DC | PRN
  Administered 2020-03-16: 40 mg via INTRAVENOUS
  Administered 2020-03-16: 30 mg via INTRAVENOUS

## 2020-03-16 MED ORDER — PRAVASTATIN SODIUM 20 MG PO TABS
20.0000 mg | ORAL_TABLET | Freq: Every day | ORAL | Status: DC
  Administered 2020-03-16 – 2020-03-18 (×3): 20 mg via ORAL
  Filled 2020-03-16 (×3): qty 1

## 2020-03-16 MED ORDER — LORATADINE 10 MG PO TABS
10.0000 mg | ORAL_TABLET | Freq: Every day | ORAL | Status: DC
  Administered 2020-03-17 – 2020-03-19 (×3): 10 mg via ORAL
  Filled 2020-03-16 (×3): qty 1

## 2020-03-16 MED ORDER — KETOROLAC TROMETHAMINE 15 MG/ML IJ SOLN
15.0000 mg | Freq: Once | INTRAMUSCULAR | Status: AC
  Administered 2020-03-16: 15 mg via INTRAVENOUS

## 2020-03-16 MED ORDER — METOCLOPRAMIDE HCL 10 MG PO TABS: 5.0000 mg | ORAL_TABLET | Freq: Three times a day (TID) | ORAL | Status: DC | PRN

## 2020-03-16 MED ORDER — FLUTICASONE PROPIONATE 50 MCG/ACT NA SUSP
2.0000 | Freq: Every day | NASAL | Status: DC
  Administered 2020-03-17 – 2020-03-19 (×3): 2 via NASAL
  Filled 2020-03-16: qty 16

## 2020-03-16 MED ORDER — VALSARTAN-HYDROCHLOROTHIAZIDE 80-12.5 MG PO TABS: 1.0000 | ORAL_TABLET | Freq: Every day | ORAL | Status: DC

## 2020-03-16 MED ORDER — FERROUS SULFATE 325 (65 FE) MG PO TABS
325.0000 mg | ORAL_TABLET | Freq: Every day | ORAL | Status: DC
  Administered 2020-03-17 – 2020-03-19 (×3): 325 mg via ORAL
  Filled 2020-03-16 (×3): qty 1

## 2020-03-16 MED ORDER — SUGAMMADEX SODIUM 200 MG/2ML IV SOLN
INTRAVENOUS | Status: DC | PRN
  Administered 2020-03-16: 201.4 mg via INTRAVENOUS

## 2020-03-16 MED ORDER — TRANEXAMIC ACID 1000 MG/10ML IV SOLN
INTRAVENOUS | Status: DC | PRN
  Administered 2020-03-16: 1000 mg via TOPICAL

## 2020-03-16 MED ORDER — METOPROLOL TARTRATE 5 MG/5ML IV SOLN
INTRAVENOUS | Status: AC
  Filled 2020-03-16: qty 5

## 2020-03-16 MED ORDER — FLEET ENEMA 7-19 GM/118ML RE ENEM: 1.0000 | ENEMA | Freq: Once | RECTAL | Status: DC | PRN

## 2020-03-16 MED ORDER — POTASSIUM CHLORIDE CRYS ER 20 MEQ PO TBCR
20.0000 meq | EXTENDED_RELEASE_TABLET | Freq: Two times a day (BID) | ORAL | Status: DC
  Administered 2020-03-16 – 2020-03-19 (×6): 20 meq via ORAL
  Filled 2020-03-16 (×6): qty 1

## 2020-03-16 MED ORDER — DEXAMETHASONE SODIUM PHOSPHATE 10 MG/ML IJ SOLN
INTRAMUSCULAR | Status: DC | PRN
  Administered 2020-03-16: 5 mg via INTRAVENOUS

## 2020-03-16 MED ORDER — CLINDAMYCIN PHOSPHATE 600 MG/50ML IV SOLN
600.0000 mg | Freq: Four times a day (QID) | INTRAVENOUS | Status: AC
  Administered 2020-03-16 – 2020-03-17 (×3): 600 mg via INTRAVENOUS
  Filled 2020-03-16 (×3): qty 50

## 2020-03-16 MED ORDER — FENTANYL CITRATE (PF) 100 MCG/2ML IJ SOLN
INTRAMUSCULAR | Status: AC
  Filled 2020-03-16: qty 2

## 2020-03-16 MED ORDER — IRBESARTAN 150 MG PO TABS
75.0000 mg | ORAL_TABLET | Freq: Every day | ORAL | Status: DC
  Administered 2020-03-16 – 2020-03-19 (×4): 75 mg via ORAL
  Filled 2020-03-16 (×4): qty 1

## 2020-03-16 MED ORDER — ACETAMINOPHEN 10 MG/ML IV SOLN
INTRAVENOUS | Status: DC | PRN
  Administered 2020-03-16: 1000 mg via INTRAVENOUS

## 2020-03-16 MED ORDER — CLINDAMYCIN PHOSPHATE 900 MG/50ML IV SOLN
900.0000 mg | INTRAVENOUS | Status: AC
  Administered 2020-03-16: 900 mg via INTRAVENOUS

## 2020-03-16 MED ORDER — LIDOCAINE HCL (PF) 2 % IJ SOLN
INTRAMUSCULAR | Status: AC
  Filled 2020-03-16: qty 10

## 2020-03-16 MED ORDER — ACETAMINOPHEN 325 MG PO TABS
325.0000 mg | ORAL_TABLET | Freq: Four times a day (QID) | ORAL | Status: DC | PRN
  Administered 2020-03-18 – 2020-03-19 (×2): 650 mg via ORAL
  Filled 2020-03-16 (×2): qty 2

## 2020-03-16 MED ORDER — ACETAMINOPHEN 500 MG PO TABS
1000.0000 mg | ORAL_TABLET | Freq: Four times a day (QID) | ORAL | Status: AC
  Administered 2020-03-16 – 2020-03-17 (×4): 1000 mg via ORAL
  Filled 2020-03-16 (×4): qty 2

## 2020-03-16 MED ORDER — CITALOPRAM HYDROBROMIDE 20 MG PO TABS
40.0000 mg | ORAL_TABLET | Freq: Every day | ORAL | Status: DC
  Administered 2020-03-16 – 2020-03-19 (×4): 40 mg via ORAL
  Filled 2020-03-16 (×4): qty 2

## 2020-03-16 MED ORDER — ONDANSETRON HCL 4 MG/2ML IJ SOLN: 4.0000 mg | Freq: Four times a day (QID) | INTRAMUSCULAR | Status: DC | PRN

## 2020-03-16 MED ORDER — FENTANYL CITRATE (PF) 100 MCG/2ML IJ SOLN
INTRAMUSCULAR | Status: DC | PRN
  Administered 2020-03-16: 25 ug via INTRAVENOUS
  Administered 2020-03-16 (×3): 50 ug via INTRAVENOUS
  Administered 2020-03-16: 25 ug via INTRAVENOUS

## 2020-03-16 MED ORDER — OXYCODONE HCL 5 MG PO TABS
5.0000 mg | ORAL_TABLET | ORAL | Status: DC | PRN
  Administered 2020-03-16 – 2020-03-19 (×16): 10 mg via ORAL
  Filled 2020-03-16 (×16): qty 2

## 2020-03-16 MED ORDER — LEVOTHYROXINE SODIUM 125 MCG PO TABS
125.0000 ug | ORAL_TABLET | Freq: Every day | ORAL | Status: DC
  Administered 2020-03-17 – 2020-03-19 (×3): 125 ug via ORAL
  Filled 2020-03-16 (×4): qty 1

## 2020-03-16 MED ORDER — HYDROMORPHONE HCL 1 MG/ML IJ SOLN
0.2500 mg | INTRAMUSCULAR | Status: DC | PRN
  Administered 2020-03-17: 0.25 mg via INTRAVENOUS
  Administered 2020-03-17 – 2020-03-18 (×4): 0.5 mg via INTRAVENOUS
  Filled 2020-03-16 (×5): qty 1

## 2020-03-16 MED ORDER — MAGNESIUM HYDROXIDE 400 MG/5ML PO SUSP
30.0000 mL | Freq: Every day | ORAL | Status: DC | PRN
  Administered 2020-03-17: 30 mL via ORAL
  Filled 2020-03-16: qty 30

## 2020-03-16 MED ORDER — PROPOFOL 500 MG/50ML IV EMUL
INTRAVENOUS | Status: AC
  Filled 2020-03-16: qty 50

## 2020-03-16 MED ORDER — SODIUM CHLORIDE FLUSH 0.9 % IV SOLN
INTRAVENOUS | Status: AC
  Filled 2020-03-16: qty 40

## 2020-03-16 MED ORDER — LIDOCAINE HCL (CARDIAC) PF 100 MG/5ML IV SOSY
PREFILLED_SYRINGE | INTRAVENOUS | Status: DC | PRN
  Administered 2020-03-16: 100 mg via INTRAVENOUS

## 2020-03-16 MED ORDER — PNEUMOCOCCAL VAC POLYVALENT 25 MCG/0.5ML IJ INJ
0.5000 mL | INJECTION | INTRAMUSCULAR | Status: AC
  Administered 2020-03-17: 0.5 mL via INTRAMUSCULAR
  Filled 2020-03-16: qty 0.5

## 2020-03-16 MED ORDER — CALCIUM CARBONATE-VITAMIN D 500-200 MG-UNIT PO TABS
1.0000 | ORAL_TABLET | Freq: Every day | ORAL | Status: DC
  Administered 2020-03-17 – 2020-03-19 (×3): 1 via ORAL
  Filled 2020-03-16 (×3): qty 1

## 2020-03-16 MED ORDER — SODIUM CHLORIDE 0.9 % IV SOLN: INTRAVENOUS | Status: DC

## 2020-03-16 MED ORDER — KETOROLAC TROMETHAMINE 15 MG/ML IJ SOLN
INTRAMUSCULAR | Status: AC
  Administered 2020-03-16: 7.5 mg via INTRAVENOUS
  Filled 2020-03-16: qty 1

## 2020-03-16 MED ORDER — CLINDAMYCIN PHOSPHATE 900 MG/50ML IV SOLN
INTRAVENOUS | Status: AC
  Filled 2020-03-16: qty 50

## 2020-03-16 MED ORDER — BUPIVACAINE-EPINEPHRINE (PF) 0.5% -1:200000 IJ SOLN
INTRAMUSCULAR | Status: DC | PRN
  Administered 2020-03-16: 30 mL via PERINEURAL

## 2020-03-16 MED ORDER — METOCLOPRAMIDE HCL 5 MG/ML IJ SOLN: 5.0000 mg | Freq: Three times a day (TID) | INTRAMUSCULAR | Status: DC | PRN

## 2020-03-16 MED ORDER — HYDROCHLOROTHIAZIDE 12.5 MG PO CAPS
12.5000 mg | ORAL_CAPSULE | Freq: Every day | ORAL | Status: DC
  Administered 2020-03-16 – 2020-03-19 (×4): 12.5 mg via ORAL
  Filled 2020-03-16 (×4): qty 1

## 2020-03-16 MED ORDER — EPINEPHRINE PF 1 MG/ML IJ SOLN
INTRAMUSCULAR | Status: AC
  Filled 2020-03-16: qty 1

## 2020-03-16 MED ORDER — DOCUSATE SODIUM 100 MG PO CAPS
100.0000 mg | ORAL_CAPSULE | Freq: Two times a day (BID) | ORAL | Status: DC
  Administered 2020-03-16 – 2020-03-19 (×5): 100 mg via ORAL
  Filled 2020-03-16 (×5): qty 1

## 2020-03-16 MED ORDER — BUPIVACAINE LIPOSOME 1.3 % IJ SUSP
INTRAMUSCULAR | Status: AC
  Filled 2020-03-16: qty 20

## 2020-03-16 MED ORDER — METOPROLOL TARTRATE 5 MG/5ML IV SOLN
INTRAVENOUS | Status: DC | PRN
  Administered 2020-03-16: 2 mg via INTRAVENOUS
  Administered 2020-03-16: 1 mg via INTRAVENOUS
  Administered 2020-03-16: 2 mg via INTRAVENOUS

## 2020-03-16 MED ORDER — ONDANSETRON HCL 4 MG/2ML IJ SOLN
INTRAMUSCULAR | Status: AC
  Filled 2020-03-16: qty 2

## 2020-03-16 MED ORDER — BUPIVACAINE HCL (PF) 0.5 % IJ SOLN
INTRAMUSCULAR | Status: AC
  Filled 2020-03-16: qty 10

## 2020-03-16 MED ORDER — HYDRALAZINE HCL 20 MG/ML IJ SOLN
INTRAMUSCULAR | Status: DC | PRN
  Administered 2020-03-16 (×2): 5 mg via INTRAVENOUS

## 2020-03-16 MED ORDER — PANTOPRAZOLE SODIUM 40 MG PO TBEC
40.0000 mg | DELAYED_RELEASE_TABLET | Freq: Every day | ORAL | Status: DC
  Administered 2020-03-17 – 2020-03-19 (×3): 40 mg via ORAL
  Filled 2020-03-16 (×3): qty 1

## 2020-03-16 MED ORDER — INSULIN ASPART 100 UNIT/ML ~~LOC~~ SOLN: 0.0000 [IU] | Freq: Three times a day (TID) | SUBCUTANEOUS | Status: DC

## 2020-03-16 MED ORDER — BUPROPION HCL ER (SR) 150 MG PO TB12
150.0000 mg | ORAL_TABLET | Freq: Every day | ORAL | Status: DC
  Administered 2020-03-17 – 2020-03-19 (×3): 150 mg via ORAL
  Filled 2020-03-16 (×3): qty 1

## 2020-03-16 MED ORDER — INFLUENZA VAC A&B SA ADJ QUAD 0.5 ML IM PRSY
0.5000 mL | PREFILLED_SYRINGE | INTRAMUSCULAR | Status: AC
  Administered 2020-03-19: 0.5 mL via INTRAMUSCULAR
  Filled 2020-03-16: qty 0.5

## 2020-03-16 MED ORDER — CHLORHEXIDINE GLUCONATE 4 % EX LIQD
60.0000 mL | Freq: Once | CUTANEOUS | Status: AC
  Administered 2020-03-16: 4 via TOPICAL

## 2020-03-16 MED ORDER — HYDRALAZINE HCL 20 MG/ML IJ SOLN
INTRAMUSCULAR | Status: AC
  Filled 2020-03-16: qty 1

## 2020-03-16 MED ORDER — ONDANSETRON HCL 4 MG PO TABS
4.0000 mg | ORAL_TABLET | Freq: Four times a day (QID) | ORAL | Status: DC | PRN
  Filled 2020-03-16: qty 1

## 2020-03-16 MED ORDER — DEXAMETHASONE SODIUM PHOSPHATE 10 MG/ML IJ SOLN
INTRAMUSCULAR | Status: AC
  Filled 2020-03-16: qty 1

## 2020-03-16 MED ORDER — BUPIVACAINE HCL (PF) 0.5 % IJ SOLN
INTRAMUSCULAR | Status: AC
  Filled 2020-03-16: qty 30

## 2020-03-16 MED ORDER — TRANEXAMIC ACID 1000 MG/10ML IV SOLN
INTRAVENOUS | Status: AC
  Filled 2020-03-16: qty 10

## 2020-03-16 MED ORDER — TRAZODONE HCL 50 MG PO TABS
50.0000 mg | ORAL_TABLET | Freq: Every day | ORAL | Status: DC
  Administered 2020-03-16 – 2020-03-18 (×3): 50 mg via ORAL
  Filled 2020-03-16 (×3): qty 1

## 2020-03-16 MED ORDER — BISACODYL 10 MG RE SUPP: 10.0000 mg | Freq: Every day | RECTAL | Status: DC | PRN

## 2020-03-16 MED ORDER — KETAMINE HCL 10 MG/ML IJ SOLN
INTRAMUSCULAR | Status: DC | PRN
  Administered 2020-03-16: 25 mg via INTRAVENOUS
  Administered 2020-03-16: 5 mg via INTRAVENOUS
  Administered 2020-03-16 (×2): 10 mg via INTRAVENOUS

## 2020-03-16 MED ORDER — FAMOTIDINE 20 MG PO TABS
20.0000 mg | ORAL_TABLET | Freq: Once | ORAL | Status: AC
  Administered 2020-03-16: 20 mg via ORAL

## 2020-03-16 MED ORDER — ASPIRIN EC 81 MG PO TBEC
81.0000 mg | DELAYED_RELEASE_TABLET | Freq: Every day | ORAL | Status: DC
  Administered 2020-03-17 – 2020-03-19 (×3): 81 mg via ORAL
  Filled 2020-03-16 (×4): qty 1

## 2020-03-16 MED ORDER — FISH OIL 500 MG PO CAPS: 500.0000 mg | ORAL_CAPSULE | Freq: Every day | ORAL | Status: DC

## 2020-03-16 MED ORDER — DIPHENHYDRAMINE HCL 12.5 MG/5ML PO ELIX: 12.5000 mg | ORAL_SOLUTION | ORAL | Status: DC | PRN

## 2020-03-16 MED ORDER — SODIUM CHLORIDE 0.9 % IV SOLN
INTRAVENOUS | Status: DC | PRN
  Administered 2020-03-16: 60 mL

## 2020-03-16 MED ORDER — ONDANSETRON HCL 4 MG/2ML IJ SOLN
INTRAMUSCULAR | Status: DC | PRN
  Administered 2020-03-16: 4 mg via INTRAVENOUS

## 2020-03-16 MED ORDER — KETAMINE HCL 50 MG/ML IJ SOLN
INTRAMUSCULAR | Status: AC
  Filled 2020-03-16: qty 10

## 2020-03-16 MED ORDER — ONDANSETRON HCL 4 MG/2ML IJ SOLN: 4.0000 mg | Freq: Once | INTRAMUSCULAR | Status: DC | PRN

## 2020-03-16 MED ORDER — KETOROLAC TROMETHAMINE 15 MG/ML IJ SOLN
7.5000 mg | Freq: Four times a day (QID) | INTRAMUSCULAR | Status: AC
  Administered 2020-03-16 – 2020-03-17 (×3): 7.5 mg via INTRAVENOUS
  Filled 2020-03-16 (×4): qty 1

## 2020-03-16 MED ORDER — SODIUM CHLORIDE (PF) 0.9 % IJ SOLN
INTRAMUSCULAR | Status: AC
  Filled 2020-03-16: qty 50

## 2020-03-16 MED ORDER — TRAMADOL HCL 50 MG PO TABS
50.0000 mg | ORAL_TABLET | Freq: Four times a day (QID) | ORAL | Status: DC | PRN
  Administered 2020-03-16: 50 mg via ORAL
  Filled 2020-03-16: qty 1

## 2020-03-16 MED ORDER — FENTANYL CITRATE (PF) 100 MCG/2ML IJ SOLN
INTRAMUSCULAR | Status: AC
  Administered 2020-03-16: 25 ug via INTRAVENOUS
  Filled 2020-03-16: qty 2

## 2020-03-16 MED ORDER — PHENYLEPHRINE HCL (PRESSORS) 10 MG/ML IV SOLN
INTRAVENOUS | Status: AC
  Filled 2020-03-16: qty 1

## 2020-03-16 MED ORDER — SODIUM CHLORIDE (PF) 0.9 % IJ SOLN
INTRAMUSCULAR | Status: AC
  Filled 2020-03-16: qty 10

## 2020-03-16 MED ORDER — ENOXAPARIN SODIUM 40 MG/0.4ML ~~LOC~~ SOLN
40.0000 mg | SUBCUTANEOUS | Status: DC
  Administered 2020-03-17 – 2020-03-19 (×3): 40 mg via SUBCUTANEOUS
  Filled 2020-03-16 (×3): qty 0.4

## 2020-03-16 MED ORDER — ACETAMINOPHEN 10 MG/ML IV SOLN
INTRAVENOUS | Status: AC
  Filled 2020-03-16: qty 100

## 2020-03-16 MED ORDER — FENTANYL CITRATE (PF) 100 MCG/2ML IJ SOLN
25.0000 ug | INTRAMUSCULAR | Status: AC | PRN
  Administered 2020-03-16 (×7): 25 ug via INTRAVENOUS

## 2020-03-16 MED ORDER — METFORMIN HCL ER 500 MG PO TB24
500.0000 mg | ORAL_TABLET | Freq: Every day | ORAL | Status: DC
  Administered 2020-03-16 – 2020-03-18 (×3): 500 mg via ORAL
  Filled 2020-03-16 (×4): qty 1

## 2020-03-16 SURGICAL SUPPLY — 66 items
APL PRP STRL LF DISP 70% ISPRP (MISCELLANEOUS) ×1
BLADE SAW SAG 25X90X1.19 (BLADE) ×3 IMPLANT
BLADE SURG SZ20 CARB STEEL (BLADE) ×3 IMPLANT
BNDG CMPR STD VLCR NS LF 5.8X6 (GAUZE/BANDAGES/DRESSINGS) ×1
BNDG ELASTIC 6X5.8 VLCR NS LF (GAUZE/BANDAGES/DRESSINGS) ×3 IMPLANT
BNDG ELASTIC 6X5.8 VLCR STR LF (GAUZE/BANDAGES/DRESSINGS) ×2 IMPLANT
BRNG TIB 71X10 ANT STAB MDLR (Insert) ×1 IMPLANT
CANISTER SUCT 1200ML W/VALVE (MISCELLANEOUS) ×3 IMPLANT
CANISTER SUCT 3000ML PPV (MISCELLANEOUS) ×3 IMPLANT
CEMENT BONE R 1X40 (Cement) ×6 IMPLANT
CEMENT VACUUM MIXING SYSTEM (MISCELLANEOUS) ×3 IMPLANT
CHLORAPREP W/TINT 26 (MISCELLANEOUS) ×3 IMPLANT
COOLER POLAR GLACIER W/PUMP (MISCELLANEOUS) ×3 IMPLANT
COVER MAYO STAND REUSABLE (DRAPES) ×3 IMPLANT
COVER WAND RF STERILE (DRAPES) ×3 IMPLANT
CUFF TOURN SGL QUICK 24 (TOURNIQUET CUFF)
CUFF TOURN SGL QUICK 30 (TOURNIQUET CUFF) ×3
CUFF TRNQT CYL 24X4X16.5-23 (TOURNIQUET CUFF) IMPLANT
CUFF TRNQT CYL 30X4X21-28X (TOURNIQUET CUFF) IMPLANT
DRAPE 3/4 80X56 (DRAPES) ×3 IMPLANT
DRSG MEPILEX SACRM 8.7X9.8 (GAUZE/BANDAGES/DRESSINGS) ×2 IMPLANT
DRSG OPSITE POSTOP 4X10 (GAUZE/BANDAGES/DRESSINGS) ×3 IMPLANT
DRSG OPSITE POSTOP 4X8 (GAUZE/BANDAGES/DRESSINGS) ×1 IMPLANT
ELECT CAUTERY BLADE 6.4 (BLADE) ×3 IMPLANT
ELECT REM PT RETURN 9FT ADLT (ELECTROSURGICAL) ×3
ELECTRODE REM PT RTRN 9FT ADLT (ELECTROSURGICAL) ×1 IMPLANT
FEMORAL CR LEFT 65MM (Joint) ×2 IMPLANT
GLOVE BIO SURGEON STRL SZ7.5 (GLOVE) ×12 IMPLANT
GLOVE BIO SURGEON STRL SZ8 (GLOVE) ×12 IMPLANT
GLOVE BIOGEL PI IND STRL 8 (GLOVE) ×1 IMPLANT
GLOVE BIOGEL PI INDICATOR 8 (GLOVE) ×2
GLOVE INDICATOR 8.0 STRL GRN (GLOVE) ×3 IMPLANT
GOWN STRL REUS W/ TWL LRG LVL3 (GOWN DISPOSABLE) ×1 IMPLANT
GOWN STRL REUS W/ TWL XL LVL3 (GOWN DISPOSABLE) ×1 IMPLANT
GOWN STRL REUS W/TWL LRG LVL3 (GOWN DISPOSABLE) ×6
GOWN STRL REUS W/TWL XL LVL3 (GOWN DISPOSABLE) ×3
HOLDER FOLEY CATH W/STRAP (MISCELLANEOUS) ×1 IMPLANT
HOOD PEEL AWAY FLYTE STAYCOOL (MISCELLANEOUS) ×9 IMPLANT
INSERT TIB BEARING 71 10 (Insert) ×2 IMPLANT
KIT TURNOVER KIT A (KITS) ×3 IMPLANT
NDL SAFETY ECLIPSE 18X1.5 (NEEDLE) ×2 IMPLANT
NDL SPNL 20GX3.5 QUINCKE YW (NEEDLE) ×1 IMPLANT
NEEDLE HYPO 18GX1.5 SHARP (NEEDLE) ×6
NEEDLE SPNL 20GX3.5 QUINCKE YW (NEEDLE) ×3 IMPLANT
NS IRRIG 1000ML POUR BTL (IV SOLUTION) ×3 IMPLANT
PACK TOTAL KNEE (MISCELLANEOUS) ×3 IMPLANT
PAD WRAPON POLAR KNEE (MISCELLANEOUS) ×1 IMPLANT
PLATE KNEE TIBIAL 71MM FIXED (Plate) ×2 IMPLANT
PREVENA INCISION MGT 90 150 (MISCELLANEOUS) ×2 IMPLANT
PULSAVAC PLUS IRRIG FAN TIP (DISPOSABLE) ×3
SET GUIDE SURG MEDIAL KNEE (INSTRUMENTS) ×6 IMPLANT
SOL .9 NS 3000ML IRR  AL (IV SOLUTION) ×3
SOL .9 NS 3000ML IRR AL (IV SOLUTION) ×1
SOL .9 NS 3000ML IRR UROMATIC (IV SOLUTION) ×1 IMPLANT
STAPLER SKIN PROX 35W (STAPLE) ×3 IMPLANT
SUCTION FRAZIER HANDLE 10FR (MISCELLANEOUS) ×3
SUCTION TUBE FRAZIER 10FR DISP (MISCELLANEOUS) ×1 IMPLANT
SUT VIC AB 0 CT1 36 (SUTURE) ×9 IMPLANT
SUT VIC AB 2-0 CT1 27 (SUTURE) ×9
SUT VIC AB 2-0 CT1 TAPERPNT 27 (SUTURE) ×3 IMPLANT
SYR 10ML LL (SYRINGE) ×3 IMPLANT
SYR 20ML LL LF (SYRINGE) ×3 IMPLANT
SYR 30ML LL (SYRINGE) ×9 IMPLANT
TIP FAN IRRIG PULSAVAC PLUS (DISPOSABLE) ×1 IMPLANT
TRAY FOLEY MTR SLVR 16FR STAT (SET/KITS/TRAYS/PACK) ×1 IMPLANT
WRAPON POLAR PAD KNEE (MISCELLANEOUS) ×3

## 2020-03-16 NOTE — OR Nursing (Signed)
Dr Kayleen Memos reviewed previous EKG no new orders.

## 2020-03-16 NOTE — Op Note (Signed)
03/16/2020  10:33 AM  Patient:   [name]  Pre-Op Diagnosis:   Degenerative joint disease, left knee.  Post-Op Diagnosis:   Same  Procedure:   Left TKA using Signature-guided all-cemented Bi[employer] Vanguard system with a 65 mm PCR femur, a 71 mm tibial tray with a 10 mm anterior stabilized E-poly insert, and a 34 x 8.5 mm all-poly 3-pegged domed patella.  Surgeon:   Pascal Lux, MD  Assistant:   Cameron Proud, PA-C   Anesthesia:   Attempted spinal --> GET  Findings:   As above  Complications:   None  EBL:   10 cc  Fluids:   800 cc crystalloid  UOP:   None  TT:   90 minutes at 300 mmHg  Drains:   Praveena x1  Closure:   Staples  Implants:   As above  Brief Clinical Note:   The patient is a 74 year old female with a long history of progressively worsening left knee pain. The patient's symptoms have progressed despite medications, activity modification, injections, etc. The patient's history and examination were consistent with advanced degenerative joint disease of the left knee confirmed by plain radiographs. The patient presents at this time for a left total knee arthroplasty.  Procedure:   The patient was brought into the operating room. After an attempt at performing spinal anesthesia, it was elected to proceed with general endotracheal intubation and anesthesia. The left lower extremity was prepped with ChloraPrep solution and draped sterilely. Preoperative antibiotics were administered. After verifying the proper laterality with a surgical timeout, the limb was exsanguinated with an Esmarch and the tourniquet inflated to 300 mmHg. A standard anterior approach to the knee was made through an approximately 9-10 inch incision. The incision was carried down through the subcutaneous tissues to expose superficial retinaculum. This was split the length of the incision and the medial flap elevated sufficiently to expose the medial retinaculum. The medial retinaculum was  incised, leaving a 3-4 mm cuff of tissue on the patella. This was extended distally along the medial border of the patellar tendon and proximally through the medial third of the quadriceps tendon. A subtotal fat pad excision was performed before the soft tissues were elevated off the anteromedial and anterolateral aspects of the proximal tibia to the level of the collateral ligaments. The anterior portions of the medial and lateral menisci were removed, as was the anterior cruciate ligament.   With the knee flexed to 90, the signature femoral guide was positioned.  The anterior and distal peg holes were placed before the distal pegs were removed.  The cutting block was placed over the anterior pegs and the appropriate distal cut made. The 4-in-1 cutting block was positioned and first the posterior, then the posterior chamfer, the anterior chamfer, and finally the anterior cuts were made. At this point, the posterior portions medial and lateral menisci were removed.   Attention was directed to the proximal tibia.  Again, the tibial signature guide was positioned and the anterior pins placed.  Using these pins, the proximal tibial cutting block was positioned and the appropriate cut made.  A trial reduction was performed using the appropriate femoral and tibial components with the 10 mm insert. This demonstrated excellent stability to varus and valgus stressing both in flexion and extension while permitting full extension. Patella tracking was assessed and found to be excellent. Therefore, the tibial guide position was marked on the proximal tibia. The patella thickness was measured and found to be 16 mm. Therefore,  it was felt best to not resurface the patella for fear of overstuffing the patellofemoral space. Furthermore, there were grade 2 chondromalacial changes on the patella but no exposed or eburnated bone. Patella tracking was assessed and found to be excellent, passing the "no thumb test". The lug  holes were drilled into the distal femur before the trial femoral component was removed, leaving only the tibial tray. The keel was then created using the appropriate tower, reamer, and punch.  The bony surfaces were prepared for cementing by irrigating them thoroughly with bacitracin saline solution via the jet lavage system. A bone plug was fashioned from some of the bone that had been removed previously and used to plug the distal femoral canal. In addition, 20 cc of Exparel diluted out to 60 cc with normal saline and 30 cc of 0.5% Sensorcaine were injected into the postero-medial and postero-lateral aspects of the knee, the medial and lateral gutter regions, and the peri-incisional tissues to help with postoperative analgesia. Meanwhile, the cement was being mixed on the back table. When it was ready, the tibial tray was cemented in first. The excess cement was removed using [employer] fast streamer. Next, the femoral component was impacted into place. Again, the excess cement was removed using [employer] fast streamer. The 10 mm trial insert was positioned and the knee brought into extension while the cement hardened. Finally, the patella was cemented into place and secured using the patellar clamp. Again, the excess cement was removed using [employer] fast streamer. Once the cement had hardened, the knee was placed through a range of motion with the findings as described above. Therefore, the trial insert was removed and, after verifying that no cement had been retained posteriorly, the permanent anterior stabilized 10 mm anterior stabilized E-polyethylene insert was positioned and secured using the appropriate key locking mechanism. Again the knee was placed through a range of motion with the findings as described above.  The wound was copiously irrigated with sterile saline solution using the jet lavage system before the quadriceps tendon and retinacular layer were reapproximated using #0 Vicryl interrupted sutures. The  superficial retinacular layer also was closed using a running #0 Vicryl suture. A total of 10 cc of transexemic acid (TXA) was injected intra-articularly before the subcutaneous tissues were closed in several layers using 2-0 Vicryl interrupted sutures. The skin was closed using staples. A sterile Praveena dressing was applied to the skin before the leg was wrapped with an Ace wrap to accommodate the Polar Care device. The patient was then awakened, extubated, and returned to the recovery room in satisfactory condition after tolerating the procedure well.

## 2020-03-16 NOTE — Progress Notes (Signed)
PHARMACIST - PHYSICIAN ORDER COMMUNICATION  CONCERNING: P&T Medication Policy on Herbal Medications  DESCRIPTION:  This patient's order for:  Fish Oil 500mg    has been noted.  This product(s) is classified as an "herbal" or natural product. Due to a lack of definitive safety studies or FDA approval, nonstandard manufacturing practices, plus the potential risk of unknown drug-drug interactions while on inpatient medications, the Pharmacy and Therapeutics Committee does not permit the use of "herbal" or natural products of this type within [facility]   ACTION TAKEN: The pharmacy department is unable to verify this order at this time.  Please reevaluate patient's clinical condition at discharge and address if the herbal or natural product(s) should be resumed at that time.  Pernell Dupre, PharmD, BCPS Clinical Pharmacist 03/16/2020 1:24 PM

## 2020-03-16 NOTE — H&P (Signed)
Paper H&P to be scanned into permanent record. H&P reviewed and patient re-examined.    Cardiovascular exam: Regular rate and rhythm without murmurs  Pulmonary exam: Lungs clear to auscultation

## 2020-03-16 NOTE — Evaluation (Signed)
Physical Therapy Evaluation Patient Details Name: [name] MRN: [id number] DOB: March 26, 1946 Today's Date: 03/16/2020   History of Present Illness  Pt is 74 yo female s/p L TKA, WBAT. PMH of anxiety, low back pain, depression, DMII, fibromyalgia, HTN, R THA  Clinical Impression  Pt alert, family at bedside. Reported 5/10 knee pain at rest. Pt on 2L via [place], trialed on room air, spO2>90% throughout, RN notified. Per pt prior to surgery she was independent for ADLs, and would utilize [facility] for community ambulation. No falls to report.  The patient needed physical assist with LLE knee exercises due to pain. Supine to sit with minA for LLE management. Seated EOB for several minutes with fair balance. Sit <> stand with RW and CGA, extended time needed from elevated surface. The patient was able to take several steps to the recliner with RW and CGA, instructed in step to gait pattern to minimize pain. minA to safely lower to chair.  Overall the patient demonstrated deficits (see "PT Problem List") that impede the patient's functional abilities, safety, and mobility and would benefit from skilled PT intervention. Recommendation is HHPT pending pt progress.     Follow Up Recommendations Home health PT    Equipment Recommendations  None recommended by PT;Other (comment)(Pt has RW at home)    Recommendations for Other Services       Precautions / Restrictions Precautions Precautions: Knee Precaution Booklet Issued: Yes (comment) Restrictions Weight Bearing Restrictions: Yes LLE Weight Bearing: Weight bearing as tolerated      Mobility  Bed Mobility Overal bed mobility: Needs Assistance Bed Mobility: Supine to Sit     Supine to sit: HOB elevated;Min assist     General bed mobility comments: minA for LLE management  Transfers Overall transfer level: Needs assistance Equipment used: Rolling walker (2 wheeled) Transfers: Sit to/from Stand Sit to Stand: Min guard;From elevated  surface         General transfer comment: extended time needed but no physical assist  Ambulation/Gait Ambulation/Gait assistance: Min guard Gait Distance (Feet): 1 Feet Assistive device: Rolling walker (2 wheeled)       General Gait Details: Pt instructed in step to gait pattern for pain management, good followthrough noted  Stairs            Wheelchair Mobility    Modified Rankin (Stroke Patients Only)       Balance Overall balance assessment: Needs assistance Sitting-balance support: Feet supported Sitting balance-Leahy Scale: Good       Standing balance-Leahy Scale: Fair Standing balance comment: RW utilized for all standing                             Pertinent Vitals/Pain Pain Assessment: 0-10 Pain Score: 5  Pain Location: L knee Pain Descriptors / Indicators: Aching;Throbbing Pain Intervention(s): Limited activity within patient's tolerance;Repositioned;Monitored during session;Ice applied;Patient requesting pain meds-RN notified    Home Living Family/patient expects to be discharged to:: Private residence Living Arrangements: Spouse/significant other Available Help at Discharge: Available PRN/intermittently Type of Home: House Home Access: [place]: [place]: [place] - single point;Toilet riser;Walker - 2 wheels;Bedside commode;Grab bars - toilet;Wheelchair - manual;Shower seat;Grab bars - tub/shower      Prior Function Level of Independence: Independent with assistive device(s)         Comments: using SPC when "out and about"     Hand Dominance   Dominant Hand:  Right    Extremity/Trunk Assessment   Upper Extremity Assessment Upper Extremity Assessment: Overall WFL for tasks assessed    Lower Extremity Assessment Lower Extremity Assessment: RLE deficits/detail;LLE deficits/detail RLE Deficits / Details: WFLs LLE Deficits / Details: s/p L TKA, assistance needed for heel slides and  SLR    Cervical / Trunk Assessment Cervical / Trunk Assessment: Normal  Communication   Communication: No difficulties  Cognition Arousal/Alertness: Awake/alert Behavior During Therapy: WFL for tasks assessed/performed Overall Cognitive Status: Within Functional Limits for tasks assessed                                        General Comments      Exercises Total Joint Exercises Ankle Circles/Pumps: AROM;Both;10 reps Quad Sets: AROM;Both;10 reps Heel Slides: AAROM;Strengthening;Left;10 reps Hip ABduction/ADduction: AAROM;Strengthening;Left;10 reps Straight Leg Raises: AAROM;Strengthening;Left;10 reps Goniometric ROM: -5 to 60 degrees   Assessment/Plan    PT Assessment Patient needs continued PT services  PT Problem List Decreased strength;Decreased mobility;Decreased range of motion;Decreased knowledge of precautions;Decreased activity tolerance;Decreased balance;Decreased knowledge of use of DME;Pain       PT Treatment Interventions DME instruction;Therapeutic activities;Gait training;Therapeutic exercise;Patient/family education;Stair training;Balance training;Functional mobility training;Neuromuscular re-education    PT Goals (Current goals can be found in the Care Plan section)  Acute Rehab PT Goals Patient Stated Goal: to go home PT Goal Formulation: With patient Time For Goal Achievement: 03/30/20 Potential to Achieve Goals: Good    Frequency BID   Barriers to discharge        Co-evaluation               AM-PAC PT "6 Clicks" Mobility  Outcome Measure Help needed turning from your back to your side while in a flat bed without using bedrails?: A Little Help needed moving from lying on your back to sitting on the side of a flat bed without using bedrails?: A Little Help needed moving to and from a bed to a chair (including a wheelchair)?: A Little Help needed standing up from a chair using your arms (e.g., wheelchair or bedside chair)?: A  Little Help needed to walk in hospital room?: A Lot Help needed climbing 3-5 steps with a railing? : A Lot 6 Click Score: 16    End of Session Equipment Utilized During Treatment: Gait belt Activity Tolerance: Patient tolerated treatment well Patient left: in chair;with chair alarm set;with call bell/phone within reach(polar care in place, heels elevated) Nurse Communication: Mobility status;Patient requests pain meds PT Visit Diagnosis: Other abnormalities of gait and mobility (R26.89);Muscle weakness (generalized) (M62.81);Unsteadiness on feet (R26.81);Pain Pain - Right/Left: Left Pain - part of body: Knee    Time: [id number] PT Time Calculation (min) (ACUTE ONLY): 49 min   Charges:   PT Evaluation $PT Eval Low Complexity: 1 Low PT Treatments $Therapeutic Exercise: 38-52 mins       Lieutenant Diego PT, DPT 4:02 PM,03/16/20

## 2020-03-16 NOTE — Transfer of Care (Signed)
Immediate Anesthesia Transfer of Care Note  Patient: [name]  Procedure(s) Performed: TOTAL KNEE ARTHROPLASTY (Left Knee)  Patient Location: PACU  Anesthesia Type:General  Level of Consciousness: drowsy  Airway & Oxygen Therapy: Patient Spontanous Breathing and Patient connected to face mask oxygen  Post-op Assessment: Report given to RN and Post -op Vital signs reviewed and stable  Post vital signs: Reviewed and stable  Last Vitals:  Vitals Value Taken Time  BP 109/60 03/16/20 1032  Temp    Pulse 83 03/16/20 1035  Resp 18 03/16/20 1035  SpO2 96 % 03/16/20 1035  Vitals shown include unvalidated device data.  Last Pain:  Vitals:   03/16/20 0617  TempSrc: Temporal  PainSc: 5       Patients Stated Pain Goal: 1 ([id number])  Complications: No apparent anesthesia complications

## 2020-03-16 NOTE — Anesthesia Procedure Notes (Signed)
Procedure Name: Intubation Date/Time: 03/16/2020 8:11 AM Performed by: Lia Foyer, CRNA Pre-anesthesia Checklist: Patient identified, Emergency Drugs available, Suction available and Patient being monitored Patient Re-evaluated:Patient Re-evaluated prior to induction Oxygen Delivery Method: Circle system utilized Preoxygenation: Pre-oxygenation with 100% oxygen Induction Type: IV induction Ventilation: Mask ventilation without difficulty Laryngoscope Size: McGraph and 3 Grade View: Grade II Tube type: Oral Tube size: 7.0 mm Number of attempts: 1 Airway Equipment and Method: Stylet,  Oral airway and Video-laryngoscopy Placement Confirmation: ETT inserted through vocal cords under direct vision,  positive ETCO2 and breath sounds checked- equal and bilateral Secured at: 20 cm Tube secured with: Tape Dental Injury: Teeth and Oropharynx as per pre-operative assessment

## 2020-03-16 NOTE — Anesthesia Preprocedure Evaluation (Addendum)
Anesthesia Evaluation  Patient identified by MRN, date of birth, ID band Patient awake    Reviewed: Allergy & Precautions, H&P , NPO status , Patient's Chart, lab work & pertinent test results  Airway Mallampati: II   Neck ROM: full    Dental   Pulmonary shortness of breath, sleep apnea , former smoker,    breath sounds clear to auscultation       Cardiovascular hypertension,  Rhythm:regular Rate:Normal     Neuro/Psych PSYCHIATRIC DISORDERS Anxiety Depression    GI/Hepatic hiatal hernia, GERD  ,  Endo/Other  diabetes, Type 2  Renal/GU      Musculoskeletal  (+) Arthritis , Fibromyalgia -  Abdominal   Peds  Hematology   Anesthesia Other Findings   Reproductive/Obstetrics                             Anesthesia Physical  Anesthesia Plan  ASA: III  Anesthesia Plan: Spinal   Post-op Pain Management:    Induction: Intravenous  PONV Risk Score and Plan:   Airway Management Planned: Nasal Cannula  Additional Equipment:   Intra-op Plan:   Post-operative Plan:   Informed Consent: I have reviewed the patients History and Physical, chart, labs and discussed the procedure including the risks, benefits and alternatives for the proposed anesthesia with the patient or authorized representative who has indicated his/her understanding and acceptance.       Plan Discussed with: CRNA, Anesthesiologist and Surgeon  Anesthesia Plan Comments: (Talked with patient about the risks and benefits of the procedure, including failed spinal and then we will do GOT.)       Anesthesia Quick Evaluation

## 2020-03-17 LAB — BASIC METABOLIC PANEL
Anion gap: 8 (ref 5–15)
BUN: 21 mg/dL (ref 8–23)
CO2: 24 mmol/L (ref 22–32)
Calcium: 8.6 mg/dL — ABNORMAL LOW (ref 8.9–10.3)
Chloride: 104 mmol/L (ref 98–111)
Creatinine, Ser: 0.65 mg/dL (ref 0.44–1.00)
GFR calc Af Amer: >60 mL/min (ref >60)
GFR calc non Af Amer: >60 mL/min (ref >60)
Glucose, Bld: 122 mg/dL — ABNORMAL HIGH (ref 70–99)
Potassium: 3.7 mmol/L (ref 3.5–5.1)
Sodium: 136 mmol/L (ref 135–145)

## 2020-03-17 LAB — GLUCOSE, CAPILLARY
Glucose-Capillary: 105 mg/dL — ABNORMAL HIGH (ref 70–99)
Glucose-Capillary: 106 mg/dL — ABNORMAL HIGH (ref 70–99)
Glucose-Capillary: 114 mg/dL — ABNORMAL HIGH (ref 70–99)
Glucose-Capillary: 112 mg/dL — ABNORMAL HIGH (ref 70–99)
Glucose-Capillary: 117 mg/dL — ABNORMAL HIGH (ref 70–99)

## 2020-03-17 LAB — CBC
HCT: 34.8 % — ABNORMAL LOW (ref 36.0–46.0)
Hemoglobin: 11.4 g/dL — ABNORMAL LOW (ref 12.0–15.0)
MCH: 30.4 pg (ref 26.0–34.0)
MCHC: 32.8 g/dL (ref 30.0–36.0)
MCV: 92.8 fL (ref 80.0–100.0)
Platelets: 145 10*3/uL — ABNORMAL LOW (ref 150–400)
RBC: 3.75 MIL/uL — ABNORMAL LOW (ref 3.87–5.11)
RDW: 12.9 % (ref 11.5–15.5)
WBC: 8.5 10*3/uL (ref 4.0–10.5)
nRBC: 0 % (ref 0.0–0.2)

## 2020-03-17 NOTE — Progress Notes (Signed)
Physical Therapy Treatment Patient Details Name: [name] MRN: [id number] DOB: 03/14/1946 Today's Date: 03/17/2020    History of Present Illness Pt is 74 yo female s/p L TKA, WBAT. PMH of anxiety, low back pain, depression, DMII, fibromyalgia, HTN, R THA    PT Comments    Patient alert, oriented, agreeable to PT asking to use the commode. Pt reported 8/10 pain in L knee. Supine <> sit with minA for LLE due to increased pain this session. Pt performed several sit <> stands with RW and CGA, cued for hand placement and LLE placement. The patient ambulated ~153ft with RW and CGA, utilized step to gait pattern to address elevated pain. Pt returned to bed and RN at bedside to administer pain medication. The patient would benefit from further skilled PT intervention to maximize safety, mobility, and independence.     Follow Up Recommendations  Home health PT     Equipment Recommendations  None recommended by PT;Other (comment)    Recommendations for Other Services       Precautions / Restrictions Precautions Precautions: Knee Precaution Booklet Issued: No Restrictions Weight Bearing Restrictions: Yes LLE Weight Bearing: Weight bearing as tolerated    Mobility  Bed Mobility Overal bed mobility: Needs Assistance Bed Mobility: Supine to Sit;Sit to Supine     Supine to sit: Min assist;HOB elevated Sit to supine: HOB elevated;Min assist   General bed mobility comments: minA for LLE management  Transfers Overall transfer level: Needs assistance Equipment used: Rolling walker (2 wheeled) Transfers: Sit to/from Stand Sit to Stand: Min guard Stand pivot transfers: Min guard       General transfer comment: cued for hand placement and to bring L leg forward to avoid excessive flexion during transfers  Ambulation/Gait Ambulation/Gait assistance: Min guard Gait Distance (Feet): 100 Feet Assistive device: Rolling walker (2 wheeled)       General Gait Details: Pt utilized  step to gait pattern due to increased L knee pain.   Stairs             Wheelchair Mobility    Modified Rankin (Stroke Patients Only)       Balance Overall balance assessment: Needs assistance Sitting-balance support: Feet supported Sitting balance-Leahy Scale: Good       Standing balance-Leahy Scale: Fair                              Cognition Arousal/Alertness: Awake/alert Behavior During Therapy: WFL for tasks assessed/performed Overall Cognitive Status: Within Functional Limits for tasks assessed                                        Exercises Other Exercises Other Exercises: Pt assisted with commode transfers; sit <> stand from [facility] over standard toilet with CGA. Able to perform pericare in standing with CGA. Other Exercises: Provided education on use of adaptive equipment and compensatory techniques to safely perform BADLs Other Exercises: Completed toileting this date with CGA for all aspects and extra time    General Comments        Pertinent Vitals/Pain Pain Assessment: 0-10 Pain Score: 8  Pain Location: L knee Pain Descriptors / Indicators: Aching;Discomfort Pain Intervention(s): Limited activity within patient's tolerance;Monitored during session;Premedicated before session;Repositioned;Ice applied    Home Living Family/patient expects to be discharged to:: Private residence Living Arrangements: Spouse/significant other Available Help at  Discharge: Available PRN/intermittently Type of Home: House Home Access: Ramped entrance   Home Layout: One level Home Equipment: [place] - single point;Toilet riser;Walker - 2 wheels;Bedside commode;Grab bars - toilet;Wheelchair - manual;Shower seat;Grab bars - tub/shower      Prior Function Level of Independence: Independent with assistive device(s)      Comments: using SPC when "out and about". I for all (B)ADLs.   PT Goals (current goals can now be found in the care plan  section) Acute Rehab PT Goals Patient Stated Goal: "Do how I was at home" Progress towards PT goals: Progressing toward goals    Frequency    BID      PT Plan Current plan remains appropriate    Co-evaluation              AM-PAC PT "6 Clicks" Mobility   Outcome Measure  Help needed turning from your back to your side while in a flat bed without using bedrails?: None Help needed moving from lying on your back to sitting on the side of a flat bed without using bedrails?: None Help needed moving to and from a bed to a chair (including a wheelchair)?: None Help needed standing up from a chair using your arms (e.g., wheelchair or bedside chair)?: A Little Help needed to walk in hospital room?: None Help needed climbing 3-5 steps with a railing? : A Little 6 Click Score: 22    End of Session Equipment Utilized During Treatment: Gait belt Activity Tolerance: Patient tolerated treatment well Patient left: with call bell/phone within reach;in bed;with bed alarm set;with SCD's reapplied;with nursing/sitter in room Nurse Communication: Mobility status;Patient requests pain meds PT Visit Diagnosis: Other abnormalities of gait and mobility (R26.89);Muscle weakness (generalized) (M62.81);Unsteadiness on feet (R26.81);Pain Pain - Right/Left: Left Pain - part of body: Knee     Time: [id number] PT Time Calculation (min) (ACUTE ONLY): 35 min  Charges:  $Therapeutic Exercise: 8-22 mins $Therapeutic Activity: 8-22 mins                     Lieutenant Diego PT, DPT 2:42 PM,03/17/20

## 2020-03-17 NOTE — Anesthesia Postprocedure Evaluation (Signed)
Anesthesia Post Note  Patient: [name]  Procedure(s) Performed: TOTAL KNEE ARTHROPLASTY (Left Knee)  Anesthesia Type: General Level of consciousness: awake and alert and oriented Pain management: pain level controlled Vital Signs Assessment: post-procedure vital signs reviewed and stable Respiratory status: spontaneous breathing Cardiovascular status: blood pressure returned to baseline Anesthetic complications: no     Last Vitals:  Vitals:   03/17/20 0420 03/17/20 0811  BP: 120/62 111/67  Pulse: 81 77  Resp: 16 16  Temp: 36.8 C 36.9 C  SpO2: 93% 93%    Last Pain:  Vitals:   03/17/20 0811  TempSrc: Oral  PainSc:                  Denson Niccoli

## 2020-03-17 NOTE — Progress Notes (Signed)
Physical Therapy Treatment Patient Details Name: [name] MRN: [id number] DOB: 03/21/1946 Today's Date: 03/17/2020    History of Present Illness Pt is 74 yo female s/p L TKA, WBAT. PMH of anxiety, low back pain, depression, DMII, fibromyalgia, HTN, R THA    PT Comments    Pt alert, agreeable to PT, reported 5/10 knee pain. The patient demonstrated great progress towards goals this session. Bed mobility with supervision, sit <> stand from elevated bed surface with CGA and RW, cues for hand placement. The patient ambulated ~138ft with RW and CGA/supervision. Instructed in step through gait pattern, pt exhibited decreased gait velocity and step/stride length of LLE but overall steady. Pt up in chair with all needs in reach. The patient would benefit from further skilled PT to continue to progress towards goals.     Follow Up Recommendations  Home health PT     Equipment Recommendations  None recommended by PT;Other (comment)(Pt has RW at home)    Recommendations for Other Services       Precautions / Restrictions Precautions Precautions: Knee Precaution Booklet Issued: Yes (comment) Restrictions Weight Bearing Restrictions: Yes LLE Weight Bearing: Weight bearing as tolerated    Mobility  Bed Mobility Overal bed mobility: Needs Assistance Bed Mobility: Supine to Sit     Supine to sit: Supervision;HOB elevated        Transfers Overall transfer level: Needs assistance Equipment used: Rolling walker (2 wheeled) Transfers: Sit to/from Stand Sit to Stand: Min guard;From elevated surface         General transfer comment: extended time needed but no physical assist  Ambulation/Gait Ambulation/Gait assistance: Min guard Gait Distance (Feet): 190 Feet Assistive device: Rolling walker (2 wheeled)   Gait velocity: decreased   General Gait Details: Pt able to perform step to gait pattern well, instructed in step through with good carryover.   Stairs              Wheelchair Mobility    Modified Rankin (Stroke Patients Only)       Balance Overall balance assessment: Needs assistance Sitting-balance support: Feet supported Sitting balance-Leahy Scale: Good       Standing balance-Leahy Scale: Fair Standing balance comment: RW utilized for all standing                            Cognition Arousal/Alertness: Awake/alert Behavior During Therapy: WFL for tasks assessed/performed Overall Cognitive Status: Within Functional Limits for tasks assessed                                        Exercises Total Joint Exercises Long Arc Quad: AAROM;Strengthening;Left;10 reps Knee Flexion: AROM;Strengthening;Left;10 reps    General Comments        Pertinent Vitals/Pain Pain Assessment: 0-10 Pain Score: 5  Pain Location: L knee Pain Descriptors / Indicators: Nagging Pain Intervention(s): Limited activity within patient's tolerance;Premedicated before session;Repositioned;Ice applied;Patient requesting pain meds-RN notified    Home Living                      Prior Function            PT Goals (current goals can now be found in the care plan section) Progress towards PT goals: Progressing toward goals    Frequency    BID      PT Plan Current  plan remains appropriate    Co-evaluation              AM-PAC PT "6 Clicks" Mobility   Outcome Measure  Help needed turning from your back to your side while in a flat bed without using bedrails?: None Help needed moving from lying on your back to sitting on the side of a flat bed without using bedrails?: None Help needed moving to and from a bed to a chair (including a wheelchair)?: None Help needed standing up from a chair using your arms (e.g., wheelchair or bedside chair)?: A Little Help needed to walk in hospital room?: None Help needed climbing 3-5 steps with a railing? : A Little 6 Click Score: 22    End of Session Equipment  Utilized During Treatment: Gait belt Activity Tolerance: Patient tolerated treatment well Patient left: in chair;with call bell/phone within reach;with nursing/sitter in room Nurse Communication: Mobility status;Patient requests pain meds PT Visit Diagnosis: Other abnormalities of gait and mobility (R26.89);Muscle weakness (generalized) (M62.81);Unsteadiness on feet (R26.81);Pain Pain - Right/Left: Left Pain - part of body: Knee     Time: [id number] PT Time Calculation (min) (ACUTE ONLY): 36 min  Charges:  $Gait Training: 8-22 mins $Therapeutic Exercise: 8-22 mins                     Lieutenant Diego PT, DPT 9:54 AM,03/17/20

## 2020-03-17 NOTE — Progress Notes (Signed)
Subjective: 1 Day Post-Op Procedure(s) (LRB): TOTAL KNEE ARTHROPLASTY (Left) Patient reports pain as mild.   Patient is well, and has had no acute complaints or problems Plan is to go home versus Rehab after hospital stay. Negative for chest pain and shortness of breath Fever: no Gastrointestinal: Negative for nausea and vomiting  Objective: Vital signs in last 24 hours: Temp:  [97.6 F (36.4 C)-98.5 F (36.9 C)] 98.3 F (36.8 C) (03/24 0420) Pulse Rate:  [72-115] 81 (03/24 0420) Resp:  [8-29] 16 (03/24 0420) BP: (99-124)/(51-77) 120/62 (03/24 0420) SpO2:  [87 %-96 %] 93 % (03/24 0420)  Intake/Output from previous day:  Intake/Output Summary (Last 24 hours) at 03/17/2020 0654 Last data filed at 03/16/2020 1946 Gross per 24 hour  Intake 1848.42 ml  Output 610 ml  Net 1238.42 ml    Intake/Output this shift: Total I/O In: 120 [P.O.:120] Out: 600 [Urine:600]  Labs: Recent Labs    03/16/20 0629 03/17/20 0435  HGB 13.9 11.4*   Recent Labs    03/16/20 0629 03/17/20 0435  WBC  --  8.5  RBC  --  3.75*  HCT 41.0 34.8*  PLT  --  145*   Recent Labs    03/16/20 0629 03/17/20 0435  NA 139 136  K 3.5 3.7  CL 105 104  CO2  --  24  BUN 18 21  CREATININE 0.60 0.65  GLUCOSE 98 122*  CALCIUM  --  8.6*   No results for input(s): LABPT, INR in the last 72 hours.   EXAM General - Patient is Alert and Oriented Extremity - Neurovascular intact Sensation intact distally Dorsiflexion/Plantar flexion intact Compartment soft Dressing/Incision - clean, dry, with the wound VAC in place Motor Function - intact, moving foot and toes well on exam.  Able to straight leg raise with mild assistance.  Past Medical History:  Diagnosis Date  . Allergy    SEASONAL-flonase daily as well as Claritin  . Anemia    takes Ferrous Sulfate daily  . Anxiety    takes Celexa every evening  . Arthritis    BACK/KNEE  . Cataract    IMMATURE AND ON LEFT EYE SHE THINKS  . Chronic back  pain   . Chronic back pain    L1 FRACTURE  . Depression    takes Wellbutrin daily  . Diabetes mellitus without complication ([place])    type II  . Dyspnea    with exertion  . Fibromyalgia   . GERD (gastroesophageal reflux disease)    takes Omeprazole daily and Zantac at bedtime  . H/O hiatal hernia   . History of colon polyps   . History of shingles   . Hyperlipidemia    takes Pravastatin daily as well as Questran  . Hypertension    takes Diovan daily  . Hypothyroidism   . Insomnia    takes trazodone nightly  . Joint pain   . Muscle spasm    takes Robaxin daily as needed  . Nausea    takes Zofran daily as needed  . Nocturia   . Numbness and tingling    LEGS  . Osteoporosis    takes fosamax  . Sleep apnea    uses cpap  . Thyroid disease    takes Synthroid daily    Assessment/Plan: 1 Day Post-Op Procedure(s) (LRB): TOTAL KNEE ARTHROPLASTY (Left) Active Problems:   Status post total knee replacement using cement, left  Estimated body mass index is 44.09 kg/m as calculated from the following:  Height as of this encounter: 4' 11.5" (1.511 m).   Weight as of this encounter: 100.7 kg. Advance diet Up with therapy D/C IV fluids  Discharge home versus rehab after physical therapy and care management evaluates patient.  DVT Prophylaxis - Lovenox, Foot Pumps and TED hose Weight-Bearing as tolerated to left leg  Reche Dixon, PA-C Orthopaedic Surgery 03/17/2020, 6:54 AM

## 2020-03-17 NOTE — Evaluation (Signed)
Occupational Therapy Evaluation Patient Details Name: [name] MRN: [id number] DOB: 26-Mar-1946 Today's Date: 03/17/2020    History of Present Illness Pt is 74 yo female s/p L TKA, WBAT. PMH of anxiety, low back pain, depression, DMII, fibromyalgia, HTN, R THA   Clinical Impression   Upon entry, patient in bed with husband at beside watching TV together.  Patient agreeable to therapy.  Previously, patient was living with husband and MOD I for all (B)ADLs.  Reports using SPC for community mobility.  Patient states her pain is getting better, but is at a 4-5/10 with movement.  Demonstrates good understanding of precautions associated with surgery.  Presents with deficits including ADL performance, activity tolerance, strength, standing tolerance, functional transfers/mobility, and safety awareness and would benefit from further skilled occupational therapy treatment.  Provided education on adaptive equipment and compensatory techniques to perform ADLs with increased independence.  Patient verbalizes understanding.  Patient states she has most AE/DME at home but will look in to getting a long handled sponge.  Based on today's performance, recommending [place] OT.    Follow Up Recommendations  Home health OT    Equipment Recommendations  None recommended by OT    Recommendations for Other Services       Precautions / Restrictions Precautions Precautions: Knee Precaution Booklet Issued: Yes (comment) Restrictions Weight Bearing Restrictions: Yes LLE Weight Bearing: Weight bearing as tolerated      Mobility Bed Mobility Overal bed mobility: Needs Assistance Bed Mobility: Supine to Sit;Sit to Supine     Supine to sit: Min guard;HOB elevated;Min assist Sit to supine: Min guard;HOB elevated;Min assist      Transfers Overall transfer level: Needs assistance Equipment used: Rolling walker (2 wheeled) Transfers: Sit to/from [employer] Sit to Stand: Min guard;From  elevated surface Stand pivot transfers: Min guard       General transfer comment: good use of body mechanics and sequencing for safety    Balance Overall balance assessment: Needs assistance                            ADL either performed or assessed with clinical judgement   ADL Overall ADL's : Needs assistance/impaired                     Lower Body Dressing: Minimal assistance;Cueing for compensatory techniques;Sit to/from stand   Toilet Transfer: Min guard;Stand-pivot;RW   Toileting- Water quality scientist and Hygiene: Min guard;Sit to/from stand       Functional mobility during ADLs: Min guard;Rolling walker General ADL Comments: Demonstrates good safety awareness and precautions associated with L knee.  Patient reports she has a reacher at home but would like a long handled sponge.     Vision Baseline Vision/History: Wears glasses Patient Visual Report: No change from baseline       Perception     Praxis      Pertinent Vitals/Pain Pain Assessment: 0-10 Pain Score: 4  Pain Location: L knee Pain Descriptors / Indicators: Aching;Discomfort Pain Intervention(s): Limited activity within patient's tolerance;Monitored during session;Premedicated before session;Repositioned;Ice applied     Hand Dominance Right   Extremity/Trunk Assessment Upper Extremity Assessment Upper Extremity Assessment: Overall WFL for tasks assessed   Lower Extremity Assessment Lower Extremity Assessment: Defer to PT evaluation   Cervical / Trunk Assessment Cervical / Trunk Assessment: Normal   Communication Communication Communication: No difficulties   Cognition Arousal/Alertness: Awake/alert Behavior During Therapy: WFL for tasks assessed/performed Overall  Cognitive Status: Within Functional Limits for tasks assessed                                     General Comments       Exercises  Other Exercises Other Exercises: Provided education  on goals and role of OT in acute care setting Other Exercises: Provided education on use of adaptive equipment and compensatory techniques to safely perform BADLs Other Exercises: Completed toileting this date with CGA for all aspects and extra time   Shoulder Instructions      Home Living Family/patient expects to be discharged to:: Private residence Living Arrangements: Spouse/significant other Available Help at Discharge: Available PRN/intermittently Type of Home: House Home Access: Ramped entrance     Home Layout: One level     Bathroom Shower/Tub: Teacher, early years/pre: Standard Bathroom Accessibility: Yes   Home Equipment: Cane - single point;Toilet riser;Walker - 2 wheels;Bedside commode;Grab bars - toilet;Wheelchair - manual;Shower seat;Grab bars - tub/shower          Prior Functioning/Environment Level of Independence: Independent with assistive device(s)        Comments: using SPC when "out and about". I for all (B)ADLs.        OT Problem List: Decreased activity tolerance;Decreased knowledge of use of DME or AE;Decreased knowledge of precautions      OT Treatment/Interventions: Self-care/ADL training;Therapeutic exercise;Energy conservation;DME and/or AE instruction;Therapeutic activities;Patient/family education    OT Goals(Current goals can be found in the care plan section) Acute Rehab OT Goals Patient Stated Goal: "Do how I was at home" OT Goal Formulation: With patient Time For Goal Achievement: 03/31/20 Potential to Achieve Goals: Good  OT Frequency: Min 1X/week   Barriers to D/C:            Co-evaluation              AM-PAC OT "6 Clicks" Daily Activity     Outcome Measure Help from another person eating meals?: None Help from another person taking care of personal grooming?: None Help from another person toileting, which includes using toliet, bedpan, or urinal?: A Little Help from another person bathing (including  washing, rinsing, drying)?: A Lot Help from another person to put on and taking off regular upper body clothing?: None Help from another person to put on and taking off regular lower body clothing?: A Little 6 Click Score: 20   End of Session Equipment Utilized During Treatment: Gait belt;Rolling walker  Activity Tolerance: Patient tolerated treatment well Patient left: in bed;with call bell/phone within reach;with bed alarm set;with family/visitor present;with SCD's reapplied;Other (comment)(polar care in place)  OT Visit Diagnosis: Unsteadiness on feet (R26.81);Other (comment)(L TKR)                Time: [id number] OT Time Calculation (min): 39 min Charges:  OT General Charges $OT Visit: 1 Visit OT Evaluation $OT Eval Moderate Complexity: 1 Mod OT Treatments $Self Care/Home Management : 38-52 mins  Baldomero Lamy, MS, OTR/L 03/17/20, 12:26 PM

## 2020-03-17 NOTE — TOC Initial Note (Signed)
Transition of Care [facility] [place]) - Initial/Assessment Note    Patient Details  Name: LATRENA BENEGAS MRN: [id number] Date of Birth: 03-11-1946  [facility]) CM/SW Contact:    Elease Hashimoto, LCSW Phone Number: 03/17/2020, 11:41 AM  Clinical Narrative: Met with pt and husband who is at bedside, he will be there to assist at discharge. Pt feels she is doing well and had pian prior to admission but feels much better now. She has all of the equipment at home and aware [facility] will be providing HHPT. Pt has PCP and insurance and will be ready for DC tomorrow. Continue to follow and assist with discharge needs.               Expected Discharge Plan: [place] Barriers to Discharge: Continued Medical Work up   Patient Goals and CMS Choice Patient states their goals for this hospitalization and ongoing recovery are:: Go home tomorrow CMS Medicare.gov Compare Post Acute Care list provided to:: Patient Choice offered to / list presented to : Patient  Expected Discharge Plan and Services Expected Discharge Plan: [place] In-house Referral: Clinical Social Work   Post Acute Care Choice: [place] arrangements for the past 2 months: [place]: PT [place]: [facility] (now [facility]) Date [place]: 03/17/20 Time [place]: 27 Representative spoke with at [place]: teresa  Prior Living Arrangements/Services Living arrangements for the past 2 months: [place] with:: Spouse          Need for Family Participation in Patient Care: Yes (Comment) Care giver support system in place?: Yes (comment) Current home services: DME(has rw, bsc and cane)    Activities of Daily Living Home Assistive Devices/Equipment: CPAP, Eyeglasses ADL Screening (condition at time of admission) Patient's cognitive ability adequate to safely complete daily  activities?: Yes Is the patient deaf or have difficulty hearing?: No Does the patient have difficulty seeing, even when wearing glasses/contacts?: No Does the patient have difficulty concentrating, remembering, or making decisions?: No Patient able to express need for assistance with ADLs?: Yes Does the patient have difficulty dressing or bathing?: No Independently performs ADLs?: Yes (appropriate for developmental age) Does the patient have difficulty walking or climbing stairs?: Yes Weakness of Legs: None Weakness of Arms/Hands: None  Permission Sought/Granted   Permission granted to share information with : Yes, Verbal Permission Granted  Share Information with NAME: Husband and teresa  Permission granted to share info w AGENCY: [facility]        Emotional Assessment Appearance:: Appears stated age Attitude/Demeanor/Rapport: Engaged Affect (typically observed): Adaptable, Accepting Orientation: : Oriented to Self, Oriented to Place, Oriented to  Time, Oriented to Situation      Admission diagnosis:  Status post total knee replacement using cement, left [Z96.652] Patient Active Problem List   Diagnosis Date Noted  . 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 S/P total hip arthroplasty 10/18/2016  . Hypersomnia with sleep apnea 07/20/2015  . Hypersomnia, persistent 07/20/2015  . OSA (obstructive sleep apnea) 07/20/2015  . Lumbar compression fracture ([place]) 12/21/2014  . Morbid obesity ([place]) 03/07/2013  . Obstructive sleep apnea 03/07/2013  . Fibromyalgia 03/07/2013  . Other and unspecified hyperlipidemia 03/07/2013  . Hx of gastritis 03/07/2013  .  S/P cholecystectomy 03/07/2013  . Chest pain 01/07/2013   PCP:  Prince Solian, MD Pharmacy:   [id number] [place]Phone: [phone]: [phone]    Social Determinants of Health (SDOH)  Interventions    Readmission Risk Interventions No flowsheet data found.

## 2020-03-18 LAB — CBC
HCT: 37.1 % (ref 36.0–46.0)
Hemoglobin: 12.6 g/dL (ref 12.0–15.0)
MCH: 31 pg (ref 26.0–34.0)
MCHC: 34 g/dL (ref 30.0–36.0)
MCV: 91.2 fL (ref 80.0–100.0)
Platelets: 153 10*3/uL (ref 150–400)
RBC: 4.07 MIL/uL (ref 3.87–5.11)
RDW: 13.1 % (ref 11.5–15.5)
WBC: 9 10*3/uL (ref 4.0–10.5)
nRBC: 0 % (ref 0.0–0.2)

## 2020-03-18 LAB — GLUCOSE, CAPILLARY
Glucose-Capillary: 101 mg/dL — ABNORMAL HIGH (ref 70–99)
Glucose-Capillary: 106 mg/dL — ABNORMAL HIGH (ref 70–99)
Glucose-Capillary: 125 mg/dL — ABNORMAL HIGH (ref 70–99)
Glucose-Capillary: 111 mg/dL — ABNORMAL HIGH (ref 70–99)

## 2020-03-18 LAB — BASIC METABOLIC PANEL
Anion gap: 6 (ref 5–15)
BUN: 15 mg/dL (ref 8–23)
CO2: 29 mmol/L (ref 22–32)
Calcium: 8.7 mg/dL — ABNORMAL LOW (ref 8.9–10.3)
Chloride: 105 mmol/L (ref 98–111)
Creatinine, Ser: 0.61 mg/dL (ref 0.44–1.00)
GFR calc Af Amer: >60 mL/min (ref >60)
GFR calc non Af Amer: >60 mL/min (ref >60)
Glucose, Bld: 106 mg/dL — ABNORMAL HIGH (ref 70–99)
Potassium: 3.7 mmol/L (ref 3.5–5.1)
Sodium: 140 mmol/L (ref 135–145)

## 2020-03-18 MED ORDER — TIZANIDINE HCL 2 MG PO TABS
2.0000 mg | ORAL_TABLET | Freq: Three times a day (TID) | ORAL | Status: DC | PRN
  Administered 2020-03-18: 2 mg via ORAL
  Filled 2020-03-18 (×2): qty 1

## 2020-03-18 MED ORDER — ENOXAPARIN SODIUM 40 MG/0.4ML ~~LOC~~ SOLN: 40.0000 mg | SUBCUTANEOUS | 0 refills | Status: DC

## 2020-03-18 MED ORDER — OXYCODONE HCL 5 MG PO TABS: 5.0000 mg | ORAL_TABLET | ORAL | 0 refills | Status: DC | PRN

## 2020-03-18 MED ORDER — TRAMADOL HCL 50 MG PO TABS
50.0000 mg | ORAL_TABLET | Freq: Four times a day (QID) | ORAL | Status: DC | PRN
  Administered 2020-03-18: 100 mg via ORAL
  Administered 2020-03-18: 50 mg via ORAL
  Administered 2020-03-19: 100 mg via ORAL
  Filled 2020-03-18 (×2): qty 2
  Filled 2020-03-18: qty 1

## 2020-03-18 MED ORDER — TIZANIDINE HCL 2 MG PO TABS: 2.0000 mg | ORAL_TABLET | Freq: Three times a day (TID) | ORAL | 0 refills | Status: DC | PRN

## 2020-03-18 MED ORDER — TRAMADOL HCL 50 MG PO TABS: 50.0000 mg | ORAL_TABLET | Freq: Four times a day (QID) | ORAL | 0 refills | Status: AC | PRN

## 2020-03-18 NOTE — Progress Notes (Signed)
Physical Therapy Treatment Patient Details Name: [name] MRN: [id number] DOB: 03/17/1946 Today's Date: 03/18/2020    History of Present Illness Pt is 74 yo female s/p L TKA, WBAT. PMH of anxiety, low back pain, depression, DMII, fibromyalgia, HTN, R THA    PT Comments    Pt alert, agreeable to PT, stated her pain was 4/10 at rest. The patient demonstrated improved tolerance to activity this session, able to perform supine to sit with minA for LLE management, sit <> stand several times (from elevated bed, standard commode) CGA/MinA with RW. The patient was able to ambulate in room to bathroom, door and to the chair with step to gait pattern, extended time needed due to increased pain, but CGA throughout. Pt up in chair, all needs in reach. The patient would benefit from further skilled PT intervention to continue to progress towards goals.     Follow Up Recommendations  Home health PT     Equipment Recommendations  None recommended by PT;Other (comment)    Recommendations for Other Services       Precautions / Restrictions Precautions Precautions: Fall;Knee Precaution Booklet Issued: No Restrictions Weight Bearing Restrictions: Yes LLE Weight Bearing: Weight bearing as tolerated    Mobility  Bed Mobility Overal bed mobility: Needs Assistance Bed Mobility: Supine to Sit     Supine to sit: Min assist     General bed mobility comments: LLE assist  Transfers Overall transfer level: Needs assistance Equipment used: Rolling walker (2 wheeled) Transfers: Sit to/from Stand Sit to Stand: Min assist;Min guard         General transfer comment: Pt with multiple transfers, minA/CGA, cues for hand placement  Ambulation/Gait   Gait Distance (Feet): 35 Feet Assistive device: Rolling walker (2 wheeled)   Gait velocity: decreased   General Gait Details: Pt able to ambulate to bathroom and back, to door and back. step to gait pattern   Stairs              Wheelchair Mobility    Modified Rankin (Stroke Patients Only)       Balance Overall balance assessment: Needs assistance Sitting-balance support: Feet supported Sitting balance-Leahy Scale: Good       Standing balance-Leahy Scale: Fair                              Cognition Arousal/Alertness: Awake/alert Behavior During Therapy: WFL for tasks assessed/performed Overall Cognitive Status: Within Functional Limits for tasks assessed                                        Exercises Other Exercises Other Exercises: Pt assisted with commode transfer; minA from standard toilet due to uneven BSC legs. assisted with pericare    General Comments        Pertinent Vitals/Pain Pain Assessment: 0-10 Pain Score: 4  Pain Location: L knee Pain Descriptors / Indicators: Throbbing Pain Intervention(s): Limited activity within patient's tolerance;Repositioned;Monitored during session;Premedicated before session;Ice applied    Home Living                      Prior Function            PT Goals (current goals can now be found in the care plan section) Progress towards PT goals: Progressing toward goals    Frequency  BID      PT Plan Current plan remains appropriate    Co-evaluation              AM-PAC PT "6 Clicks" Mobility   Outcome Measure  Help needed turning from your back to your side while in a flat bed without using bedrails?: None Help needed moving from lying on your back to sitting on the side of a flat bed without using bedrails?: None Help needed moving to and from a bed to a chair (including a wheelchair)?: None Help needed standing up from a chair using your arms (e.g., wheelchair or bedside chair)?: A Little Help needed to walk in hospital room?: None Help needed climbing 3-5 steps with a railing? : A Little 6 Click Score: 22    End of Session Equipment Utilized During Treatment: Gait belt Activity  Tolerance: Patient limited by pain;Patient tolerated treatment well Patient left: with call bell/phone within reach;with SCD's reapplied;with nursing/sitter in room;with chair alarm set;in chair(LLE heel elevated, polar care in place) Nurse Communication: Mobility status PT Visit Diagnosis: Other abnormalities of gait and mobility (R26.89);Muscle weakness (generalized) (M62.81);Unsteadiness on feet (R26.81);Pain Pain - Right/Left: Left Pain - part of body: Knee     Time: 1341-1420 PT Time Calculation (min) (ACUTE ONLY): 39 min  Charges:  $Therapeutic Exercise: 38-52 mins                    Lieutenant Diego PT, DPT 2:44 PM,03/18/20

## 2020-03-18 NOTE — Progress Notes (Signed)
PT Cancellation Note  Patient Details Name: [name] MRN: [id number] DOB: April 05, 1946   Cancelled Treatment:    Reason Eval/Treat Not Completed: Other (comment)(Pt reported 8/10 pain, getting ready to brush her teeth. Requested pain medicine prior to PT, PT alerted RN.)  Lieutenant Diego PT, DPT 8:37 AM,03/18/20

## 2020-03-18 NOTE — Progress Notes (Signed)
Physical Therapy Treatment Patient Details Name: [name] MRN: [id number] DOB: 06/09/1946 Today's Date: 03/18/2020    History of Present Illness Pt is 74 yo female s/p L TKA, WBAT. PMH of anxiety, low back pain, depression, DMII, fibromyalgia, HTN, R THA    PT Comments    Patient alert, premedicated prior to PT session, but reported 8/10 pain at rest, and 10/10 pain with mobility/exercises. Session limited due to pt elevated pain. The patient needed assistance with all exercises this morning, as well as increased assistance with bed mobility and transfers. The patient was able to sit <> stand with minA and RW and take a few steps towards [name], further mobility deferred. Repositioned in bed, all needs in reach and RN at bedside to address pain complaints.      Follow Up Recommendations  Home health PT     Equipment Recommendations  None recommended by PT;Other (comment)    Recommendations for Other Services       Precautions / Restrictions Precautions Precautions: Fall;Knee Restrictions Weight Bearing Restrictions: Yes LLE Weight Bearing: Weight bearing as tolerated    Mobility  Bed Mobility Overal bed mobility: Needs Assistance Bed Mobility: Supine to Sit;Sit to Supine     Supine to sit: Mod assist Sit to supine: Min assist   General bed mobility comments: increased assistance needed this session due to pt pain  Transfers Overall transfer level: Needs assistance Equipment used: Rolling walker (2 wheeled) Transfers: Sit to/from Stand Sit to Stand: Min assist;From elevated surface         General transfer comment: pt with significant diffculty of weight bearing through LLE this session  Ambulation/Gait             General Gait Details: Pt able to take several steps laterally towards HOB. Further mobility deferred due to pt reported pain   Stairs             Wheelchair Mobility    Modified Rankin (Stroke Patients Only)       Balance  Overall balance assessment: Needs assistance Sitting-balance support: Feet supported Sitting balance-Leahy Scale: Good       Standing balance-Leahy Scale: Poor                              Cognition Arousal/Alertness: Awake/alert Behavior During Therapy: WFL for tasks assessed/performed Overall Cognitive Status: Within Functional Limits for tasks assessed                                        Exercises Total Joint Exercises Ankle Circles/Pumps: AROM;Both;10 reps Quad Sets: AROM;Both;5 reps Short Arc Quad: AAROM;Strengthening;Left;10 reps Heel Slides: AAROM;Strengthening;Left;10 reps Hip ABduction/ADduction: AAROM;Strengthening;Left;10 reps Goniometric ROM: -5 to 50 degrees, limited by pain    General Comments        Pertinent Vitals/Pain Pain Assessment: 0-10 Pain Score: 10-Worst pain ever Pain Location: L knee Pain Descriptors / Indicators: Throbbing Pain Intervention(s): Limited activity within patient's tolerance;Monitored during session;Premedicated before session;Repositioned;Ice applied    Home Living                      Prior Function            PT Goals (current goals can now be found in the care plan section) Progress towards PT goals: Progressing toward goals    Frequency  BID      PT Plan Current plan remains appropriate    Co-evaluation              AM-PAC PT "6 Clicks" Mobility   Outcome Measure  Help needed turning from your back to your side while in a flat bed without using bedrails?: None Help needed moving from lying on your back to sitting on the side of a flat bed without using bedrails?: None Help needed moving to and from a bed to a chair (including a wheelchair)?: None Help needed standing up from a chair using your arms (e.g., wheelchair or bedside chair)?: A Little Help needed to walk in hospital room?: None Help needed climbing 3-5 steps with a railing? : A Little 6 Click Score:  22    End of Session Equipment Utilized During Treatment: Gait belt Activity Tolerance: Patient limited by pain Patient left: with call bell/phone within reach;in bed;with bed alarm set;with SCD's reapplied;with nursing/sitter in room Nurse Communication: Mobility status;Patient requests pain meds PT Visit Diagnosis: Other abnormalities of gait and mobility (R26.89);Muscle weakness (generalized) (M62.81);Unsteadiness on feet (R26.81);Pain Pain - Right/Left: Left Pain - part of body: Knee     Time: [id number] PT Time Calculation (min) (ACUTE ONLY): 38 min  Charges:  $Therapeutic Exercise: 38-52 mins                    Lieutenant Diego PT, DPT 10:34 AM,03/18/20

## 2020-03-18 NOTE — Discharge Instructions (Signed)

## 2020-03-18 NOTE — Progress Notes (Signed)
Subjective: 2 Days Post-Op Procedure(s) (LRB): TOTAL KNEE ARTHROPLASTY (Left) Patient reports pain as moderate, increased from yesterday.  Patient is well, and has had no acute complaints or problems Plan is to go home with HHPT. Negative for chest pain and shortness of breath Fever: no Gastrointestinal: Negative for nausea and vomiting Reports she is passing gas.  Objective: Vital signs in last 24 hours: Temp:  [97.9 F (36.6 C)-98.6 F (37 C)] 98.6 F (37 C) (03/25 0742) Pulse Rate:  [80-85] 80 (03/25 0742) Resp:  [16] 16 (03/25 0742) BP: (137-149)/(62-76) 138/62 (03/25 0742) SpO2:  [92 %-95 %] 94 % (03/25 0742)  Intake/Output from previous day:  Intake/Output Summary (Last 24 hours) at 03/18/2020 1557 Last data filed at 03/18/2020 1045 Gross per 24 hour  Intake --  Output 2700 ml  Net -2700 ml    Intake/Output this shift: Total I/O In: -  Out: 900 [Urine:900]  Labs: Recent Labs    03/16/20 0629 03/17/20 0435 03/18/20 0438  HGB 13.9 11.4* 12.6   Recent Labs    03/17/20 0435 03/18/20 0438  WBC 8.5 9.0  RBC 3.75* 4.07  HCT 34.8* 37.1  PLT 145* 153   Recent Labs    03/17/20 0435 03/18/20 0438  NA 136 140  K 3.7 3.7  CL 104 105  CO2 24 29  BUN 21 15  CREATININE 0.65 0.61  GLUCOSE 122* 106*  CALCIUM 8.6* 8.7*   No results for input(s): LABPT, INR in the last 72 hours.   EXAM General - Patient is Alert, Appropriate and Oriented Extremity - Neurovascular intact Sensation intact distally Dorsiflexion/Plantar flexion intact Compartment soft Dressing/Incision - clean, dry, with the wound VAC in place without drainage. Motor Function - intact, moving foot and toes well on exam.  Able to straight leg raise with mild assistance. Abdomen with mild distention with normal bowel sounds.  Past Medical History:  Diagnosis Date  . Allergy    SEASONAL-flonase daily as well as Claritin  . Anemia    takes Ferrous Sulfate daily  . Anxiety    takes  Celexa every evening  . Arthritis    BACK/KNEE  . Cataract    IMMATURE AND ON LEFT EYE SHE THINKS  . Chronic back pain   . Chronic back pain    L1 FRACTURE  . Depression    takes Wellbutrin daily  . Diabetes mellitus without complication ([place])    type II  . Dyspnea    with exertion  . Fibromyalgia   . GERD (gastroesophageal reflux disease)    takes Omeprazole daily and Zantac at bedtime  . H/O hiatal hernia   . History of colon polyps   . History of shingles   . Hyperlipidemia    takes Pravastatin daily as well as Questran  . Hypertension    takes Diovan daily  . Hypothyroidism   . Insomnia    takes trazodone nightly  . Joint pain   . Muscle spasm    takes Robaxin daily as needed  . Nausea    takes Zofran daily as needed  . Nocturia   . Numbness and tingling    LEGS  . Osteoporosis    takes fosamax  . Sleep apnea    uses cpap  . Thyroid disease    takes Synthroid daily    Assessment/Plan: 2 Days Post-Op Procedure(s) (LRB): TOTAL KNEE ARTHROPLASTY (Left) Active Problems:   Status post total knee replacement using cement, left  Estimated body mass index is 44.09  kg/m as calculated from the following:   Height as of this encounter: 4' 11.5" (1.511 m).   Weight as of this encounter: 100.7 kg. Advance diet Up with therapy   Labs reviewed this AM. WBC 9.0, Hg 12.6 Current plan is for discharge home with HHPT. Patient has had a BM. Plan for possible d/c home today.  DVT Prophylaxis - Lovenox, Foot Pumps and TED hose Weight-Bearing as tolerated to left leg  J. Cameron Proud, PA-C Orthopaedic Surgery 03/18/2020, 3:57 PM

## 2020-03-18 NOTE — Discharge Summary (Signed)
Physician Discharge Summary  Patient ID: [name] MRN: [id number] DOB/AGE: 1946/08/13 74 y.o.  Admit date: 03/16/2020 Discharge date: 03/19/2020  Admission Diagnoses:  Status post total knee replacement using cement, left [Z96.652]  Discharge Diagnoses: Patient Active Problem List   Diagnosis Date Noted  . 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 S/P total hip arthroplasty 10/18/2016  . Hypersomnia with sleep apnea 07/20/2015  . Hypersomnia, persistent 07/20/2015  . OSA (obstructive sleep apnea) 07/20/2015  . Lumbar compression fracture ([place]) 12/21/2014  . Morbid obesity ([place]) 03/07/2013  . Obstructive sleep apnea 03/07/2013  . Fibromyalgia 03/07/2013  . Other and unspecified hyperlipidemia 03/07/2013  . Hx of gastritis 03/07/2013  . S/P cholecystectomy 03/07/2013  . Chest pain 01/07/2013    Past Medical History:  Diagnosis Date  . Allergy    SEASONAL-flonase daily as well as Claritin  . Anemia    takes Ferrous Sulfate daily  . Anxiety    takes Celexa every evening  . Arthritis    BACK/KNEE  . Cataract    IMMATURE AND ON LEFT EYE SHE THINKS  . Chronic back pain   . Chronic back pain    L1 FRACTURE  . Depression    takes Wellbutrin daily  . Diabetes mellitus without complication ([place])    type II  . Dyspnea    with exertion  . Fibromyalgia   . GERD (gastroesophageal reflux disease)    takes Omeprazole daily and Zantac at bedtime  . H/O hiatal hernia   . History of colon polyps   . History of shingles   . Hyperlipidemia    takes Pravastatin daily as well as Questran  . Hypertension    takes Diovan daily  . Hypothyroidism   . Insomnia    takes trazodone nightly  . Joint pain   . Muscle spasm    takes Robaxin daily as needed  . Nausea    takes Zofran daily as needed  . Nocturia   . Numbness and tingling    LEGS  . Osteoporosis    takes fosamax  . Sleep apnea    uses cpap  . Thyroid disease    takes Synthroid daily    Transfusion: None.   Consultants (if any):   Discharged Condition: Improved  Hospital Course: [name] is an 74 y.o. female who was admitted 03/16/2020 with a diagnosis of primary osteoarthritis of the left knee and went to the operating room on 03/16/2020 and underwent the above named procedures.    Surgeries: Procedure(s): TOTAL KNEE ARTHROPLASTY on 03/16/2020 Patient tolerated the surgery well. Taken to PACU where she was stabilized and then transferred to the orthopedic floor.  Started on Lovenox 40mg  q 24 hrs. Foot pumps applied bilaterally at 80 mm. Heels elevated on bed with rolled towels. No evidence of DVT. Negative [place]. Physical therapy started on day #1 for gait training and transfer. OT started day #1 for ADL and assisted devices.  Patient's IV was d/c on POD2.   Implants: Left TKA using Signature-guided all-cemented [employer] Vanguard system with a 65 mm PCR femur, a 71 mm tibial tray with a 10 mm anterior stabilized E-poly insert, and a 34 x 8.5 mm all-poly 3-pegged domed patella.  She was given perioperative antibiotics:  Anti-infectives (From admission, onward)   Start     Dose/Rate Route Frequency Ordered Stop   03/16/20 1400  clindamycin (CLEOCIN) IVPB 600 mg     600 mg 100 mL/hr over 30 Minutes  Intravenous Every 6 hours 03/16/20 1144 03/17/20 0426   03/16/20 0615  clindamycin (CLEOCIN) IVPB 900 mg     900 mg 100 mL/hr over 30 Minutes Intravenous On call to O.R. 03/16/20 [id number] 03/16/20 [id number]    .  She was given sequential compression devices, early ambulation, and Lovenox for DVT prophylaxis.  She benefited maximally from the hospital stay and there were no complications.    Recent vital signs:  Vitals:   03/18/20 2344 03/19/20 0752  BP: (!) 116/58 (!) 98/56  Pulse: 87 75  Resp: 16 16  Temp: 98.9 F (37.2 C) 98 F (36.7 C)  SpO2: 91% 94%    Recent laboratory studies:  Lab Results  Component Value Date   HGB 12.6 03/18/2020   HGB 11.4 (L)  03/17/2020   HGB 13.9 03/16/2020   Lab Results  Component Value Date   WBC 9.0 03/18/2020   PLT 153 03/18/2020   Lab Results  Component Value Date   INR 1.04 10/06/2016   Lab Results  Component Value Date   NA 140 03/19/2020   K 4.3 03/19/2020   CL 102 03/19/2020   CO2 30 03/19/2020   BUN 13 03/19/2020   CREATININE 0.69 03/19/2020   GLUCOSE 108 (H) 03/19/2020    Discharge Medications:   Allergies as of 03/19/2020      Reactions   Sulfa Antibiotics Swelling   Face, throat, body   Penicillins Itching   Has patient had a PCN reaction causing immediate rash, facial/tongue/throat swelling, SOB or lightheadedness with hypotension: No Has patient had a PCN reaction causing severe rash involving mucus membranes or skin necrosis: No Has patient had a PCN reaction that required hospitalization No Has patient had a PCN reaction occurring within the last 10 years: No If all of the above answers are "NO", then may proceed with Cephalosporin use.      Medication List    STOP taking these medications   aspirin EC 81 MG tablet   HYDROcodone-acetaminophen 5-325 MG tablet Commonly known as: NORCO/VICODIN   oxyCODONE-acetaminophen 5-325 MG tablet Commonly known as: PERCOCET/ROXICET     TAKE these medications   buPROPion 150 MG 12 hr tablet Commonly known as: WELLBUTRIN SR Take 150 mg by mouth daily.   CALCIUM 600/VITAMIN D PO Take 1 tablet by mouth daily.   citalopram 40 MG tablet Commonly known as: CELEXA Take 40 mg by mouth daily. In the afternoon   enoxaparin 40 MG/0.4ML injection Commonly known as: LOVENOX Inject 0.4 mLs (40 mg total) into the skin daily.   ferrous sulfate 325 (65 FE) MG tablet Take 325 mg by mouth daily with breakfast.   Fish Oil 500 MG Caps Take 500 mg by mouth daily.   fluticasone 50 MCG/ACT nasal spray Commonly known as: FLONASE Place 2 sprays into the nose daily.   ibuprofen 200 MG tablet Commonly known as: ADVIL Take 400 mg by mouth  every 6 (six) hours as needed for moderate pain.   levothyroxine 125 MCG tablet Commonly known as: SYNTHROID Take 125 mcg by mouth daily before breakfast.   loratadine 10 MG tablet Commonly known as: CLARITIN Take 10 mg by mouth daily. In the morning   metFORMIN 500 MG 24 hr tablet Commonly known as: GLUCOPHAGE-XR Take 500 mg by mouth every evening.   omeprazole 40 MG capsule Commonly known as: PRILOSEC Take 1 capsule (40 mg total) by mouth daily before breakfast.   ondansetron 4 MG disintegrating tablet Commonly known as: Zofran ODT Take  1 tablet (4 mg total) by mouth every 8 (eight) hours as needed for nausea or vomiting.   ondansetron 4 MG tablet Commonly known as: ZOFRAN Take 1 tablet (4 mg total) by mouth every 6 (six) hours as needed for nausea.   oxyCODONE 5 MG immediate release tablet Commonly known as: Oxy IR/ROXICODONE Take 1-2 tablets (5-10 mg total) by mouth every 4 (four) hours as needed for moderate pain (pain score 4-6).   potassium chloride SA 20 MEQ tablet Commonly known as: KLOR-CON Take 20 mEq by mouth 2 (two) times daily.   pravastatin 20 MG tablet Commonly known as: PRAVACHOL Take 20 mg by mouth at bedtime.   tiZANidine 2 MG tablet Commonly known as: ZANAFLEX Take 1 tablet (2 mg total) by mouth every 8 (eight) hours as needed for muscle spasms.   traMADol 50 MG tablet Commonly known as: ULTRAM Take 1-2 tablets (50-100 mg total) by mouth every 6 (six) hours as needed for moderate pain.   traZODone 50 MG tablet Commonly known as: DESYREL Take 50 mg by mouth at bedtime.   valsartan-hydrochlorothiazide 80-12.5 MG tablet Commonly known as: DIOVAN-HCT Take 1 tablet by mouth daily.            Durable Medical Equipment  (From admission, onward)         Start     Ordered   03/16/20 1145  DME Bedside commode  Once    Question:  Patient needs a bedside commode to treat with the following condition  Answer:  Status post total knee replacement  using cement, left   03/16/20 1144   03/16/20 1145  DME 3 n 1  Once     03/16/20 1144   03/16/20 1145  DME Walker rolling  Once    Question Answer Comment  Walker: With 5 Inch Wheels   Patient needs a walker to treat with the following condition Status post total knee replacement using cement, left      03/16/20 1144         Diagnostic Studies: DG Knee Left Port  Result Date: 03/16/2020 CLINICAL DATA:  Total left knee replacement. EXAM: PORTABLE LEFT KNEE - 1-2 VIEW COMPARISON:  CT 01/05/2020. FINDINGS: Total left knee replacement.  Hardware intact.  Anatomic alignment. IMPRESSION: Total left knee replacement with anatomic alignment. Electronically Signed   By: [place]   On: 03/16/2020 10:51   Disposition:  Discharge home today with HHPT.  Follow-up Information    Lattie Corns, PA-C Follow up in 14 day(s).   Specialty: Physician Assistant Why: Electa Sniff information: [place] [address] [phone]          Signed: Judson Roch PA-C 03/19/2020, 7:57 AM

## 2020-03-18 NOTE — Progress Notes (Signed)
OT Cancellation Note  Patient Details Name: [name] MRN: [id number] DOB: 01-Sep-1946   Cancelled Treatment:    Reason Eval/Treat Not Completed: Pain limiting ability to participate. Pt unable to participate with OT 2/2 poor pain control. Will re-attempt OT tx at later date/time as pt is able to participate.   Jeni Salles, MPH, MS, OTR/L ascom [phone] 03/18/20, 10:49 AM

## 2020-03-19 LAB — BASIC METABOLIC PANEL
Anion gap: 8 (ref 5–15)
BUN: 13 mg/dL (ref 8–23)
CO2: 30 mmol/L (ref 22–32)
Calcium: 8.7 mg/dL — ABNORMAL LOW (ref 8.9–10.3)
Chloride: 102 mmol/L (ref 98–111)
Creatinine, Ser: 0.69 mg/dL (ref 0.44–1.00)
GFR calc Af Amer: >60 mL/min (ref >60)
GFR calc non Af Amer: >60 mL/min (ref >60)
Glucose, Bld: 108 mg/dL — ABNORMAL HIGH (ref 70–99)
Potassium: 4.3 mmol/L (ref 3.5–5.1)
Sodium: 140 mmol/L (ref 135–145)

## 2020-03-19 LAB — GLUCOSE, CAPILLARY
Glucose-Capillary: 97 mg/dL (ref 70–99)
Glucose-Capillary: 96 mg/dL (ref 70–99)

## 2020-03-19 NOTE — Progress Notes (Signed)
Subjective: 3 Days Post-Op Procedure(s) (LRB): TOTAL KNEE ARTHROPLASTY (Left) Patient reports pain as improved compared to yesterday. Patient is well, and has had no acute complaints or problems Plan is to go home with HHPT. Negative for chest pain and shortness of breath Fever: no Gastrointestinal: Negative for nausea and vomiting  Objective: Vital signs in last 24 hours: Temp:  [98 F (36.7 C)-98.9 F (37.2 C)] 98 F (36.7 C) (03/26 0752) Pulse Rate:  [75-99] 75 (03/26 0752) Resp:  [16-20] 16 (03/26 0752) BP: (98-135)/(56-81) 98/56 (03/26 0752) SpO2:  [91 %-94 %] 94 % (03/26 0752)  Intake/Output from previous day:  Intake/Output Summary (Last 24 hours) at 03/19/2020 0756 Last data filed at 03/19/2020 [id number] Gross per 24 hour  Intake --  Output 1900 ml  Net -1900 ml    Intake/Output this shift: No intake/output data recorded.  Labs: Recent Labs    03/17/20 0435 03/18/20 0438  HGB 11.4* 12.6   Recent Labs    03/17/20 0435 03/18/20 0438  WBC 8.5 9.0  RBC 3.75* 4.07  HCT 34.8* 37.1  PLT 145* 153   Recent Labs    03/18/20 0438 03/19/20 0505  NA 140 140  K 3.7 4.3  CL 105 102  CO2 29 30  BUN 15 13  CREATININE 0.61 0.69  GLUCOSE 106* 108*  CALCIUM 8.7* 8.7*   No results for input(s): LABPT, INR in the last 72 hours.   EXAM General - Patient is Alert, Appropriate and Oriented Extremity - Neurovascular intact Sensation intact distally Dorsiflexion/Plantar flexion intact Compartment soft Dressing/Incision - clean, dry, with the wound VAC in place without drainage. Motor Function - intact, moving foot and toes well on exam.  Able to straight leg raise with mild assistance. Abdomen with mild distention with normal bowel sounds.  Past Medical History:  Diagnosis Date  . Allergy    SEASONAL-flonase daily as well as Claritin  . Anemia    takes Ferrous Sulfate daily  . Anxiety    takes Celexa every evening  . Arthritis    BACK/KNEE  . Cataract     IMMATURE AND ON LEFT EYE SHE THINKS  . Chronic back pain   . Chronic back pain    L1 FRACTURE  . Depression    takes Wellbutrin daily  . Diabetes mellitus without complication ([place])    type II  . Dyspnea    with exertion  . Fibromyalgia   . GERD (gastroesophageal reflux disease)    takes Omeprazole daily and Zantac at bedtime  . H/O hiatal hernia   . History of colon polyps   . History of shingles   . Hyperlipidemia    takes Pravastatin daily as well as Questran  . Hypertension    takes Diovan daily  . Hypothyroidism   . Insomnia    takes trazodone nightly  . Joint pain   . Muscle spasm    takes Robaxin daily as needed  . Nausea    takes Zofran daily as needed  . Nocturia   . Numbness and tingling    LEGS  . Osteoporosis    takes fosamax  . Sleep apnea    uses cpap  . Thyroid disease    takes Synthroid daily    Assessment/Plan: 3 Days Post-Op Procedure(s) (LRB): TOTAL KNEE ARTHROPLASTY (Left) Active Problems:   Status post total knee replacement using cement, left  Estimated body mass index is 44.09 kg/m as calculated from the following:   Height as of this encounter:  4' 11.5" (1.511 m).   Weight as of this encounter: 100.7 kg. Advance diet Up with therapy   Current plan is for discharge home with HHPT. Patient has had a BM. Plan for discharge home today. Will change woundvac to honeycomb dressing prior to discharge.  DVT Prophylaxis - Lovenox, Foot Pumps and TED hose Weight-Bearing as tolerated to left leg  J. Cameron Proud, PA-C Orthopaedic Surgery 03/19/2020, 7:56 AM

## 2020-03-19 NOTE — Progress Notes (Signed)
OT Cancellation Note  Patient Details Name: [name] MRN: [id number] DOB: [birth year]   Cancelled Treatment:    Reason Eval/Treat Not Completed: Pain limiting ability to participate;Other (comment)   Approached patient for OT session this date.  Patient declining stating her pain was too great to participate.  Patient requesting pain medication; nursing notified. Will follow up as able and appropriate.    Oren Binet 03/19/2020, 2:44 PM

## 2020-03-19 NOTE — Care Management Important Message (Signed)
Important Message  Patient Details  Name: [name] MRN: [id number] Date of Birth: 17-Feb-1946   Medicare Important Message Given:  Yes     Juliann Pulse A Myldred Raju 03/19/2020, 10:56 AM

## 2020-03-19 NOTE — Progress Notes (Signed)
Physical Therapy Treatment Patient Details Name: [name] MRN: [id number] DOB: 1946-02-27 Today's Date: 03/19/2020    History of Present Illness Pt is 74 yo female s/p L TKA, WBAT. PMH of anxiety, low back pain, depression, DMII, fibromyalgia, HTN, R THA    PT Comments    Pt initially deferred PT services until she had more pain medication. On the second attempt, pt was agreeable PT. Pt appeared to have pain avoidance and needed encouragement to engage in mobility that provoked pain such as bending her knee and WB through her LLE. Pt ambulated about 40 feet total with a very slow gait pattern. Pt reiterated several times that she felt safe returning home and would be able to safely ambulate from her car to inside her home and get around in her home with a little bit of help from her husband. Pt's husband stated that he would be around 24/7 to help as needed. Pt appeared to only be limited by pain and consistently denied any other symptoms t/o the session. Pt will benefit from HHPT services upon discharge to safely address deficits listed in patient problem list for decreased caregiver assistance and eventual return to PLOF.           Follow Up Recommendations  Home health PT;Supervision for mobility/OOB     Equipment Recommendations  None recommended by PT    Recommendations for Other Services       Precautions / Restrictions Precautions Precautions: Fall;Knee Precaution Booklet Issued: No Restrictions Weight Bearing Restrictions: Yes LLE Weight Bearing: Weight bearing as tolerated    Mobility  Bed Mobility Overal bed mobility: Needs Assistance Bed Mobility: Supine to Sit     Supine to sit: Min assist;HOB elevated     General bed mobility comments: Occasional min A for LLE. Lots of motivation and cueing provided  Transfers Overall transfer level: Needs assistance Equipment used: Rolling walker (2 wheeled) Transfers: Sit to/from Stand Sit to Stand: Min guard;From  elevated surface         General transfer comment: Unable to stand-up from a low surface. From elevated surface, pt was CGA and needed cues for hand placement  Ambulation/Gait Ambulation/Gait assistance: Min guard Gait Distance (Feet): 30 Feet Assistive device: Rolling walker (2 wheeled) Gait Pattern/deviations: Step-to pattern;Decreased step length - right;Decreased step length - left;Decreased weight shift to left Gait velocity: decreased   General Gait Details: Step-to pattern, very effortful and time-consuming. Very short step length   Stairs             Wheelchair Mobility    Modified Rankin (Stroke Patients Only)       Balance Overall balance assessment: Needs assistance Sitting-balance support: No upper extremity supported;Feet unsupported Sitting balance-Leahy Scale: Good       Standing balance-Leahy Scale: Fair Standing balance comment: RW utilized for all standing                            Cognition Arousal/Alertness: Awake/alert Behavior During Therapy: WFL for tasks assessed/performed Overall Cognitive Status: Within Functional Limits for tasks assessed                                        Exercises Total Joint Exercises Ankle Circles/Pumps: AROM;Both;10 reps;Strengthening Quad Sets: AROM;Both;10 reps;Strengthening Gluteal Sets: Strengthening;Both;10 reps Hip ABduction/ADduction: AAROM;Strengthening;Left;10 reps Straight Leg Raises: AAROM;Strengthening;Left;10 reps Long Arc Quad:  AROM;Strengthening;Left;10 reps Knee Flexion: AROM;Strengthening;Left;10 reps Goniometric ROM: L knee AROM: 4-60 deg Marching in Standing: AROM;Strengthening;Both;5 reps Other Exercises Other Exercises: education provided on importance of attempting bed mobility and transfers without physical assistance Other Exercises: Education on car transfers    General Comments        Pertinent Vitals/Pain Pain Assessment: 0-10 Pain Score: 5   Pain Location: L knee Pain Descriptors / Indicators: Aching;Sore Pain Intervention(s): Premedicated before session;Monitored during session(Therapy session was aligned with pain medications schedule)    Home Living                      Prior Function            PT Goals (current goals can now be found in the care plan section) Progress towards PT goals: Progressing toward goals    Frequency    BID      PT Plan Discharge plan needs to be updated(Sup for mobility/OOB)    Co-evaluation              AM-PAC PT "6 Clicks" Mobility   Outcome Measure  Help needed turning from your back to your side while in a flat bed without using bedrails?: A Little Help needed moving from lying on your back to sitting on the side of a flat bed without using bedrails?: A Little Help needed moving to and from a bed to a chair (including a wheelchair)?: A Little Help needed standing up from a chair using your arms (e.g., wheelchair or bedside chair)?: A Little Help needed to walk in hospital room?: A Little Help needed climbing 3-5 steps with a railing? : A Little 6 Click Score: 18    End of Session Equipment Utilized During Treatment: Gait belt Activity Tolerance: Patient limited by pain;Patient tolerated treatment well Patient left: Other (comment)(BSC with nurse tech) Nurse Communication: Mobility status PT Visit Diagnosis: Other abnormalities of gait and mobility (R26.89);Muscle weakness (generalized) (M62.81);Unsteadiness on feet (R26.81);Pain Pain - Right/Left: Left Pain - part of body: Knee     Time: [id number] PT Time Calculation (min) (ACUTE ONLY): 42 min  Charges:                        Annabelle Harman, SPT 03/19/20 12:32 PM

## 2020-03-19 NOTE — TOC Transition Note (Signed)
Transition of Care (TOC) - CM/SW Discharge Note   Patient Details  Name: [name] MRN: [id number] Date of Birth: 05/20/1946  Transition of Care (TOC) CM/SW Contact:  Dupree, Rebecca G, LCSW Phone Number: 03/19/2020, 8:50 AM   Clinical Narrative:   Met with pt who feels ready to go home today. Husband can be there to assist. [facility] set up to see home health. Has all DME. Set for discharge today. No further follow due to DC    Final next level of care: Home w Home Health Services Barriers to Discharge: Barriers Resolved   Patient Goals and CMS Choice Patient states their goals for this hospitalization and ongoing recovery are:: Go home tomorrow CMS Medicare.gov Compare Post Acute Care list provided to:: Patient Choice offered to / list presented to : Patient  Discharge Placement                Patient to be transferred to facility by: Husband via car Name of family member notified: Husband is at bedside Patient and family notified of of transfer: 03/19/20  Discharge Plan and Services In-house Referral: Clinical Social Work   Post Acute Care Choice: Home Health                    HH Arranged: PT HH Agency: [facility] (now [facility]) Date HH Agency Contacted: 03/17/20 Time HH Agency Contacted: 1140 Representative spoke with at HH Agency: teresa  Social Determinants of Health (SDOH) Interventions     Readmission Risk Interventions No flowsheet data found.     

## 2020-03-19 NOTE — Progress Notes (Signed)
Patient discharged home with belongings, hard rx and knee replacement instructions, Transported via w/c to husbands car.

## 2020-04-08 ENCOUNTER — Ambulatory Visit (HOSPITAL_COMMUNITY): Payer: Medicare Other | Attending: Student | Admitting: Physical Therapy

## 2020-04-08 ENCOUNTER — Other Ambulatory Visit: Payer: Self-pay

## 2020-04-08 DIAGNOSIS — G8929 Other chronic pain: Secondary | ICD-10-CM | POA: Insufficient documentation

## 2020-04-08 DIAGNOSIS — R600 Localized edema: Secondary | ICD-10-CM | POA: Diagnosis present

## 2020-04-08 DIAGNOSIS — R262 Difficulty in walking, not elsewhere classified: Secondary | ICD-10-CM | POA: Diagnosis present

## 2020-04-08 DIAGNOSIS — M25562 Pain in left knee: Secondary | ICD-10-CM

## 2020-04-08 DIAGNOSIS — M6281 Muscle weakness (generalized): Secondary | ICD-10-CM | POA: Diagnosis present

## 2020-04-08 NOTE — Therapy (Signed)
[place], [place], [address] Phone: [phone]   Fax:  [phone]  Physical Therapy Evaluation  Patient Details  Name: [name] MRN: [id number] Date of Birth: Feb 17, 1946 Referring Provider (PT): Milagros Evener   Encounter Date: 04/08/2020  PT End of Session - 04/08/20 1313    Visit Number  1    Number of Visits  19    Date for PT Re-Evaluation  06/03/20   POC dates 04/08/20 to 06/03/2020   Authorization Type  [employer] Medicare No DED, NO VL NO Auth, AQ included    Progress Note Due on Visit  10    PT Start Time  1315    PT Stop Time  1355    PT Time Calculation (min)  40 min    Activity Tolerance  Patient tolerated treatment well    Behavior During Therapy  [facility] for tasks assessed/performed       Past Medical History:  Diagnosis Date  . Allergy    SEASONAL-flonase daily as well as Claritin  . Anemia    takes Ferrous Sulfate daily  . Anxiety    takes Celexa every evening  . Arthritis    BACK/KNEE  . Cataract    IMMATURE AND ON LEFT EYE SHE THINKS  . Chronic back pain   . Chronic back pain    L1 FRACTURE  . Depression    takes Wellbutrin daily  . Diabetes mellitus without complication ([place])    type II  . Dyspnea    with exertion  . Fibromyalgia   . GERD (gastroesophageal reflux disease)    takes Omeprazole daily and Zantac at bedtime  . H/O hiatal hernia   . History of colon polyps   . History of shingles   . Hyperlipidemia    takes Pravastatin daily as well as Questran  . Hypertension    takes Diovan daily  . Hypothyroidism   . Insomnia    takes trazodone nightly  . Joint pain   . Muscle spasm    takes Robaxin daily as needed  . Nausea    takes Zofran daily as needed  . Nocturia   . Numbness and tingling    LEGS  . Osteoporosis    takes fosamax  . Sleep apnea    uses cpap  . Thyroid disease    takes Synthroid daily    Past Surgical History:  Procedure Laterality Date  .  ABDOMINAL HYSTERECTOMY    . CARDIAC CATHETERIZATION    . CARPAL TUNNEL RELEASE     right and lt  . CHOLECYSTECTOMY    . COLONOSCOPY    . COLONOSCOPY    . EYE SURGERY Bilateral    Cataract with lens  . FOOT SURGERY Bilateral   . FRACTURE SURGERY     rt hand.  no metal  . FRACTURE SURGERY     lr wrist. no metal  . HAND SURGERY Right 06/1985  . HAND SURGERY Left   . JOINT REPLACEMENT Right    THR  . KNEE ARTHROSCOPY Left   . KNEE ARTHROSCOPY Bilateral   . KYPHOPLASTY N/A 12/21/2014   Procedure: KYPHOPLASTY LUMBAR ONE;  Surgeon: Newman Pies, MD;  Location: [place] [facility];  Service: Neurosurgery;  Laterality: N/A;  L1 Kyphoplasty  . ORIF FINGER FRACTURE  04/11/2012   Procedure: OPEN REDUCTION INTERNAL FIXATION (ORIF) METACARPAL (FINGER) FRACTURE;  Surgeon: Ninetta Lights, MD;  Location: [place];  Service: Orthopedics;  Laterality: Left;  orif left hand 4th metacarpal   . 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 RECTOCELE REPAIR    . SHOULDER ARTHROSCOPY WITH DISTAL CLAVICLE RESECTION Right 05/20/2015   Procedure: SHOULDER ARTHROSCOPY WITH DISTAL CLAVICLE RESECTION DEBRIDEMENT PARTIAL ROTATOR CUFF LABRAL TEAR REMOVAL LOOSE BODIES;  Surgeon: Kathryne Hitch, MD;  Location: [place];  Service: Orthopedics;  Laterality: Right;  . TONSILLECTOMY    . TOTAL HIP ARTHROPLASTY Right 10/18/2016   Procedure: TOTAL HIP ARTHROPLASTY ANTERIOR APPROACH;  Surgeon: Ninetta Lights, MD;  Location: [place];  Service: Orthopedics;  Laterality: Right;  . TOTAL KNEE ARTHROPLASTY Left 03/16/2020   Procedure: TOTAL KNEE ARTHROPLASTY;  Surgeon: Corky Mull, MD;  Location: [facility];  Service: Orthopedics;  Laterality: Left;  . UPPER GASTROINTESTINAL ENDOSCOPY  02/2013  . WRIST SURGERY Left     There were no vitals filed for this visit.   Subjective Assessment - 04/08/20 1322    Subjective  Patient s/p left TKA 03/16/20 after having chronic knee pain for years.  States she had injections in it and they did not help. Reports prior to her surgery she was using a quad cane but currently needs to use a walker. Reports she had to put off her surgery secondary to her husband needing her to take care of him for his healthcare needs but now he is better and can help her recover. She likes to play cards for fun. She has been doing her exercises three times a day.    Pertinent History  HTN, arthritis, history of left knee scope    Limitations  Sitting;Lifting;Standing;Walking    Patient Stated Goals  to be able to walk better    Currently in Pain?  Yes    Pain Score  6     Pain Location  Knee    Pain Orientation  Left;Anterior    Pain Descriptors / Indicators  Tightness;Nagging;Throbbing    Pain Type  Surgical pain    Pain Radiating Towards  around the knee    Pain Onset  1 to 4 weeks ago    Aggravating Factors   movement and walking    Pain Relieving Factors  medication, ice.         [facility] PT Assessment - 04/08/20 0001      Assessment   Medical Diagnosis  L TKA    Referring Provider (PT)  John Poggi    Onset Date/Surgical Date  03/16/20    Next MD Visit  04/28/20    Prior Therapy  yes for hip      Precautions   Precautions  None      Restrictions   Weight Bearing Restrictions  No      Balance Screen   Has the patient fallen in the past 6 months  No      [place] residence    Available Help at Discharge  Family    Type of [place] entrance    Home Layout  One level    [place] - 2 wheels;Cane - single point;Grab bars - tub/shower;Grab bars - toilet;Bedside commode      Prior Function   Level of Independence  Independent      Cognition   Overall Cognitive Status  Within Functional Limits for tasks assessed      Observation/Other Assessments   Observations  steri strips  in place, incision healing well, minor redness around incision site, scabs present, no  erythema noted; bruise along medial knee    Focus on Therapeutic Outcomes (FOTO)   51% limited       Observation/Other Assessments-Edema    Edema  Circumferential      Circumferential Edema   Circumferential - Right  R joint line 50.4cm     Circumferential - Left   L joint line 54.5cm      ROM / Strength   AROM / PROM / Strength  AROM      AROM   AROM Assessment Site  Knee    Right/Left Knee  Right;Left    Right Knee Extension  0    Right Knee Flexion  116   long sitting   Left Knee Extension  5    Left Knee Flexion  70   longsitting, 78 sitting     Palpation   Patella mobility  hypomobility    Palpation comment  tenderness along medial side of left knee      Special Tests   Other special tests  neg homan's sign B      Transfers   Transfers  Sit to Stand;Stand to Sit    Sit to Stand  With upper extremity assist    Stand to Sit  With upper extremity assist      Ambulation/Gait   Ambulation/Gait  Yes    Ambulation/Gait Assistance  6: Modified independent (Device/Increase time)    Ambulation Distance (Feet)  154 Feet    Assistive device  Rolling walker    Gait Pattern  Decreased hip/knee flexion - left;Decreased dorsiflexion - left;Decreased arm swing - left;Step-through pattern;Decreased arm swing - right;Trunk flexed    Ambulation Surface  Level;Indoor    Gait velocity  decreased                Objective measurements completed on examination: See above findings.      [facility] Adult PT Treatment/Exercise - 04/08/20 0001      Ambulation/Gait   Gait Comments  2MW      Exercises   Exercises  Knee/Hip      Knee/Hip Exercises: Seated   Other Seated Knee/Hip Exercises  reviewed HEP exercises knee flexion with over pressure, ,LAQs, knee bends             PT Education - 04/08/20 1359    Education Details  on current condition, post operative care and rehab protocol    Person(s) Educated  Patient    Methods  Explanation    Comprehension  Verbalized  understanding       PT Short Term Goals - 04/08/20 1403      PT SHORT TERM GOAL #1   Title  Patient will be independent in self management strategies to improve quality of life and functional outcomes.    Time  4    Period  Weeks    Status  New    Target Date  05/06/20      PT SHORT TERM GOAL #2   Title  Patient will report at least 25% improvement in overall symptoms and/or functional ability.    Time  4    Period  Weeks    Status  New    Target Date  05/06/20      PT SHORT TERM GOAL #3   Title  Patient will demonstrate 0-100 degrees of left knee motion.    Time  4    Period  Weeks  Status  New    Target Date  05/06/20        PT Long Term Goals - 04/08/20 1403      PT LONG TERM GOAL #1   Title  Patient will be able to ambulate at least 226 feet wtih least restrictive device possible in 2 minutes to demonstrate improved functional mobility.    Time  8    Period  Weeks    Status  New    Target Date  06/03/20      PT LONG TERM GOAL #2   Title  Patient will be able to ascend and descend stairs with alternating gait pattern with use of arms as needed to improve community mobility.    Time  8    Period  Weeks    Status  New    Target Date  06/03/20      PT LONG TERM GOAL #3   Title  Patient will report at least 50% improvement in overall symptoms and/or functional ability    Time  8    Period  Weeks    Status  New    Target Date  06/03/20             Plan - 04/08/20 1400    Clinical Impression Statement  Patient presents to therapy after L TKA. She is doing well and educated in post operative rehab. Reviewed home program and answered all questions about post surgical care. Primary limitations noted with flexion and walking today. Patient would greatly benefit from skilled physical therapy to return patient to optimal function.    Personal Factors and Comorbidities  Comorbidity 1;Comorbidity 2;Age;Fitness    Comorbidities  left knee scope prior, DB, HTN     Examination-Activity Limitations  Bathing;Bed Mobility;Bend;Caring for Others;Carry;Dressing;Stairs;Squat;Sleep;Sit    Stability/Clinical Decision Making  Stable/Uncomplicated    Clinical Decision Making  Low    Rehab Potential  Good    PT Frequency  Other (comment)   3x/week for 3 weeks then 2x/week for 5 weeks   PT Duration  8 weeks    PT Treatment/Interventions  ADLs/Self Care Home Management;Iontophoresis 4mg /ml Dexamethasone;Gait training;Stair training;Neuromuscular re-education;Dry needling;Joint Manipulations;Scar mobilization;Passive range of motion;Manual techniques;Functional mobility training;Therapeutic activities;Therapeutic exercise;Balance training;DME Instruction;Electrical Stimulation;Cryotherapy;Biofeedback;Aquatic Therapy;Moist Heat;Traction;Ultrasound;Patient/family education    PT Next Visit Plan  knee ROM (flexion), Swelling, walking and LE strengthing    PT Home Exercise Plan  reviewed hospital exercises.    Consulted and Agree with Plan of Care  Patient       Patient will benefit from skilled therapeutic intervention in order to improve the following deficits and impairments:  Abnormal gait, Decreased range of motion, Difficulty walking, Decreased endurance, Decreased activity tolerance, Decreased skin integrity, Pain, Hypomobility, Decreased scar mobility, Decreased knowledge of use of DME, Decreased balance, Decreased mobility, Decreased strength, Increased edema  Visit Diagnosis: Chronic pain of left knee  Difficulty in walking, not elsewhere classified  Muscle weakness (generalized)  Localized edema     Problem List Patient Active Problem List   Diagnosis Date Noted  . 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 S/P total hip arthroplasty 10/18/2016  . Hypersomnia with sleep apnea 07/20/2015  . Hypersomnia, persistent 07/20/2015  . OSA (obstructive sleep apnea) 07/20/2015  . Lumbar compression fracture ([place]) 12/21/2014  .  Morbid obesity ([place]) 03/07/2013  . Obstructive sleep apnea 03/07/2013  . Fibromyalgia 03/07/2013  . Other and unspecified hyperlipidemia 03/07/2013  . Hx of gastritis 03/07/2013  . S/P cholecystectomy 03/07/2013  .  Chest pain 01/07/2013    5:20 PM, 04/08/20 Jerene Pitch, DPT Physical Therapy with [facility]  [phone] office  [place] [address] [place], [place], [address] Phone: [phone]   Fax:  [phone]  Name: [name] MRN: [id number] Date of Birth: [birth year]

## 2020-04-08 NOTE — Addendum Note (Signed)
Addended by: Jerene Pitch R on: 04/08/2020 05:22 PM   Modules accepted: Orders

## 2020-04-09 ENCOUNTER — Encounter (HOSPITAL_COMMUNITY): Payer: Self-pay

## 2020-04-09 ENCOUNTER — Ambulatory Visit (HOSPITAL_COMMUNITY): Payer: Medicare Other

## 2020-04-09 DIAGNOSIS — M25562 Pain in left knee: Secondary | ICD-10-CM | POA: Diagnosis not present

## 2020-04-09 DIAGNOSIS — M6281 Muscle weakness (generalized): Secondary | ICD-10-CM

## 2020-04-09 DIAGNOSIS — R262 Difficulty in walking, not elsewhere classified: Secondary | ICD-10-CM

## 2020-04-09 DIAGNOSIS — G8929 Other chronic pain: Secondary | ICD-10-CM

## 2020-04-09 DIAGNOSIS — R600 Localized edema: Secondary | ICD-10-CM

## 2020-04-09 NOTE — Therapy (Signed)
[place], [place], [address] Phone: [phone]   Fax:  [phone]  Physical Therapy Treatment  Patient Details  Name: [name] MRN: [id number] Date of Birth: Oct 10, 1946 Referring Provider (PT): Milagros Evener   Encounter Date: 04/09/2020  PT End of Session - 04/09/20 1323    Visit Number  2    Number of Visits  19    Date for PT Re-Evaluation  06/03/20   POC 04/08/20-->06/03/20   Authorization Type  UHC Medicare No DED, NO VL NO Auth, AQ included    Progress Note Due on Visit  10    PT Start Time  1319   5' on bike, not included wiht charges   PT Stop Time  1402    PT Time Calculation (min)  43 min    Activity Tolerance  Patient tolerated treatment well    Behavior During Therapy  [facility] for tasks assessed/performed       Past Medical History:  Diagnosis Date  . Allergy    SEASONAL-flonase daily as well as Claritin  . Anemia    takes Ferrous Sulfate daily  . Anxiety    takes Celexa every evening  . Arthritis    BACK/KNEE  . Cataract    IMMATURE AND ON LEFT EYE SHE THINKS  . Chronic back pain   . Chronic back pain    L1 FRACTURE  . Depression    takes Wellbutrin daily  . Diabetes mellitus without complication ([place])    type II  . Dyspnea    with exertion  . Fibromyalgia   . GERD (gastroesophageal reflux disease)    takes Omeprazole daily and Zantac at bedtime  . H/O hiatal hernia   . History of colon polyps   . History of shingles   . Hyperlipidemia    takes Pravastatin daily as well as Questran  . Hypertension    takes Diovan daily  . Hypothyroidism   . Insomnia    takes trazodone nightly  . Joint pain   . Muscle spasm    takes Robaxin daily as needed  . Nausea    takes Zofran daily as needed  . Nocturia   . Numbness and tingling    LEGS  . Osteoporosis    takes fosamax  . Sleep apnea    uses cpap  . Thyroid disease    takes Synthroid daily    Past Surgical History:   Procedure Laterality Date  . ABDOMINAL HYSTERECTOMY    . CARDIAC CATHETERIZATION    . CARPAL TUNNEL RELEASE     right and lt  . CHOLECYSTECTOMY    . COLONOSCOPY    . COLONOSCOPY    . EYE SURGERY Bilateral    Cataract with lens  . FOOT SURGERY Bilateral   . FRACTURE SURGERY     rt hand.  no metal  . FRACTURE SURGERY     lr wrist. no metal  . HAND SURGERY Right 06/1985  . HAND SURGERY Left   . JOINT REPLACEMENT Right    THR  . KNEE ARTHROSCOPY Left   . KNEE ARTHROSCOPY Bilateral   . KYPHOPLASTY N/A 12/21/2014   Procedure: KYPHOPLASTY LUMBAR ONE;  Surgeon: Newman Pies, MD;  Location: [facility];  Service: Neurosurgery;  Laterality: N/A;  L1 Kyphoplasty  . ORIF FINGER FRACTURE  04/11/2012   Procedure: OPEN REDUCTION INTERNAL FIXATION (ORIF) METACARPAL (FINGER) FRACTURE;  Surgeon: Ninetta Lights, MD;  Location: [facility];  Service: Orthopedics;  Laterality: Left;  orif left hand 4th metacarpal   . 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 RECTOCELE REPAIR    . SHOULDER ARTHROSCOPY WITH DISTAL CLAVICLE RESECTION Right 05/20/2015   Procedure: SHOULDER ARTHROSCOPY WITH DISTAL CLAVICLE RESECTION DEBRIDEMENT PARTIAL ROTATOR CUFF LABRAL TEAR REMOVAL LOOSE BODIES;  Surgeon: Kathryne Hitch, MD;  Location: [place];  Service: Orthopedics;  Laterality: Right;  . TONSILLECTOMY    . TOTAL HIP ARTHROPLASTY Right 10/18/2016   Procedure: TOTAL HIP ARTHROPLASTY ANTERIOR APPROACH;  Surgeon: Ninetta Lights, MD;  Location: [facility];  Service: Orthopedics;  Laterality: Right;  . TOTAL KNEE ARTHROPLASTY Left 03/16/2020   Procedure: TOTAL KNEE ARTHROPLASTY;  Surgeon: Corky Mull, MD;  Location: [facility];  Service: Orthopedics;  Laterality: Left;  . UPPER GASTROINTESTINAL ENDOSCOPY  02/2013  . WRIST SURGERY Left     There were no vitals filed for this visit.  Subjective Assessment - 04/09/20 1321    Subjective  Pt stated she has a spot on her knee wiht  nagging pain scale 4/10.  Reports she has been completing HEP daily.    Pertinent History  HTN, arthritis, history of left knee scope    Patient Stated Goals  to be able to walk better    Currently in Pain?  Yes    Pain Score  4     Pain Location  Knee    Pain Orientation  Left;Anterior    Pain Descriptors / Indicators  Tightness;Nagging    Pain Type  Surgical pain    Pain Radiating Towards  around the knee    Pain Onset  1 to 4 weeks ago    Pain Frequency  Constant    Aggravating Factors   movement and walking    Pain Relieving Factors  medication and ice         [facility] PT Assessment - 04/09/20 0001      Assessment   Medical Diagnosis  L TKA    Referring Provider (PT)  John Poggi    Onset Date/Surgical Date  03/16/20    Next MD Visit  04/26/20    Prior Therapy  yes for hip      Precautions   Precautions  None                   [facility] Adult PT Treatment/Exercise - 04/09/20 0001      Exercises   Exercises  Knee/Hip      Knee/Hip Exercises: Stretches   Knee: Self-Stretch to increase Flexion  5 reps;10 seconds    Knee: Self-Stretch Limitations  knee drive on 8in step      Knee/Hip Exercises: Aerobic   Stationary Bike  4' rocking seat 7      Knee/Hip Exercises: Standing   Gait Training  265ft with RW, cueing for heel strike and equal stride length, no LOB episodes      Knee/Hip Exercises: Seated   Heel Slides  Left;5 reps      Knee/Hip Exercises: Supine   Quad Sets  10 reps    Quad Sets Limitations  towel under knee 5" holds    Heel Slides  Left;10 reps    Heel Slides Limitations  5" holds    Knee Extension  AROM    Knee Extension Limitations  7    Knee Flexion  AROM    Knee Flexion Limitations  73      Manual Therapy  Manual Therapy  Edema management    Manual therapy comments  Manual complete separate than rest of tx    Edema Management  Retrograde massage for edema control with LE elevated             PT Education - 04/09/20 1332     Education Details  Reviewed goals, assured compliance with current HEP (HHPT).  RICE technqiues for pain control and edema control    Person(s) Educated  Patient    Methods  Explanation    Comprehension  Verbalized understanding       PT Short Term Goals - 04/08/20 1403      PT SHORT TERM GOAL #1   Title  Patient will be independent in self management strategies to improve quality of life and functional outcomes.    Time  4    Period  Weeks    Status  New    Target Date  05/06/20      PT SHORT TERM GOAL #2   Title  Patient will report at least 25% improvement in overall symptoms and/or functional ability.    Time  4    Period  Weeks    Status  New    Target Date  05/06/20      PT SHORT TERM GOAL #3   Title  Patient will demonstrate 0-100 degrees of left knee motion.    Time  4    Period  Weeks    Status  New    Target Date  05/06/20        PT Long Term Goals - 04/08/20 1403      PT LONG TERM GOAL #1   Title  Patient will be able to ambulate at least 226 feet wtih least restrictive device possible in 2 minutes to demonstrate improved functional mobility.    Time  8    Period  Weeks    Status  New    Target Date  06/03/20      PT LONG TERM GOAL #2   Title  Patient will be able to ascend and descend stairs with alternating gait pattern with use of arms as needed to improve community mobility.    Time  8    Period  Weeks    Status  New    Target Date  06/03/20      PT LONG TERM GOAL #3   Title  Patient will report at least 50% improvement in overall symptoms and/or functional ability    Time  8    Period  Weeks    Status  New    Target Date  06/03/20            Plan - 04/09/20 1343    Clinical Impression Statement  Reviewed goals and compliance iwht current HEP (HHPT).  Gait training complete wiht RW, good mechanics with min cueing to improve heel strike and equal stride length.  [place] foucs on with knee mobility, added staning knee drives and rocking on  bike (seat 7).  EOS with manual retrograde massage for edema control.  No reports of pain through session, was limited by [place].    Personal Factors and Comorbidities  Comorbidity 1;Comorbidity 2;Age;Fitness    Comorbidities  left knee scope prior, DB, HTN    Examination-Activity Limitations  Bathing;Bed Mobility;Bend;Caring for Others;Carry;Dressing;Stairs;Squat;Sleep;Sit    Stability/Clinical Decision Making  Stable/Uncomplicated    Clinical Decision Making  Low    Rehab Potential  Good    PT Frequency  --  3x/week for 3 weeks then 2x/week for 5 weeks   PT Duration  8 weeks    PT Treatment/Interventions  ADLs/Self Care Home Management;Iontophoresis 4mg /ml Dexamethasone;Gait training;Stair training;Neuromuscular re-education;Dry needling;Joint Manipulations;Scar mobilization;Passive range of motion;Manual techniques;Functional mobility training;Therapeutic activities;Therapeutic exercise;Balance training;DME Instruction;Electrical Stimulation;Cryotherapy;Biofeedback;Aquatic Therapy;Moist Heat;Traction;Ultrasound;Patient/family education    PT Next Visit Plan  knee ROM (flexion), Swelling, walking and LE strengthing    PT Home Exercise Plan  reviewed hospital exercises.       Patient will benefit from skilled therapeutic intervention in order to improve the following deficits and impairments:  Abnormal gait, Decreased range of motion, Difficulty walking, Decreased endurance, Decreased activity tolerance, Decreased skin integrity, Pain, Hypomobility, Decreased scar mobility, Decreased knowledge of use of DME, Decreased balance, Decreased mobility, Decreased strength, Increased edema  Visit Diagnosis: Muscle weakness (generalized)  Localized edema  Difficulty in walking, not elsewhere classified  Chronic pain of left knee     Problem List Patient Active Problem List   Diagnosis Date Noted  . 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 S/P total hip  arthroplasty 10/18/2016  . Hypersomnia with sleep apnea 07/20/2015  . Hypersomnia, persistent 07/20/2015  . OSA (obstructive sleep apnea) 07/20/2015  . Lumbar compression fracture ([place]) 12/21/2014  . Morbid obesity ([place]) 03/07/2013  . Obstructive sleep apnea 03/07/2013  . Fibromyalgia 03/07/2013  . Other and unspecified hyperlipidemia 03/07/2013  . Hx of gastritis 03/07/2013  . S/P cholecystectomy 03/07/2013  . Chest pain 01/07/2013   Ihor Austin, LPTA/CLT; CBIS [phone]  Aldona Lento 04/09/2020, 4:30 PM  [place], [place], [address] Phone: [phone]   Fax:  [phone]  Name: [name] MRN: [id number] Date of Birth: 09-08-1946

## 2020-04-12 ENCOUNTER — Ambulatory Visit (HOSPITAL_COMMUNITY): Payer: Medicare Other | Admitting: Physical Therapy

## 2020-04-12 ENCOUNTER — Other Ambulatory Visit: Payer: Self-pay

## 2020-04-12 DIAGNOSIS — G8929 Other chronic pain: Secondary | ICD-10-CM

## 2020-04-12 DIAGNOSIS — M25562 Pain in left knee: Secondary | ICD-10-CM | POA: Diagnosis not present

## 2020-04-12 DIAGNOSIS — R600 Localized edema: Secondary | ICD-10-CM

## 2020-04-12 DIAGNOSIS — M6281 Muscle weakness (generalized): Secondary | ICD-10-CM

## 2020-04-12 DIAGNOSIS — R262 Difficulty in walking, not elsewhere classified: Secondary | ICD-10-CM

## 2020-04-12 NOTE — Therapy (Signed)
[place], [place], [address] Phone: [phone]   Fax:  [phone]  Physical Therapy Treatment  Patient Details  Name: [name] MRN: [id number] Date of Birth: [birth year] Referring Provider (PT): Milagros Evener   Encounter Date: 04/12/2020  PT End of Session - 04/12/20 0923    Visit Number  3    Number of Visits  19    Date for PT Re-Evaluation  06/03/20   POC 04/08/20-->06/03/20   Authorization Type  UHC Medicare No DED, NO VL NO Auth, AQ included    Progress Note Due on Visit  10    PT Start Time  0835    PT Stop Time  0920    PT Time Calculation (min)  45 min    Activity Tolerance  Patient tolerated treatment well    Behavior During Therapy  [facility] for tasks assessed/performed       Past Medical History:  Diagnosis Date  . Allergy    SEASONAL-flonase daily as well as Claritin  . Anemia    takes Ferrous Sulfate daily  . Anxiety    takes Celexa every evening  . Arthritis    BACK/KNEE  . Cataract    IMMATURE AND ON LEFT EYE SHE THINKS  . Chronic back pain   . Chronic back pain    L1 FRACTURE  . Depression    takes Wellbutrin daily  . Diabetes mellitus without complication ([place])    type II  . Dyspnea    with exertion  . Fibromyalgia   . GERD (gastroesophageal reflux disease)    takes Omeprazole daily and Zantac at bedtime  . H/O hiatal hernia   . History of colon polyps   . History of shingles   . Hyperlipidemia    takes Pravastatin daily as well as Questran  . Hypertension    takes Diovan daily  . Hypothyroidism   . Insomnia    takes trazodone nightly  . Joint pain   . Muscle spasm    takes Robaxin daily as needed  . Nausea    takes Zofran daily as needed  . Nocturia   . Numbness and tingling    LEGS  . Osteoporosis    takes fosamax  . Sleep apnea    uses cpap  . Thyroid disease    takes Synthroid daily    Past Surgical History:  Procedure Laterality Date  . ABDOMINAL  HYSTERECTOMY    . CARDIAC CATHETERIZATION    . CARPAL TUNNEL RELEASE     right and lt  . CHOLECYSTECTOMY    . COLONOSCOPY    . COLONOSCOPY    . EYE SURGERY Bilateral    Cataract with lens  . FOOT SURGERY Bilateral   . FRACTURE SURGERY     rt hand.  no metal  . FRACTURE SURGERY     lr wrist. no metal  . HAND SURGERY Right 06/1985  . HAND SURGERY Left   . JOINT REPLACEMENT Right    THR  . KNEE ARTHROSCOPY Left   . KNEE ARTHROSCOPY Bilateral   . KYPHOPLASTY N/A 12/21/2014   Procedure: KYPHOPLASTY LUMBAR ONE;  Surgeon: Newman Pies, MD;  Location: [facility];  Service: Neurosurgery;  Laterality: N/A;  L1 Kyphoplasty  . ORIF FINGER FRACTURE  04/11/2012   Procedure: OPEN REDUCTION INTERNAL FIXATION (ORIF) METACARPAL (FINGER) FRACTURE;  Surgeon: Ninetta Lights, MD;  Location: [facility];  Service: Orthopedics;  Laterality: Left;  orif left hand 4th metacarpal   . 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 RECTOCELE REPAIR    . SHOULDER ARTHROSCOPY WITH DISTAL CLAVICLE RESECTION Right 05/20/2015   Procedure: SHOULDER ARTHROSCOPY WITH DISTAL CLAVICLE RESECTION DEBRIDEMENT PARTIAL ROTATOR CUFF LABRAL TEAR REMOVAL LOOSE BODIES;  Surgeon: Kathryne Hitch, MD;  Location: [facility];  Service: Orthopedics;  Laterality: Right;  . TONSILLECTOMY    . TOTAL HIP ARTHROPLASTY Right 10/18/2016   Procedure: TOTAL HIP ARTHROPLASTY ANTERIOR APPROACH;  Surgeon: Ninetta Lights, MD;  Location: [facility];  Service: Orthopedics;  Laterality: Right;  . TOTAL KNEE ARTHROPLASTY Left 03/16/2020   Procedure: TOTAL KNEE ARTHROPLASTY;  Surgeon: Corky Mull, MD;  Location: [facility];  Service: Orthopedics;  Laterality: Left;  . UPPER GASTROINTESTINAL ENDOSCOPY  02/2013  . WRIST SURGERY Left     There were no vitals filed for this visit.  Subjective Assessment - 04/12/20 0901    Subjective  pt states her pain is 6/10 in her Lt knee, nagging pain.  States she's been moving it  alot and feels like she's doing too much.    Currently in Pain?  Yes    Pain Score  6     Pain Location  Knee    Pain Orientation  Left    Pain Descriptors / Indicators  Aching;Tightness                       [facility] Adult PT Treatment/Exercise - 04/12/20 0001      Knee/Hip Exercises: Stretches   Knee: Self-Stretch to increase Flexion  10 seconds    Knee: Self-Stretch Limitations  10 reps onto 12" step      Knee/Hip Exercises: Aerobic   Stationary Bike  4' rocking seat 7      Knee/Hip Exercises: Standing   Heel Raises  Both;10 reps    Heel Raises Limitations  toeraises 10 reps      Knee/Hip Exercises: Supine   Quad Sets  10 reps    Short Arc Quad Sets  10 reps;Left    Heel Slides  Left;10 reps    Heel Slides Limitations  5" holds    Knee Extension  AROM    Knee Extension Limitations  7    Knee Flexion  AROM    Knee Flexion Limitations  77      Manual Therapy   Manual Therapy  Edema management;Soft tissue mobilization    Manual therapy comments  Manual complete separate than rest of tx    Edema Management  Retrograde massage for edema control with LE elevated    Soft tissue mobilization  to scar tissue, adhesed tissue to perimeter of knee               PT Short Term Goals - 04/08/20 1403      PT SHORT TERM GOAL #1   Title  Patient will be independent in self management strategies to improve quality of life and functional outcomes.    Time  4    Period  Weeks    Status  New    Target Date  05/06/20      PT SHORT TERM GOAL #2   Title  Patient will report at least 25% improvement in overall symptoms and/or functional ability.    Time  4    Period  Weeks    Status  New    Target Date  05/06/20  PT SHORT TERM GOAL #3   Title  Patient will demonstrate 0-100 degrees of left knee motion.    Time  4    Period  Weeks    Status  New    Target Date  05/06/20        PT Long Term Goals - 04/08/20 1403      PT LONG TERM GOAL #1   Title   Patient will be able to ambulate at least 226 feet wtih least restrictive device possible in 2 minutes to demonstrate improved functional mobility.    Time  8    Period  Weeks    Status  New    Target Date  06/03/20      PT LONG TERM GOAL #2   Title  Patient will be able to ascend and descend stairs with alternating gait pattern with use of arms as needed to improve community mobility.    Time  8    Period  Weeks    Status  New    Target Date  06/03/20      PT LONG TERM GOAL #3   Title  Patient will report at least 50% improvement in overall symptoms and/or functional ability    Time  8    Period  Weeks    Status  New    Target Date  06/03/20            Plan - 04/12/20 0924    Clinical Impression Statement  continued with established POC with primary focus on ROM and reducing pain.  Began with bike and stretches for ROM.  Added standing heel and toe raises and completed mat AROM exercises.  Improved flexion to 77 degrees today, extension unchanged at 7 degrees from neutral. Manual completed at EOS to reduce edema, scar adhesions and pain.  Overall reduction of pain to half when completed session.  encouraged to continue AROM HEP to improve ROM and reduce stiffness in knee.    Personal Factors and Comorbidities  Comorbidity 1;Comorbidity 2;Age;Fitness    Comorbidities  left knee scope prior, DB, HTN    Examination-Activity Limitations  Bathing;Bed Mobility;Bend;Caring for Others;Carry;Dressing;Stairs;Squat;Sleep;Sit    Stability/Clinical Decision Making  Stable/Uncomplicated    Rehab Potential  Good    PT Frequency  --   3x/week for 3 weeks then 2x/week for 5 weeks   PT Duration  8 weeks    PT Treatment/Interventions  ADLs/Self Care Home Management;Iontophoresis 4mg /ml Dexamethasone;Gait training;Stair training;Neuromuscular re-education;Dry needling;Joint Manipulations;Scar mobilization;Passive range of motion;Manual techniques;Functional mobility training;Therapeutic  activities;Therapeutic exercise;Balance training;DME Instruction;Electrical Stimulation;Cryotherapy;Biofeedback;Aquatic Therapy;Moist Heat;Traction;Ultrasound;Patient/family education    PT Next Visit Plan  knee ROM (flexion), Swelling, walking and LE strengthing    PT Home Exercise Plan  reviewed hospital exercises.       Patient will benefit from skilled therapeutic intervention in order to improve the following deficits and impairments:  Abnormal gait, Decreased range of motion, Difficulty walking, Decreased endurance, Decreased activity tolerance, Decreased skin integrity, Pain, Hypomobility, Decreased scar mobility, Decreased knowledge of use of DME, Decreased balance, Decreased mobility, Decreased strength, Increased edema  Visit Diagnosis: Muscle weakness (generalized)  Localized edema  Difficulty in walking, not elsewhere classified  Chronic pain of left knee     Problem List Patient Active Problem List   Diagnosis Date Noted  . 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 S/P total hip arthroplasty 10/18/2016  . Hypersomnia with sleep apnea 07/20/2015  . Hypersomnia, persistent 07/20/2015  . OSA (obstructive sleep apnea)  07/20/2015  . Lumbar compression fracture ([place]) 12/21/2014  . Morbid obesity ([place]) 03/07/2013  . Obstructive sleep apnea 03/07/2013  . Fibromyalgia 03/07/2013  . Other and unspecified hyperlipidemia 03/07/2013  . Hx of gastritis 03/07/2013  . S/P cholecystectomy 03/07/2013  . Chest pain 01/07/2013   Teena Irani, PTA/CLT [phone]  Teena Irani 04/12/2020, 9:27 AM  [place], [place], [address] Phone: [phone]   Fax:  [phone]  Name: NIDYA DOBBE MRN: [id number] Date of Birth: [birth year]

## 2020-04-14 ENCOUNTER — Ambulatory Visit (HOSPITAL_COMMUNITY): Payer: Medicare Other

## 2020-04-14 ENCOUNTER — Other Ambulatory Visit: Payer: Self-pay

## 2020-04-14 ENCOUNTER — Encounter (HOSPITAL_COMMUNITY): Payer: Self-pay

## 2020-04-14 DIAGNOSIS — R600 Localized edema: Secondary | ICD-10-CM

## 2020-04-14 DIAGNOSIS — M6281 Muscle weakness (generalized): Secondary | ICD-10-CM

## 2020-04-14 DIAGNOSIS — G8929 Other chronic pain: Secondary | ICD-10-CM

## 2020-04-14 DIAGNOSIS — M25562 Pain in left knee: Secondary | ICD-10-CM

## 2020-04-14 DIAGNOSIS — R262 Difficulty in walking, not elsewhere classified: Secondary | ICD-10-CM

## 2020-04-14 NOTE — Therapy (Signed)
[place], [place], [address] Phone: [phone]   Fax:  [phone]  Physical Therapy Treatment  Patient Details  Name: [name] MRN: [id number] Date of Birth: 14-Jul-1946 Referring Provider (PT): Milagros Evener   Encounter Date: 04/14/2020  PT End of Session - 04/14/20 1017    Visit Number  4    Number of Visits  19    Date for PT Re-Evaluation  06/03/20   POC 4/15-->06/03/20   Authorization Type  [employer]HC Medicare No DED, NO VL NO Auth, AQ included    Progress Note Due on Visit  10    PT Start Time  1000   4' on bike, not included with charges   PT Stop Time  1045    PT Time Calculation (min)  45 min    Activity Tolerance  Patient tolerated treatment well    Behavior During Therapy  [facility] for tasks assessed/performed       Past Medical History:  Diagnosis Date  . Allergy    SEASONAL-flonase daily as well as Claritin  . Anemia    takes Ferrous Sulfate daily  . Anxiety    takes Celexa every evening  . Arthritis    BACK/KNEE  . Cataract    IMMATURE AND ON LEFT EYE SHE THINKS  . Chronic back pain   . Chronic back pain    L1 FRACTURE  . Depression    takes Wellbutrin daily  . Diabetes mellitus without complication ([place])    type II  . Dyspnea    with exertion  . Fibromyalgia   . GERD (gastroesophageal reflux disease)    takes Omeprazole daily and Zantac at bedtime  . H/O hiatal hernia   . History of colon polyps   . History of shingles   . Hyperlipidemia    takes Pravastatin daily as well as Questran  . Hypertension    takes Diovan daily  . Hypothyroidism   . Insomnia    takes trazodone nightly  . Joint pain   . Muscle spasm    takes Robaxin daily as needed  . Nausea    takes Zofran daily as needed  . Nocturia   . Numbness and tingling    LEGS  . Osteoporosis    takes fosamax  . Sleep apnea    uses cpap  . Thyroid disease    takes Synthroid daily    Past Surgical History:  Procedure  Laterality Date  . ABDOMINAL HYSTERECTOMY    . CARDIAC CATHETERIZATION    . CARPAL TUNNEL RELEASE     right and lt  . CHOLECYSTECTOMY    . COLONOSCOPY    . COLONOSCOPY    . EYE SURGERY Bilateral    Cataract with lens  . FOOT SURGERY Bilateral   . FRACTURE SURGERY     rt hand.  no metal  . FRACTURE SURGERY     lr wrist. no metal  . HAND SURGERY Right 06/1985  . HAND SURGERY Left   . JOINT REPLACEMENT Right    THR  . KNEE ARTHROSCOPY Left   . KNEE ARTHROSCOPY Bilateral   . KYPHOPLASTY N/A 12/21/2014   Procedure: KYPHOPLASTY LUMBAR ONE;  Surgeon: Newman Pies, MD;  Location: [facility];  Service: Neurosurgery;  Laterality: N/A;  L1 Kyphoplasty  . ORIF FINGER FRACTURE  04/11/2012   Procedure: OPEN REDUCTION INTERNAL FIXATION (ORIF) METACARPAL (FINGER) FRACTURE;  Surgeon: Ninetta Lights, MD;  Location: [facility];  Service: Orthopedics;  Laterality: Left;  orif left hand 4th metacarpal   . 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 RECTOCELE REPAIR    . SHOULDER ARTHROSCOPY WITH DISTAL CLAVICLE RESECTION Right 05/20/2015   Procedure: SHOULDER ARTHROSCOPY WITH DISTAL CLAVICLE RESECTION DEBRIDEMENT PARTIAL ROTATOR CUFF LABRAL TEAR REMOVAL LOOSE BODIES;  Surgeon: Kathryne Hitch, MD;  Location: [facility];  Service: Orthopedics;  Laterality: Right;  . TONSILLECTOMY    . TOTAL HIP ARTHROPLASTY Right 10/18/2016   Procedure: TOTAL HIP ARTHROPLASTY ANTERIOR APPROACH;  Surgeon: Ninetta Lights, MD;  Location: [facility];  Service: Orthopedics;  Laterality: Right;  . TOTAL KNEE ARTHROPLASTY Left 03/16/2020   Procedure: TOTAL KNEE ARTHROPLASTY;  Surgeon: Corky Mull, MD;  Location: [facility];  Service: Orthopedics;  Laterality: Left;  . UPPER GASTROINTESTINAL ENDOSCOPY  02/2013  . WRIST SURGERY Left     There were no vitals filed for this visit.  Subjective Assessment - 04/14/20 1005    Subjective  Pt stated she had a charlie horse, resolved with  walking.  Knee is sore and stiff today, pain scale 3/10.    Pertinent History  HTN, arthritis, history of left knee scope    Patient Stated Goals  to be able to walk better    Currently in Pain?  Yes    Pain Score  3     Pain Location  Knee    Pain Orientation  Left    Pain Descriptors / Indicators  Sore;Tightness    Pain Type  Surgical pain    Pain Radiating Towards  around the knee    Pain Onset  1 to 4 weeks ago    Pain Frequency  Constant    Aggravating Factors   movement and walking    Pain Relieving Factors  medication and ice                       [facility] Adult PT Treatment/Exercise - 04/14/20 0001      Exercises   Exercises  Knee/Hip      Knee/Hip Exercises: Stretches   Knee: Self-Stretch to increase Flexion  10 seconds;Left    Knee: Self-Stretch Limitations  10 reps onto 12" step    Gastroc Stretch  2 reps;30 seconds    Gastroc Stretch Limitations  slant board      Knee/Hip Exercises: Aerobic   Stationary Bike  4' rocking seat 7      Knee/Hip Exercises: Standing   Gait Training  outdoor gait with RW, safe mechanics no LOB      Knee/Hip Exercises: Seated   Long Arc Quad  10 reps    Heel Slides  PROM;5 reps   10" holds as tolerated     Knee/Hip Exercises: Supine   Heel Slides  Left;10 reps    Heel Slides Limitations  5" holds    Knee Extension  AROM    Knee Extension Limitations  7    Knee Flexion  AROM    Knee Flexion Limitations  85      Manual Therapy   Manual Therapy  Edema management;Soft tissue mobilization    Manual therapy comments  Manual complete separate than rest of tx    Edema Management  Retrograde massage for edema control with LE elevated    Soft tissue mobilization  to scar tissue, adhesed tissue to perimeter of knee  PT Short Term Goals - 04/08/20 1403      PT SHORT TERM GOAL #1   Title  Patient will be independent in self management strategies to improve quality of life and functional outcomes.     Time  4    Period  Weeks    Status  New    Target Date  05/06/20      PT SHORT TERM GOAL #2   Title  Patient will report at least 25% improvement in overall symptoms and/or functional ability.    Time  4    Period  Weeks    Status  New    Target Date  05/06/20      PT SHORT TERM GOAL #3   Title  Patient will demonstrate 0-100 degrees of left knee motion.    Time  4    Period  Weeks    Status  New    Target Date  05/06/20        PT Long Term Goals - 04/08/20 1403      PT LONG TERM GOAL #1   Title  Patient will be able to ambulate at least 226 feet wtih least restrictive device possible in 2 minutes to demonstrate improved functional mobility.    Time  8    Period  Weeks    Status  New    Target Date  06/03/20      PT LONG TERM GOAL #2   Title  Patient will be able to ascend and descend stairs with alternating gait pattern with use of arms as needed to improve community mobility.    Time  8    Period  Weeks    Status  New    Target Date  06/03/20      PT LONG TERM GOAL #3   Title  Patient will report at least 50% improvement in overall symptoms and/or functional ability    Time  8    Period  Weeks    Status  New    Target Date  06/03/20            Plan - 04/14/20 1036    Clinical Impression Statement  Added PROM for knee flexion with good tolerance.  Continued with manual to reduce edema, scar adhesions and pain.  Improved AROM 7-85 degrees (was 7-77 degrees last session).  Safety drill complete during session, pt able to demonstrate safe mechanics outdoors on uneven terrain in parking lot with no LOB episodes.    Personal Factors and Comorbidities  Comorbidity 1;Comorbidity 2;Age;Fitness    Comorbidities  left knee scope prior, DB, HTN    Examination-Activity Limitations  Bathing;Bed Mobility;Bend;Caring for Others;Carry;Dressing;Stairs;Squat;Sleep;Sit    Stability/Clinical Decision Making  Stable/Uncomplicated    Clinical Decision Making  Low    Rehab  Potential  Good    PT Frequency  --   3x/week for 3 weeks then 2x/week for 5 weeks   PT Treatment/Interventions  ADLs/Self Care Home Management;Iontophoresis 4mg /ml Dexamethasone;Gait training;Stair training;Neuromuscular re-education;Dry needling;Joint Manipulations;Scar mobilization;Passive range of motion;Manual techniques;Functional mobility training;Therapeutic activities;Therapeutic exercise;Balance training;DME Instruction;Electrical Stimulation;Cryotherapy;Biofeedback;Aquatic Therapy;Moist Heat;Traction;Ultrasound;Patient/family education    PT Next Visit Plan  knee ROM (flexion), Swelling, walking and LE strengthing    PT Home Exercise Plan  reviewed hospital exercises.       Patient will benefit from skilled therapeutic intervention in order to improve the following deficits and impairments:  Abnormal gait, Decreased range of motion, Difficulty walking, Decreased endurance, Decreased activity tolerance, Decreased skin integrity, Pain, Hypomobility,  Decreased scar mobility, Decreased knowledge of use of DME, Decreased balance, Decreased mobility, Decreased strength, Increased edema  Visit Diagnosis: Muscle weakness (generalized)  Localized edema  Difficulty in walking, not elsewhere classified  Chronic pain of left knee     Problem List Patient Active Problem List   Diagnosis Date Noted  . 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 S/P total hip arthroplasty 10/18/2016  . Hypersomnia with sleep apnea 07/20/2015  . Hypersomnia, persistent 07/20/2015  . OSA (obstructive sleep apnea) 07/20/2015  . Lumbar compression fracture ([place]) 12/21/2014  . Morbid obesity ([place]) 03/07/2013  . Obstructive sleep apnea 03/07/2013  . Fibromyalgia 03/07/2013  . Other and unspecified hyperlipidemia 03/07/2013  . Hx of gastritis 03/07/2013  . S/P cholecystectomy 03/07/2013  . Chest pain 01/07/2013   Ihor Austin, LPTA/CLT; CBIS [phone]  Aldona Lento 04/14/2020, 10:57 AM  [place], [place], [address] Phone: [phone]   Fax:  [phone]  Name: [name] MRN: [id number] Date of Birth: June 02, 1946

## 2020-04-16 ENCOUNTER — Other Ambulatory Visit: Payer: Self-pay

## 2020-04-16 ENCOUNTER — Encounter (HOSPITAL_COMMUNITY): Payer: Self-pay

## 2020-04-16 ENCOUNTER — Ambulatory Visit (HOSPITAL_COMMUNITY): Payer: Medicare Other

## 2020-04-16 DIAGNOSIS — G8929 Other chronic pain: Secondary | ICD-10-CM

## 2020-04-16 DIAGNOSIS — R600 Localized edema: Secondary | ICD-10-CM

## 2020-04-16 DIAGNOSIS — M6281 Muscle weakness (generalized): Secondary | ICD-10-CM

## 2020-04-16 DIAGNOSIS — R262 Difficulty in walking, not elsewhere classified: Secondary | ICD-10-CM

## 2020-04-16 DIAGNOSIS — M25562 Pain in left knee: Secondary | ICD-10-CM | POA: Diagnosis not present

## 2020-04-16 NOTE — Therapy (Signed)
[place], [place], [address] Phone: [phone]   Fax:  [phone]  Physical Therapy Treatment  Patient Details  Name: [name] MRN: [id number] Date of Birth: 1946/05/30 Referring Provider (PT): Milagros Evener   Encounter Date: 04/16/2020  PT End of Session - 04/16/20 1413    Visit Number  5    Number of Visits  19    Date for PT Re-Evaluation  06/03/20   POC 4/15-->06/03/20   Authorization Type  [employer]HC Medicare No DED, NO VL NO Auth, AQ included    Progress Note Due on Visit  10    PT Start Time  1402    PT Stop Time  1450    PT Time Calculation (min)  48 min    Activity Tolerance  Patient tolerated treatment well    Behavior During Therapy  [facility] for tasks assessed/performed       Past Medical History:  Diagnosis Date  . Allergy    SEASONAL-flonase daily as well as Claritin  . Anemia    takes Ferrous Sulfate daily  . Anxiety    takes Celexa every evening  . Arthritis    BACK/KNEE  . Cataract    IMMATURE AND ON LEFT EYE SHE THINKS  . Chronic back pain   . Chronic back pain    L1 FRACTURE  . Depression    takes Wellbutrin daily  . Diabetes mellitus without complication ([place])    type II  . Dyspnea    with exertion  . Fibromyalgia   . GERD (gastroesophageal reflux disease)    takes Omeprazole daily and Zantac at bedtime  . H/O hiatal hernia   . History of colon polyps   . History of shingles   . Hyperlipidemia    takes Pravastatin daily as well as Questran  . Hypertension    takes Diovan daily  . Hypothyroidism   . Insomnia    takes trazodone nightly  . Joint pain   . Muscle spasm    takes Robaxin daily as needed  . Nausea    takes Zofran daily as needed  . Nocturia   . Numbness and tingling    LEGS  . Osteoporosis    takes fosamax  . Sleep apnea    uses cpap  . Thyroid disease    takes Synthroid daily    Past Surgical History:  Procedure Laterality Date  . ABDOMINAL  HYSTERECTOMY    . CARDIAC CATHETERIZATION    . CARPAL TUNNEL RELEASE     right and lt  . CHOLECYSTECTOMY    . COLONOSCOPY    . COLONOSCOPY    . EYE SURGERY Bilateral    Cataract with lens  . FOOT SURGERY Bilateral   . FRACTURE SURGERY     rt hand.  no metal  . FRACTURE SURGERY     lr wrist. no metal  . HAND SURGERY Right 06/1985  . HAND SURGERY Left   . JOINT REPLACEMENT Right    THR  . KNEE ARTHROSCOPY Left   . KNEE ARTHROSCOPY Bilateral   . KYPHOPLASTY N/A 12/21/2014   Procedure: KYPHOPLASTY LUMBAR ONE;  Surgeon: Newman Pies, MD;  Location: [facility];  Service: Neurosurgery;  Laterality: N/A;  L1 Kyphoplasty  . ORIF FINGER FRACTURE  04/11/2012   Procedure: OPEN REDUCTION INTERNAL FIXATION (ORIF) METACARPAL (FINGER) FRACTURE;  Surgeon: Ninetta Lights, MD;  Location: [facility];  Service: Orthopedics;  Laterality: Left;  orif left hand 4th metacarpal   . 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 RECTOCELE REPAIR    . SHOULDER ARTHROSCOPY WITH DISTAL CLAVICLE RESECTION Right 05/20/2015   Procedure: SHOULDER ARTHROSCOPY WITH DISTAL CLAVICLE RESECTION DEBRIDEMENT PARTIAL ROTATOR CUFF LABRAL TEAR REMOVAL LOOSE BODIES;  Surgeon: Kathryne Hitch, MD;  Location: [facility];  Service: Orthopedics;  Laterality: Right;  . TONSILLECTOMY    . TOTAL HIP ARTHROPLASTY Right 10/18/2016   Procedure: TOTAL HIP ARTHROPLASTY ANTERIOR APPROACH;  Surgeon: Ninetta Lights, MD;  Location: [facility];  Service: Orthopedics;  Laterality: Right;  . TOTAL KNEE ARTHROPLASTY Left 03/16/2020   Procedure: TOTAL KNEE ARTHROPLASTY;  Surgeon: Corky Mull, MD;  Location: [facility];  Service: Orthopedics;  Laterality: Left;  . UPPER GASTROINTESTINAL ENDOSCOPY  02/2013  . WRIST SURGERY Left     There were no vitals filed for this visit.  Subjective Assessment - 04/16/20 1412    Subjective  Pt stated she felt increased flexibilty following last session, knee is sore and a  little stiff today.  Pain scale 2/10 today.    Pertinent History  HTN, arthritis, history of left knee scope    Patient Stated Goals  to be able to walk better    Currently in Pain?  Yes    Pain Score  2     Pain Location  Knee    Pain Orientation  Left    Pain Descriptors / Indicators  Sore;Tightness    Pain Type  Surgical pain    Pain Radiating Towards  around the knee    Pain Onset  1 to 4 weeks ago    Pain Frequency  Constant    Aggravating Factors   movement and walking    Pain Relieving Factors  medication and ice                       [facility] Adult PT Treatment/Exercise - 04/16/20 0001      Exercises   Exercises  Knee/Hip      Knee/Hip Exercises: Stretches   Knee: Self-Stretch to increase Flexion  10 seconds;Left    Knee: Self-Stretch Limitations  10 reps onto 12" step    Gastroc Stretch  2 reps;30 seconds    Gastroc Stretch Limitations  slant board      Knee/Hip Exercises: Aerobic   Stationary Bike  4' rocking seat 7      Knee/Hip Exercises: Seated   Long Arc Quad  10 reps    Heel Slides  PROM;5 reps   10" holds   Heel Slides Limitations  AAROM with Rt LE x 5      Knee/Hip Exercises: Supine   Quad Sets  10 reps    Short Arc Quad Sets  10 reps;Left    Heel Slides  Left;10 reps    Heel Slides Limitations  5" holds    Knee Extension  AROM    Knee Extension Limitations  7    Knee Flexion  AROM    Knee Flexion Limitations  87      Manual Therapy   Manual Therapy  Edema management;Soft tissue mobilization    Manual therapy comments  Manual complete separate than rest of tx    Edema Management  Retrograde massage for edema control with LE elevated    Soft tissue mobilization  to scar tissue, adhesed tissue to perimeter of knee  PT Short Term Goals - 04/08/20 1403      PT SHORT TERM GOAL #1   Title  Patient will be independent in self management strategies to improve quality of life and functional outcomes.    Time  4     Period  Weeks    Status  New    Target Date  05/06/20      PT SHORT TERM GOAL #2   Title  Patient will report at least 25% improvement in overall symptoms and/or functional ability.    Time  4    Period  Weeks    Status  New    Target Date  05/06/20      PT SHORT TERM GOAL #3   Title  Patient will demonstrate 0-100 degrees of left knee motion.    Time  4    Period  Weeks    Status  New    Target Date  05/06/20        PT Long Term Goals - 04/08/20 1403      PT LONG TERM GOAL #1   Title  Patient will be able to ambulate at least 226 feet wtih least restrictive device possible in 2 minutes to demonstrate improved functional mobility.    Time  8    Period  Weeks    Status  New    Target Date  06/03/20      PT LONG TERM GOAL #2   Title  Patient will be able to ascend and descend stairs with alternating gait pattern with use of arms as needed to improve community mobility.    Time  8    Period  Weeks    Status  New    Target Date  06/03/20      PT LONG TERM GOAL #3   Title  Patient will report at least 50% improvement in overall symptoms and/or functional ability    Time  8    Period  Weeks    Status  New    Target Date  06/03/20            Plan - 04/16/20 1452    Clinical Impression Statement  Continued session focus with ROM especially flexion.  Pt tolerated well to PROM in seated position.  Improved AROM 7-97 degrees.  Noted increased edema present medial aspect of knee, increased manual retrograde massage to medial aspect for edema and pain control.    Personal Factors and Comorbidities  Comorbidity 1;Comorbidity 2;Age;Fitness    Comorbidities  left knee scope prior, DB, HTN    Examination-Activity Limitations  Bathing;Bed Mobility;Bend;Caring for Others;Carry;Dressing;Stairs;Squat;Sleep;Sit    Stability/Clinical Decision Making  Stable/Uncomplicated    Clinical Decision Making  Low    Rehab Potential  Good    PT Frequency  --   3x/week for 3 weeks then  2x/week for 5 weeks   PT Duration  8 weeks    PT Treatment/Interventions  ADLs/Self Care Home Management;Iontophoresis 4mg /ml Dexamethasone;Gait training;Stair training;Neuromuscular re-education;Dry needling;Joint Manipulations;Scar mobilization;Passive range of motion;Manual techniques;Functional mobility training;Therapeutic activities;Therapeutic exercise;Balance training;DME Instruction;Electrical Stimulation;Cryotherapy;Biofeedback;Aquatic Therapy;Moist Heat;Traction;Ultrasound;Patient/family education    PT Next Visit Plan  Add rocker board and gait training SBQC next session.  Continue focus with knee ROM (flexion), Swelling, walking and LE strengthing    PT Home Exercise Plan  reviewed hospital exercises.       Patient will benefit from skilled therapeutic intervention in order to improve the following deficits and impairments:  Abnormal gait, Decreased range of motion, Difficulty  walking, Decreased endurance, Decreased activity tolerance, Decreased skin integrity, Pain, Hypomobility, Decreased scar mobility, Decreased knowledge of use of DME, Decreased balance, Decreased mobility, Decreased strength, Increased edema  Visit Diagnosis: Localized edema  Difficulty in walking, not elsewhere classified  Chronic pain of left knee  Muscle weakness (generalized)     Problem List Patient Active Problem List   Diagnosis Date Noted  . 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 S/P total hip arthroplasty 10/18/2016  . Hypersomnia with sleep apnea 07/20/2015  . Hypersomnia, persistent 07/20/2015  . OSA (obstructive sleep apnea) 07/20/2015  . Lumbar compression fracture ([place]) 12/21/2014  . Morbid obesity ([place]) 03/07/2013  . Obstructive sleep apnea 03/07/2013  . Fibromyalgia 03/07/2013  . Other and unspecified hyperlipidemia 03/07/2013  . Hx of gastritis 03/07/2013  . S/P cholecystectomy 03/07/2013  . Chest pain 01/07/2013   Ihor Austin, LPTA/CLT;  CBIS [phone]  Aldona Lento 04/16/2020, 3:03 PM  [place], [place], [address] Phone: [phone]   Fax:  [phone]  Name: [name] MRN: [id number] Date of Birth: [birth year]

## 2020-04-19 ENCOUNTER — Ambulatory Visit (HOSPITAL_COMMUNITY): Payer: Medicare Other | Admitting: Physical Therapy

## 2020-04-19 ENCOUNTER — Other Ambulatory Visit: Payer: Self-pay

## 2020-04-19 DIAGNOSIS — G8929 Other chronic pain: Secondary | ICD-10-CM

## 2020-04-19 DIAGNOSIS — M25562 Pain in left knee: Secondary | ICD-10-CM

## 2020-04-19 DIAGNOSIS — R600 Localized edema: Secondary | ICD-10-CM

## 2020-04-19 DIAGNOSIS — M6281 Muscle weakness (generalized): Secondary | ICD-10-CM

## 2020-04-19 DIAGNOSIS — R262 Difficulty in walking, not elsewhere classified: Secondary | ICD-10-CM

## 2020-04-19 NOTE — Therapy (Signed)
[place], [place], [address] Phone: [phone]   Fax:  [phone]  Physical Therapy Treatment  Patient Details  Name: [name] MRN: [id number] Date of Birth: 07-04-1946 Referring Provider (PT): Milagros Evener   Encounter Date: 04/19/2020  PT End of Session - 04/19/20 1332    Visit Number  6    Number of Visits  19    Date for PT Re-Evaluation  06/03/20   POC 4/15-->06/03/20   Authorization Type  UHC Medicare No DED, NO VL NO Auth, AQ included    Progress Note Due on Visit  10    PT Start Time  1315    PT Stop Time  1355    PT Time Calculation (min)  40 min    Activity Tolerance  Patient tolerated treatment well    Behavior During Therapy  [facility] for tasks assessed/performed       Past Medical History:  Diagnosis Date  . Allergy    SEASONAL-flonase daily as well as Claritin  . Anemia    takes Ferrous Sulfate daily  . Anxiety    takes Celexa every evening  . Arthritis    BACK/KNEE  . Cataract    IMMATURE AND ON LEFT EYE SHE THINKS  . Chronic back pain   . Chronic back pain    L1 FRACTURE  . Depression    takes Wellbutrin daily  . Diabetes mellitus without complication ([place])    type II  . Dyspnea    with exertion  . Fibromyalgia   . GERD (gastroesophageal reflux disease)    takes Omeprazole daily and Zantac at bedtime  . H/O hiatal hernia   . History of colon polyps   . History of shingles   . Hyperlipidemia    takes Pravastatin daily as well as Questran  . Hypertension    takes Diovan daily  . Hypothyroidism   . Insomnia    takes trazodone nightly  . Joint pain   . Muscle spasm    takes Robaxin daily as needed  . Nausea    takes Zofran daily as needed  . Nocturia   . Numbness and tingling    LEGS  . Osteoporosis    takes fosamax  . Sleep apnea    uses cpap  . Thyroid disease    takes Synthroid daily    Past Surgical History:  Procedure Laterality Date  . ABDOMINAL  HYSTERECTOMY    . CARDIAC CATHETERIZATION    . CARPAL TUNNEL RELEASE     right and lt  . CHOLECYSTECTOMY    . COLONOSCOPY    . COLONOSCOPY    . EYE SURGERY Bilateral    Cataract with lens  . FOOT SURGERY Bilateral   . FRACTURE SURGERY     rt hand.  no metal  . FRACTURE SURGERY     lr wrist. no metal  . HAND SURGERY Right 06/1985  . HAND SURGERY Left   . JOINT REPLACEMENT Right    THR  . KNEE ARTHROSCOPY Left   . KNEE ARTHROSCOPY Bilateral   . KYPHOPLASTY N/A 12/21/2014   Procedure: KYPHOPLASTY LUMBAR ONE;  Surgeon: Newman Pies, MD;  Location: [place] [facility];  Service: Neurosurgery;  Laterality: N/A;  L1 Kyphoplasty  . ORIF FINGER FRACTURE  04/11/2012   Procedure: OPEN REDUCTION INTERNAL FIXATION (ORIF) METACARPAL (FINGER) FRACTURE;  Surgeon: Ninetta Lights, MD;  Location: [place];  Service: Orthopedics;  Laterality: Left;  orif left hand 4th metacarpal   . 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 RECTOCELE REPAIR    . SHOULDER ARTHROSCOPY WITH DISTAL CLAVICLE RESECTION Right 05/20/2015   Procedure: SHOULDER ARTHROSCOPY WITH DISTAL CLAVICLE RESECTION DEBRIDEMENT PARTIAL ROTATOR CUFF LABRAL TEAR REMOVAL LOOSE BODIES;  Surgeon: Kathryne Hitch, MD;  Location: [place];  Service: Orthopedics;  Laterality: Right;  . TONSILLECTOMY    . TOTAL HIP ARTHROPLASTY Right 10/18/2016   Procedure: TOTAL HIP ARTHROPLASTY ANTERIOR APPROACH;  Surgeon: Ninetta Lights, MD;  Location: [place];  Service: Orthopedics;  Laterality: Right;  . TOTAL KNEE ARTHROPLASTY Left 03/16/2020   Procedure: TOTAL KNEE ARTHROPLASTY;  Surgeon: Corky Mull, MD;  Location: [facility];  Service: Orthopedics;  Laterality: Left;  . UPPER GASTROINTESTINAL ENDOSCOPY  02/2013  . WRIST SURGERY Left     There were no vitals filed for this visit.  Subjective Assessment - 04/19/20 1318    Subjective  States she has about 3/10 in the front of the knee. States she also has bruises on  her tummy too from her her shots to prevent blood clots.    Pertinent History  HTN, arthritis, history of left knee scope    Patient Stated Goals  to be able to walk better    Currently in Pain?  Yes    Pain Score  3     Pain Onset  1 to 4 weeks ago         [facility] PT Assessment - 04/19/20 0001      Assessment   Medical Diagnosis  L TKA    Referring Provider (PT)  John Poggi    Onset Date/Surgical Date  03/16/20    Next MD Visit  04/26/20                   [facility] Adult PT Treatment/Exercise - 04/19/20 0001      Ambulation/Gait   Ambulation/Gait  Yes    Ambulation/Gait Assistance  6: Modified independent (Device/Increase time)    Ambulation Distance (Feet)  226 Feet    Assistive device  Small based quad cane    Gait Comments  cues to bend knee with walking - prior demo with cane      Knee/Hip Exercises: Stretches   Press photographer  Both;3 reps;30 seconds   slant board     Knee/Hip Exercises: Standing   Lateral Step Up  Both;3 sets;10 reps;Hand Hold: 1;Step Height: 4"    Forward Step Up  2 sets;15 reps;Both;Hand Hold: 1;Step Height: 6"             PT Education - 04/19/20 1355    Education Details  on use of cane, on continued progress and plan moving forward.    Person(s) Educated  Patient    Methods  Explanation    Comprehension  Verbalized understanding       PT Short Term Goals - 04/08/20 1403      PT SHORT TERM GOAL #1   Title  Patient will be independent in self management strategies to improve quality of life and functional outcomes.    Time  4    Period  Weeks    Status  New    Target Date  05/06/20      PT SHORT TERM GOAL #2   Title  Patient will report at least 25% improvement in overall symptoms and/or functional ability.    Time  4    Period  Weeks  Status  New    Target Date  05/06/20      PT SHORT TERM GOAL #3   Title  Patient will demonstrate 0-100 degrees of left knee motion.    Time  4    Period  Weeks    Status  New     Target Date  05/06/20        PT Long Term Goals - 04/08/20 1403      PT LONG TERM GOAL #1   Title  Patient will be able to ambulate at least 226 feet wtih least restrictive device possible in 2 minutes to demonstrate improved functional mobility.    Time  8    Period  Weeks    Status  New    Target Date  06/03/20      PT LONG TERM GOAL #2   Title  Patient will be able to ascend and descend stairs with alternating gait pattern with use of arms as needed to improve community mobility.    Time  8    Period  Weeks    Status  New    Target Date  06/03/20      PT LONG TERM GOAL #3   Title  Patient will report at least 50% improvement in overall symptoms and/or functional ability    Time  8    Period  Weeks    Status  New    Target Date  06/03/20            Plan - 04/19/20 1356    Clinical Impression Statement  Educated patient on using cane and practiced in clinic. Discussed and educated patient in using cane at home and to transition to community when she felt more confident with it. Increased step height today. Overall patient is doing very well. Will continue to work on functional strength and ROM as tolerated by patient.    Personal Factors and Comorbidities  Comorbidity 1;Comorbidity 2;Age;Fitness    Comorbidities  left knee scope prior, DB, HTN    Examination-Activity Limitations  Bathing;Bed Mobility;Bend;Caring for Others;Carry;Dressing;Stairs;Squat;Sleep;Sit    Stability/Clinical Decision Making  Stable/Uncomplicated    Rehab Potential  Good    PT Frequency  --   3x/week for 3 weeks then 2x/week for 5 weeks   PT Duration  8 weeks    PT Treatment/Interventions  ADLs/Self Care Home Management;Iontophoresis 4mg /ml Dexamethasone;Gait training;Stair training;Neuromuscular re-education;Dry needling;Joint Manipulations;Scar mobilization;Passive range of motion;Manual techniques;Functional mobility training;Therapeutic activities;Therapeutic exercise;Balance training;DME  Instruction;Electrical Stimulation;Cryotherapy;Biofeedback;Aquatic Therapy;Moist Heat;Traction;Ultrasound;Patient/family education    PT Next Visit Plan  Add rocker board and gait training SBQC next session.  Continue focus with knee ROM (flexion), Swelling, walking and LE strengthing    PT Home Exercise Plan  using cane at home.       Patient will benefit from skilled therapeutic intervention in order to improve the following deficits and impairments:  Abnormal gait, Decreased range of motion, Difficulty walking, Decreased endurance, Decreased activity tolerance, Decreased skin integrity, Pain, Hypomobility, Decreased scar mobility, Decreased knowledge of use of DME, Decreased balance, Decreased mobility, Decreased strength, Increased edema  Visit Diagnosis: Localized edema  Difficulty in walking, not elsewhere classified  Chronic pain of left knee  Muscle weakness (generalized)     Problem List Patient Active Problem List   Diagnosis Date Noted  . 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 S/P total hip arthroplasty 10/18/2016  . Hypersomnia with sleep apnea 07/20/2015  . Hypersomnia, persistent 07/20/2015  . OSA (obstructive sleep  apnea) 07/20/2015  . Lumbar compression fracture ([place]) 12/21/2014  . Morbid obesity ([place]) 03/07/2013  . Obstructive sleep apnea 03/07/2013  . Fibromyalgia 03/07/2013  . Other and unspecified hyperlipidemia 03/07/2013  . Hx of gastritis 03/07/2013  . S/P cholecystectomy 03/07/2013  . Chest pain 01/07/2013   3:01 PM, 04/19/20 Jerene Pitch, DPT Physical Therapy with [facility]  [phone] office  [place] [address] [place], [place], [address] Phone: [phone]   Fax:  [phone]  Name: [name] MRN: [id number] Date of Birth: 1946-05-23

## 2020-04-21 ENCOUNTER — Ambulatory Visit (HOSPITAL_COMMUNITY): Payer: Medicare Other | Admitting: Physical Therapy

## 2020-04-21 ENCOUNTER — Other Ambulatory Visit: Payer: Self-pay

## 2020-04-21 DIAGNOSIS — M25562 Pain in left knee: Secondary | ICD-10-CM | POA: Diagnosis not present

## 2020-04-21 DIAGNOSIS — G8929 Other chronic pain: Secondary | ICD-10-CM

## 2020-04-21 DIAGNOSIS — R600 Localized edema: Secondary | ICD-10-CM

## 2020-04-21 DIAGNOSIS — R262 Difficulty in walking, not elsewhere classified: Secondary | ICD-10-CM

## 2020-04-21 NOTE — Therapy (Signed)
[place], [place], [address] Phone: [phone]   Fax:  [phone]  Physical Therapy Treatment  Patient Details  Name: [name] MRN: [id number] Date of Birth: [birth year] Referring Provider (PT): Milagros Evener   Encounter Date: 04/21/2020  PT End of Session - 04/21/20 1152    Visit Number  7    Number of Visits  19    Date for PT Re-Evaluation  06/03/20   POC 4/15-->06/03/20   Authorization Type  UHC Medicare No DED, NO VL NO Auth, AQ included    Progress Note Due on Visit  10    PT Start Time  1138    PT Stop Time  1216    PT Time Calculation (min)  38 min    Activity Tolerance  Patient tolerated treatment well    Behavior During Therapy  [facility] for tasks assessed/performed       Past Medical History:  Diagnosis Date  . Allergy    SEASONAL-flonase daily as well as Claritin  . Anemia    takes Ferrous Sulfate daily  . Anxiety    takes Celexa every evening  . Arthritis    BACK/KNEE  . Cataract    IMMATURE AND ON LEFT EYE SHE THINKS  . Chronic back pain   . Chronic back pain    L1 FRACTURE  . Depression    takes Wellbutrin daily  . Diabetes mellitus without complication ([place])    type II  . Dyspnea    with exertion  . Fibromyalgia   . GERD (gastroesophageal reflux disease)    takes Omeprazole daily and Zantac at bedtime  . H/O hiatal hernia   . History of colon polyps   . History of shingles   . Hyperlipidemia    takes Pravastatin daily as well as Questran  . Hypertension    takes Diovan daily  . Hypothyroidism   . Insomnia    takes trazodone nightly  . Joint pain   . Muscle spasm    takes Robaxin daily as needed  . Nausea    takes Zofran daily as needed  . Nocturia   . Numbness and tingling    LEGS  . Osteoporosis    takes fosamax  . Sleep apnea    uses cpap  . Thyroid disease    takes Synthroid daily    Past Surgical History:  Procedure Laterality Date  . ABDOMINAL  HYSTERECTOMY    . CARDIAC CATHETERIZATION    . CARPAL TUNNEL RELEASE     right and lt  . CHOLECYSTECTOMY    . COLONOSCOPY    . COLONOSCOPY    . EYE SURGERY Bilateral    Cataract with lens  . FOOT SURGERY Bilateral   . FRACTURE SURGERY     rt hand.  no metal  . FRACTURE SURGERY     lr wrist. no metal  . HAND SURGERY Right 06/1985  . HAND SURGERY Left   . JOINT REPLACEMENT Right    THR  . KNEE ARTHROSCOPY Left   . KNEE ARTHROSCOPY Bilateral   . KYPHOPLASTY N/A 12/21/2014   Procedure: KYPHOPLASTY LUMBAR ONE;  Surgeon: Newman Pies, MD;  Location: [place] [facility];  Service: Neurosurgery;  Laterality: N/A;  L1 Kyphoplasty  . ORIF FINGER FRACTURE  04/11/2012   Procedure: OPEN REDUCTION INTERNAL FIXATION (ORIF) METACARPAL (FINGER) FRACTURE;  Surgeon: Ninetta Lights, MD;  Location: [place];  Service: Orthopedics;  Laterality: Left;  orif left hand 4th metacarpal   . 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 RECTOCELE REPAIR    . SHOULDER ARTHROSCOPY WITH DISTAL CLAVICLE RESECTION Right 05/20/2015   Procedure: SHOULDER ARTHROSCOPY WITH DISTAL CLAVICLE RESECTION DEBRIDEMENT PARTIAL ROTATOR CUFF LABRAL TEAR REMOVAL LOOSE BODIES;  Surgeon: Kathryne Hitch, MD;  Location: [place];  Service: Orthopedics;  Laterality: Right;  . TONSILLECTOMY    . TOTAL HIP ARTHROPLASTY Right 10/18/2016   Procedure: TOTAL HIP ARTHROPLASTY ANTERIOR APPROACH;  Surgeon: Ninetta Lights, MD;  Location: [place];  Service: Orthopedics;  Laterality: Right;  . TOTAL KNEE ARTHROPLASTY Left 03/16/2020   Procedure: TOTAL KNEE ARTHROPLASTY;  Surgeon: Corky Mull, MD;  Location: [facility];  Service: Orthopedics;  Laterality: Left;  . UPPER GASTROINTESTINAL ENDOSCOPY  02/2013  . WRIST SURGERY Left     There were no vitals filed for this visit.  Subjective Assessment - 04/21/20 1142    Subjective  pt states her pain in a 2/10 today in her Lt knee.  STates she's been using her QC  "some" at home.    Currently in Pain?  Yes    Pain Score  2     Pain Location  Knee    Pain Orientation  Left    Pain Descriptors / Indicators  Tightness;Sore    Pain Type  Surgical pain                       [facility] Adult PT Treatment/Exercise - 04/21/20 0001      Ambulation/Gait   Ambulation/Gait  Yes    Ambulation/Gait Assistance  6: Modified independent (Device/Increase time)    Ambulation Distance (Feet)  226 Feet    Assistive device  Small based quad cane    Gait Comments  cues to bend knee with walking - prior demo with cane      Knee/Hip Exercises: Stretches   Active Hamstring Stretch  Left;30 seconds;3 reps    Active Hamstring Stretch Limitations  onto 12" step    Knee: Self-Stretch to increase Flexion  10 seconds;Left    Knee: Self-Stretch Limitations  10 reps onto 12" step    Gastroc Stretch  Both;3 reps;30 seconds    Gastroc Stretch Limitations  slant board      Knee/Hip Exercises: Supine   Knee Extension  AROM    Knee Extension Limitations  10    Knee Flexion  AROM    Knee Flexion Limitations  90      Manual Therapy   Manual Therapy  Edema management;Soft tissue mobilization;Passive ROM    Manual therapy comments  Manual complete separate than rest of tx    Edema Management  Retrograde massage for edema control with LE elevated    Soft tissue mobilization  to scar tissue, adhesed tissue to perimeter of knee    Passive ROM  AAROM for flexion and extension (2 more degrees achieved each way)               PT Short Term Goals - 04/08/20 1403      PT SHORT TERM GOAL #1   Title  Patient will be independent in self management strategies to improve quality of life and functional outcomes.    Time  4    Period  Weeks    Status  New    Target Date  05/06/20      PT SHORT TERM GOAL #2   Title  Patient  will report at least 25% improvement in overall symptoms and/or functional ability.    Time  4    Period  Weeks    Status  New    Target  Date  05/06/20      PT SHORT TERM GOAL #3   Title  Patient will demonstrate 0-100 degrees of left knee motion.    Time  4    Period  Weeks    Status  New    Target Date  05/06/20        PT Long Term Goals - 04/08/20 1403      PT LONG TERM GOAL #1   Title  Patient will be able to ambulate at least 226 feet wtih least restrictive device possible in 2 minutes to demonstrate improved functional mobility.    Time  8    Period  Weeks    Status  New    Target Date  06/03/20      PT LONG TERM GOAL #2   Title  Patient will be able to ascend and descend stairs with alternating gait pattern with use of arms as needed to improve community mobility.    Time  8    Period  Weeks    Status  New    Target Date  06/03/20      PT LONG TERM GOAL #3   Title  Patient will report at least 50% improvement in overall symptoms and/or functional ability    Time  8    Period  Weeks    Status  New    Target Date  06/03/20            Plan - 04/21/20 1152    Clinical Impression Statement  Discontinued step ups at this point as patient continues to be very limited in ROM and focused on this.  Worked on stretches, ambulation with NBQC and manual to reduce adhesions and increase knee ROM.  ROM this session 10-90.  Discussed possibility of ordering thigh high compression for swelling with pateint.  Returns to MD on Monday.    Personal Factors and Comorbidities  Comorbidity 1;Comorbidity 2;Age;Fitness    Comorbidities  left knee scope prior, DB, HTN    Examination-Activity Limitations  Bathing;Bed Mobility;Bend;Caring for Others;Carry;Dressing;Stairs;Squat;Sleep;Sit    Stability/Clinical Decision Making  Stable/Uncomplicated    Rehab Potential  Good    PT Frequency  --   3x/week for 3 weeks then 2x/week for 5 weeks   PT Duration  8 weeks    PT Treatment/Interventions  ADLs/Self Care Home Management;Iontophoresis 4mg /ml Dexamethasone;Gait training;Stair training;Neuromuscular re-education;Dry  needling;Joint Manipulations;Scar mobilization;Passive range of motion;Manual techniques;Functional mobility training;Therapeutic activities;Therapeutic exercise;Balance training;DME Instruction;Electrical Stimulation;Cryotherapy;Biofeedback;Aquatic Therapy;Moist Heat;Traction;Ultrasound;Patient/family education    PT Next Visit Plan  Continue focus with knee ROM (flexion) reducing Swelling and ambulation. PRogress to strengthening when ROM improved. F/U if pt would like a compression garment.  Complete PN for MD appt on Monday.    PT Home Exercise Plan  using cane at home.       Patient will benefit from skilled therapeutic intervention in order to improve the following deficits and impairments:  Abnormal gait, Decreased range of motion, Difficulty walking, Decreased endurance, Decreased activity tolerance, Decreased skin integrity, Pain, Hypomobility, Decreased scar mobility, Decreased knowledge of use of DME, Decreased balance, Decreased mobility, Decreased strength, Increased edema  Visit Diagnosis: Difficulty in walking, not elsewhere classified  Chronic pain of left knee  Localized edema     Problem List Patient Active Problem List  Diagnosis Date Noted  . 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 S/P total hip arthroplasty 10/18/2016  . Hypersomnia with sleep apnea 07/20/2015  . Hypersomnia, persistent 07/20/2015  . OSA (obstructive sleep apnea) 07/20/2015  . Lumbar compression fracture ([place]) 12/21/2014  . Morbid obesity ([place]) 03/07/2013  . Obstructive sleep apnea 03/07/2013  . Fibromyalgia 03/07/2013  . Other and unspecified hyperlipidemia 03/07/2013  . Hx of gastritis 03/07/2013  . S/P cholecystectomy 03/07/2013  . Chest pain 01/07/2013   [name], PTA/CLT [phone]  [name] 04/21/2020, 12:25 PM  [place], [place], [address] Phone: [phone]   Fax:   [phone]  Name: [name] MRN: [id number] Date of Birth: 1946/08/08

## 2020-04-23 ENCOUNTER — Ambulatory Visit (HOSPITAL_COMMUNITY): Payer: Medicare Other | Admitting: Physical Therapy

## 2020-04-23 ENCOUNTER — Other Ambulatory Visit: Payer: Self-pay

## 2020-04-23 DIAGNOSIS — M6281 Muscle weakness (generalized): Secondary | ICD-10-CM

## 2020-04-23 DIAGNOSIS — G8929 Other chronic pain: Secondary | ICD-10-CM

## 2020-04-23 DIAGNOSIS — R600 Localized edema: Secondary | ICD-10-CM

## 2020-04-23 DIAGNOSIS — M25562 Pain in left knee: Secondary | ICD-10-CM | POA: Diagnosis not present

## 2020-04-23 DIAGNOSIS — R262 Difficulty in walking, not elsewhere classified: Secondary | ICD-10-CM

## 2020-04-23 NOTE — Therapy (Signed)
[facility] [place], [place], [address] Phone: [phone]   Fax:  [phone]  Physical Therapy Treatment  Patient Details  Name: [name] MRN: [id number] Date of Birth: 1946/05/29 Referring Provider (PT): Milagros Evener   Encounter Date: 04/23/2020  PT End of Session - 04/23/20 1012    Visit Number  8    Number of Visits  19    Date for PT Re-Evaluation  06/03/20   POC 4/15-->06/03/20   Authorization Type  UHC Medicare No DED, NO VL NO Auth, AQ included    Progress Note Due on Visit  10    PT Start Time  1000    PT Stop Time  1040    PT Time Calculation (min)  40 min    Activity Tolerance  Patient tolerated treatment well    Behavior During Therapy  [facility] for tasks assessed/performed       Past Medical History:  Diagnosis Date  . Allergy    SEASONAL-flonase daily as well as Claritin  . Anemia    takes Ferrous Sulfate daily  . Anxiety    takes Celexa every evening  . Arthritis    BACK/KNEE  . Cataract    IMMATURE AND ON LEFT EYE SHE THINKS  . Chronic back pain   . Chronic back pain    L1 FRACTURE  . Depression    takes Wellbutrin daily  . Diabetes mellitus without complication ([place])    type II  . Dyspnea    with exertion  . Fibromyalgia   . GERD (gastroesophageal reflux disease)    takes Omeprazole daily and Zantac at bedtime  . H/O hiatal hernia   . History of colon polyps   . History of shingles   . Hyperlipidemia    takes Pravastatin daily as well as Questran  . Hypertension    takes Diovan daily  . Hypothyroidism   . Insomnia    takes trazodone nightly  . Joint pain   . Muscle spasm    takes Robaxin daily as needed  . Nausea    takes Zofran daily as needed  . Nocturia   . Numbness and tingling    LEGS  . Osteoporosis    takes fosamax  . Sleep apnea    uses cpap  . Thyroid disease    takes Synthroid daily    Past Surgical History:  Procedure Laterality Date  . ABDOMINAL  HYSTERECTOMY    . CARDIAC CATHETERIZATION    . CARPAL TUNNEL RELEASE     right and lt  . CHOLECYSTECTOMY    . COLONOSCOPY    . COLONOSCOPY    . EYE SURGERY Bilateral    Cataract with lens  . FOOT SURGERY Bilateral   . FRACTURE SURGERY     rt hand.  no metal  . FRACTURE SURGERY     lr wrist. no metal  . HAND SURGERY Right 06/1985  . HAND SURGERY Left   . JOINT REPLACEMENT Right    THR  . KNEE ARTHROSCOPY Left   . KNEE ARTHROSCOPY Bilateral   . KYPHOPLASTY N/A 12/21/2014   Procedure: KYPHOPLASTY LUMBAR ONE;  Surgeon: Newman Pies, MD;  Location: [facility];  Service: Neurosurgery;  Laterality: N/A;  L1 Kyphoplasty  . ORIF FINGER FRACTURE  04/11/2012   Procedure: OPEN REDUCTION INTERNAL FIXATION (ORIF) METACARPAL (FINGER) FRACTURE;  Surgeon: Ninetta Lights, MD;  Location: [facility];  Service: Orthopedics;  Laterality: Left;  orif left hand 4th metacarpal   . 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 RECTOCELE REPAIR    . SHOULDER ARTHROSCOPY WITH DISTAL CLAVICLE RESECTION Right 05/20/2015   Procedure: SHOULDER ARTHROSCOPY WITH DISTAL CLAVICLE RESECTION DEBRIDEMENT PARTIAL ROTATOR CUFF LABRAL TEAR REMOVAL LOOSE BODIES;  Surgeon: Kathryne Hitch, MD;  Location: [facility];  Service: Orthopedics;  Laterality: Right;  . TONSILLECTOMY    . TOTAL HIP ARTHROPLASTY Right 10/18/2016   Procedure: TOTAL HIP ARTHROPLASTY ANTERIOR APPROACH;  Surgeon: Ninetta Lights, MD;  Location: [facility];  Service: Orthopedics;  Laterality: Right;  . TOTAL KNEE ARTHROPLASTY Left 03/16/2020   Procedure: TOTAL KNEE ARTHROPLASTY;  Surgeon: Corky Mull, MD;  Location: [facility];  Service: Orthopedics;  Laterality: Left;  . UPPER GASTROINTESTINAL ENDOSCOPY  02/2013  . WRIST SURGERY Left     There were no vitals filed for this visit.  Subjective Assessment - 04/23/20 1002    Subjective  Pt states that she has been doing her exercises at home.  She is walking with her  cane at home.    Pertinent History  HTN, arthritis, history of left knee scope    Patient Stated Goals  to be able to walk better    Currently in Pain?  Yes    Pain Score  2     Pain Location  Knee    Pain Orientation  Left    Pain Descriptors / Indicators  Tender    Pain Type  Surgical pain    Pain Onset  1 to 4 weeks ago    Aggravating Factors   bending    Pain Relieving Factors  icing                       [facility] Adult PT Treatment/Exercise - 04/23/20 0001      Exercises   Exercises  Knee/Hip      Knee/Hip Exercises: Stretches   Active Hamstring Stretch  Left;3 reps;30 seconds    Active Hamstring Stretch Limitations  supine     Gastroc Stretch  Both;3 reps;30 seconds    Gastroc Stretch Limitations  slant board      Knee/Hip Exercises: Standing   Heel Raises  Both;10 reps    Knee Flexion  Left;10 reps    Terminal Knee Extension  Left;10 reps      Knee/Hip Exercises: Seated   Long Arc Quad  10 reps    Heel Slides  PROM;10 reps   10" holds     Knee/Hip Exercises: Supine   Quad Sets  10 reps    Heel Slides  Left;10 reps    Bridges  10 reps    Knee Extension  AROM    Knee Extension Limitations  8    Knee Flexion  AROM    Knee Flexion Limitations  95      Manual Therapy   Manual Therapy  Edema management;Soft tissue mobilization;Passive ROM    Manual therapy comments  Manual complete separate than rest of tx    Edema Management  Retrograde massage for edema control with LE elevated             PT Education - 04/23/20 1011    Education Details  advanced HEP    Person(s) Educated  Patient    Methods  Explanation;Handout    Comprehension  Verbalized understanding;Returned demonstration       PT Short Term Goals - 04/23/20 1021  PT SHORT TERM GOAL #1   Title  Patient will be independent in self management strategies to improve quality of life and functional outcomes.    Time  4    Period  Weeks    Status  On-going    Target Date   05/06/20      PT SHORT TERM GOAL #2   Title  Patient will report at least 25% improvement in overall symptoms and/or functional ability.    Time  4    Period  Weeks    Status  On-going    Target Date  05/06/20      PT SHORT TERM GOAL #3   Title  Patient will demonstrate 0-100 degrees of left knee motion.    Time  4    Period  Weeks    Status  On-going    Target Date  05/06/20      PT SHORT TERM GOAL #4   Status  On-going        PT Long Term Goals - 04/23/20 1022      PT LONG TERM GOAL #1   Title  Patient will be able to ambulate at least 226 feet wtih least restrictive device possible in 2 minutes to demonstrate improved functional mobility.    Time  8    Period  Weeks    Status  On-going      PT LONG TERM GOAL #2   Title  Patient will be able to ascend and descend stairs with alternating gait pattern with use of arms as needed to improve community mobility.    Time  8    Period  Weeks    Status  On-going      PT LONG TERM GOAL #3   Title  Patient will report at least 50% improvement in overall symptoms and/or functional ability    Time  8    Period  Weeks    Status  On-going      PT LONG TERM GOAL #4   Status  On-going            Plan - 04/23/20 1012    Clinical Impression Statement  PT treatment focused on decreasing edema and improving functional ROM.  Current ROM    Personal Factors and Comorbidities  Comorbidity 1;Comorbidity 2;Age;Fitness    Comorbidities  left knee scope prior, DB, HTN    Examination-Activity Limitations  Bathing;Bed Mobility;Bend;Caring for Others;Carry;Dressing;Stairs;Squat;Sleep;Sit    Stability/Clinical Decision Making  Stable/Uncomplicated    Rehab Potential  Good    PT Frequency  --   3x/week for 3 weeks then 2x/week for 5 weeks   PT Duration  8 weeks    PT Treatment/Interventions  ADLs/Self Care Home Management;Iontophoresis 4mg /ml Dexamethasone;Gait training;Stair training;Neuromuscular re-education;Dry needling;Joint  Manipulations;Scar mobilization;Passive range of motion;Manual techniques;Functional mobility training;Therapeutic activities;Therapeutic exercise;Balance training;DME Instruction;Electrical Stimulation;Cryotherapy;Biofeedback;Aquatic Therapy;Moist Heat;Traction;Ultrasound;Patient/family education    PT Next Visit Plan  Continue focus with knee ROM (flexion) reducing Swelling and ambulation. PRogress to strengthening when ROM improved. F/U if pt would like a compression garment.  Complete PN for MD appt on Monday.    PT Home Exercise Plan  using cane at home., 4/30:active hamstring stretch, bridge , standing knee flexion       Patient will benefit from skilled therapeutic intervention in order to improve the following deficits and impairments:  Abnormal gait, Decreased range of motion, Difficulty walking, Decreased endurance, Decreased activity tolerance, Decreased skin integrity, Pain, Hypomobility, Decreased scar mobility, Decreased knowledge of use of DME,  Decreased balance, Decreased mobility, Decreased strength, Increased edema  Visit Diagnosis: Difficulty in walking, not elsewhere classified  Chronic pain of left knee  Localized edema  Muscle weakness (generalized)     Problem List Patient Active Problem List   Diagnosis Date Noted  . 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 S/P total hip arthroplasty 10/18/2016  . Hypersomnia with sleep apnea 07/20/2015  . Hypersomnia, persistent 07/20/2015  . OSA (obstructive sleep apnea) 07/20/2015  . Lumbar compression fracture ([place]) 12/21/2014  . Morbid obesity ([place]) 03/07/2013  . Obstructive sleep apnea 03/07/2013  . Fibromyalgia 03/07/2013  . Other and unspecified hyperlipidemia 03/07/2013  . Hx of gastritis 03/07/2013  . S/P cholecystectomy 03/07/2013  . Chest pain 01/07/2013    Rayetta Humphrey, PT CLT [phone] 04/23/2020, 10:44 AM  [facility] [address] [place], [place], [address] Phone: [phone]   Fax:  [phone]  Name: [name] MRN: [id number] Date of Birth: [birth year]

## 2020-04-26 ENCOUNTER — Other Ambulatory Visit: Payer: Self-pay

## 2020-04-26 ENCOUNTER — Encounter (HOSPITAL_COMMUNITY): Payer: Self-pay | Admitting: Physical Therapy

## 2020-04-26 ENCOUNTER — Ambulatory Visit (HOSPITAL_COMMUNITY): Payer: Medicare Other | Attending: Student | Admitting: Physical Therapy

## 2020-04-26 DIAGNOSIS — R262 Difficulty in walking, not elsewhere classified: Secondary | ICD-10-CM | POA: Diagnosis not present

## 2020-04-26 DIAGNOSIS — M25562 Pain in left knee: Secondary | ICD-10-CM | POA: Diagnosis present

## 2020-04-26 DIAGNOSIS — M6281 Muscle weakness (generalized): Secondary | ICD-10-CM | POA: Diagnosis present

## 2020-04-26 DIAGNOSIS — R600 Localized edema: Secondary | ICD-10-CM | POA: Insufficient documentation

## 2020-04-26 DIAGNOSIS — G8929 Other chronic pain: Secondary | ICD-10-CM | POA: Insufficient documentation

## 2020-04-26 NOTE — Therapy (Signed)
[place], [place], [address] Phone: [phone]   Fax:  [phone]  Physical Therapy Treatment  Patient Details  Name: [name] MRN: [id number] Date of Birth: [birth year] Referring Provider (PT): Milagros Evener   Encounter Date: 04/26/2020  PT End of Session - 04/26/20 1412    Visit Number  9    Number of Visits  19    Date for PT Re-Evaluation  06/03/20   POC 4/15-->06/03/20   Authorization Type  UHC Medicare No DED, NO VL NO Auth, AQ included    Progress Note Due on Visit  10    PT Start Time  1400    PT Stop Time  1440    PT Time Calculation (min)  40 min    Activity Tolerance  Patient tolerated treatment well    Behavior During Therapy  [facility] for tasks assessed/performed       Past Medical History:  Diagnosis Date  . Allergy    SEASONAL-flonase daily as well as Claritin  . Anemia    takes Ferrous Sulfate daily  . Anxiety    takes Celexa every evening  . Arthritis    BACK/KNEE  . Cataract    IMMATURE AND ON LEFT EYE SHE THINKS  . Chronic back pain   . Chronic back pain    L1 FRACTURE  . Depression    takes Wellbutrin daily  . Diabetes mellitus without complication ([place])    type II  . Dyspnea    with exertion  . Fibromyalgia   . GERD (gastroesophageal reflux disease)    takes Omeprazole daily and Zantac at bedtime  . H/O hiatal hernia   . History of colon polyps   . History of shingles   . Hyperlipidemia    takes Pravastatin daily as well as Questran  . Hypertension    takes Diovan daily  . Hypothyroidism   . Insomnia    takes trazodone nightly  . Joint pain   . Muscle spasm    takes Robaxin daily as needed  . Nausea    takes Zofran daily as needed  . Nocturia   . Numbness and tingling    LEGS  . Osteoporosis    takes fosamax  . Sleep apnea    uses cpap  . Thyroid disease    takes Synthroid daily    Past Surgical History:  Procedure Laterality Date  . ABDOMINAL  HYSTERECTOMY    . CARDIAC CATHETERIZATION    . CARPAL TUNNEL RELEASE     right and lt  . CHOLECYSTECTOMY    . COLONOSCOPY    . COLONOSCOPY    . EYE SURGERY Bilateral    Cataract with lens  . FOOT SURGERY Bilateral   . FRACTURE SURGERY     rt hand.  no metal  . FRACTURE SURGERY     lr wrist. no metal  . HAND SURGERY Right 06/1985  . HAND SURGERY Left   . JOINT REPLACEMENT Right    THR  . KNEE ARTHROSCOPY Left   . KNEE ARTHROSCOPY Bilateral   . KYPHOPLASTY N/A 12/21/2014   Procedure: KYPHOPLASTY LUMBAR ONE;  Surgeon: Newman Pies, MD;  Location: [facility];  Service: Neurosurgery;  Laterality: N/A;  L1 Kyphoplasty  . ORIF FINGER FRACTURE  04/11/2012   Procedure: OPEN REDUCTION INTERNAL FIXATION (ORIF) METACARPAL (FINGER) FRACTURE;  Surgeon: Ninetta Lights, MD;  Location: [facility];  Service: Orthopedics;  Laterality: Left;  orif left hand 4th metacarpal   . 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 RECTOCELE REPAIR    . SHOULDER ARTHROSCOPY WITH DISTAL CLAVICLE RESECTION Right 05/20/2015   Procedure: SHOULDER ARTHROSCOPY WITH DISTAL CLAVICLE RESECTION DEBRIDEMENT PARTIAL ROTATOR CUFF LABRAL TEAR REMOVAL LOOSE BODIES;  Surgeon: Kathryne Hitch, MD;  Location: [facility];  Service: Orthopedics;  Laterality: Right;  . TONSILLECTOMY    . TOTAL HIP ARTHROPLASTY Right 10/18/2016   Procedure: TOTAL HIP ARTHROPLASTY ANTERIOR APPROACH;  Surgeon: Ninetta Lights, MD;  Location: [facility];  Service: Orthopedics;  Laterality: Right;  . TOTAL KNEE ARTHROPLASTY Left 03/16/2020   Procedure: TOTAL KNEE ARTHROPLASTY;  Surgeon: Corky Mull, MD;  Location: [facility];  Service: Orthopedics;  Laterality: Left;  . UPPER GASTROINTESTINAL ENDOSCOPY  02/2013  . WRIST SURGERY Left     There were no vitals filed for this visit.  Subjective Assessment - 04/26/20 1408    Subjective  States saw the MD and he was pleased with progress. States that she is doing well.  States she has been doing her exercises. States she is not sure about getting compression stockings despite the swelling    Pertinent History  HTN, arthritis, history of left knee scope    Patient Stated Goals  to be able to walk better    Currently in Pain?  Yes    Pain Score  3     Pain Location  Knee    Pain Orientation  Left;Anterior    Pain Descriptors / Indicators  Aching    Pain Onset  1 to 4 weeks ago         [facility] PT Assessment - 04/26/20 0001      Assessment   Medical Diagnosis  L TKA    Referring Provider (PT)  John Poggi    Onset Date/Surgical Date  03/16/20    Next MD Visit  end of June                   [facility] Adult PT Treatment/Exercise - 04/26/20 0001      Knee/Hip Exercises: Stretches   Gastroc Stretch  Both;3 reps;30 seconds    Gastroc Stretch Limitations  slant board    Other Knee/Hip Stretches  knee flexion on step x10 20" holds L     Other Knee/Hip Stretches  thomas stretch 3x60" holds L       Knee/Hip Exercises: Seated   Other Seated Knee/Hip Exercises  contract relax to left knee into flexion with 10 second holds and 2 bouts of isometric holds x5 sets total       Knee/Hip Exercises: Supine   Bridges  AROM;Strengthening;4 sets;5 reps    Knee Extension  AROM    Knee Extension Limitations  7    Knee Flexion  AROM    Knee Flexion Limitations  94      Manual Therapy   Manual Therapy  Joint mobilization    Manual therapy comments  Manual complete separate than rest of tx    Joint Mobilization  patella mobilization L in all directions grade II/II, tib fib mobilizations grade II/III to promote flexion and extension -tolerated well                PT Short Term Goals - 04/23/20 1021      PT SHORT TERM GOAL #1   Title  Patient will be independent in self management strategies to improve quality of life and functional outcomes.  Time  4    Period  Weeks    Status  On-going    Target Date  05/06/20      PT SHORT TERM GOAL #2    Title  Patient will report at least 25% improvement in overall symptoms and/or functional ability.    Time  4    Period  Weeks    Status  On-going    Target Date  05/06/20      PT SHORT TERM GOAL #3   Title  Patient will demonstrate 0-100 degrees of left knee motion.    Time  4    Period  Weeks    Status  On-going    Target Date  05/06/20      PT SHORT TERM GOAL #4   Status  On-going        PT Long Term Goals - 04/23/20 1022      PT LONG TERM GOAL #1   Title  Patient will be able to ambulate at least 226 feet wtih least restrictive device possible in 2 minutes to demonstrate improved functional mobility.    Time  8    Period  Weeks    Status  On-going      PT LONG TERM GOAL #2   Title  Patient will be able to ascend and descend stairs with alternating gait pattern with use of arms as needed to improve community mobility.    Time  8    Period  Weeks    Status  On-going      PT LONG TERM GOAL #3   Title  Patient will report at least 50% improvement in overall symptoms and/or functional ability    Time  8    Period  Weeks    Status  On-going      PT LONG TERM GOAL #4   Status  On-going            Plan - 04/26/20 1456    Clinical Impression Statement  Tolerated session well, continued to demonstrate limitations in  both flexion and extension. Able to achieve greater ROM with contract/relax stretch. Pain noted during end range stretches but this resolved afterwards. Continue to benefit from PT focusing on ROM as well as strengthening within available ROM. Swelling still present but patient not sure about compression stockings, swelling noted in right LE as well.    Personal Factors and Comorbidities  Comorbidity 1;Comorbidity 2;Age;Fitness    Comorbidities  left knee scope prior, DB, HTN    Examination-Activity Limitations  Bathing;Bed Mobility;Bend;Caring for Others;Carry;Dressing;Stairs;Squat;Sleep;Sit    Stability/Clinical Decision Making  Stable/Uncomplicated     Rehab Potential  Good    PT Frequency  --   3x/week for 3 weeks then 2x/week for 5 weeks   PT Duration  8 weeks    PT Treatment/Interventions  ADLs/Self Care Home Management;Iontophoresis 4mg /ml Dexamethasone;Gait training;Stair training;Neuromuscular re-education;Dry needling;Joint Manipulations;Scar mobilization;Passive range of motion;Manual techniques;Functional mobility training;Therapeutic activities;Therapeutic exercise;Balance training;DME Instruction;Electrical Stimulation;Cryotherapy;Biofeedback;Aquatic Therapy;Moist Heat;Traction;Ultrasound;Patient/family education    PT Next Visit Plan  Continue focus with knee ROM (flexion and extension). Continue to work on strengthening within available/end range of motion to maintain gained ROM in session with stretches. Tolerated contract/relax stretches at edge of bed well today.    PT Home Exercise Plan  using cane at home., 4/30:active hamstring stretch, bridge , standing knee flexion; 5/3 thomas stretch, bridges       Patient will benefit from skilled therapeutic intervention in order to improve the following deficits and  impairments:  Abnormal gait, Decreased range of motion, Difficulty walking, Decreased endurance, Decreased activity tolerance, Decreased skin integrity, Pain, Hypomobility, Decreased scar mobility, Decreased knowledge of use of DME, Decreased balance, Decreased mobility, Decreased strength, Increased edema  Visit Diagnosis: Difficulty in walking, not elsewhere classified  Chronic pain of left knee  Localized edema  Muscle weakness (generalized)     Problem List Patient Active Problem List   Diagnosis Date Noted  . 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 S/P total hip arthroplasty 10/18/2016  . Hypersomnia with sleep apnea 07/20/2015  . Hypersomnia, persistent 07/20/2015  . OSA (obstructive sleep apnea) 07/20/2015  . Lumbar compression fracture ([place]) 12/21/2014  . Morbid obesity ([place])  03/07/2013  . Obstructive sleep apnea 03/07/2013  . Fibromyalgia 03/07/2013  . Other and unspecified hyperlipidemia 03/07/2013  . Hx of gastritis 03/07/2013  . S/P cholecystectomy 03/07/2013  . Chest pain 01/07/2013   3:00 PM, 04/26/20 Jerene Pitch, DPT Physical Therapy with [facility]  [phone] office  [facility] [address] [place], [place], [address] Phone: [phone]   Fax:  [phone]  Name: [name] MRN: [id number] Date of Birth: Sep 04, 1946

## 2020-04-28 ENCOUNTER — Encounter (HOSPITAL_COMMUNITY): Payer: Self-pay | Admitting: Physical Therapy

## 2020-04-28 ENCOUNTER — Ambulatory Visit (HOSPITAL_COMMUNITY): Payer: Medicare Other | Admitting: Physical Therapy

## 2020-04-28 ENCOUNTER — Other Ambulatory Visit: Payer: Self-pay

## 2020-04-28 DIAGNOSIS — R262 Difficulty in walking, not elsewhere classified: Secondary | ICD-10-CM

## 2020-04-28 DIAGNOSIS — R600 Localized edema: Secondary | ICD-10-CM

## 2020-04-28 DIAGNOSIS — M6281 Muscle weakness (generalized): Secondary | ICD-10-CM

## 2020-04-28 DIAGNOSIS — G8929 Other chronic pain: Secondary | ICD-10-CM

## 2020-04-28 DIAGNOSIS — M25562 Pain in left knee: Secondary | ICD-10-CM

## 2020-04-28 NOTE — Therapy (Signed)
[facility] [address] [place], [place], [address] Phone: [phone]   Fax:  [phone]  Physical Therapy Treatment and Progress Note  Patient Details  Name: [name] MRN: [id number] Date of Birth: 05-18-1946 Referring Provider (PT): Milagros Evener  Progress Note Reporting Period 04/08/20 to 04/28/20  See note below for Objective Data and Assessment of Progress/Goals.      Encounter Date: 04/28/2020  PT End of Session - 04/28/20 0931    Visit Number  10    Number of Visits  19    Date for PT Re-Evaluation  06/03/20   POC 4/15-->06/03/20   Authorization Type  UHC Medicare No DED, NO VL NO Auth, AQ included    Progress Note Due on Visit  20    PT Start Time  0918    PT Stop Time  0958    PT Time Calculation (min)  40 min    Activity Tolerance  Patient tolerated treatment well    Behavior During Therapy  [facility] for tasks assessed/performed       Past Medical History:  Diagnosis Date  . Allergy    SEASONAL-flonase daily as well as Claritin  . Anemia    takes Ferrous Sulfate daily  . Anxiety    takes Celexa every evening  . Arthritis    BACK/KNEE  . Cataract    IMMATURE AND ON LEFT EYE SHE THINKS  . Chronic back pain   . Chronic back pain    L1 FRACTURE  . Depression    takes Wellbutrin daily  . Diabetes mellitus without complication ([place])    type II  . Dyspnea    with exertion  . Fibromyalgia   . GERD (gastroesophageal reflux disease)    takes Omeprazole daily and Zantac at bedtime  . H/O hiatal hernia   . History of colon polyps   . History of shingles   . Hyperlipidemia    takes Pravastatin daily as well as Questran  . Hypertension    takes Diovan daily  . Hypothyroidism   . Insomnia    takes trazodone nightly  . Joint pain   . Muscle spasm    takes Robaxin daily as needed  . Nausea    takes Zofran daily as needed  . Nocturia   . Numbness and tingling    LEGS  . Osteoporosis    takes fosamax  . Sleep  apnea    uses cpap  . Thyroid disease    takes Synthroid daily    Past Surgical History:  Procedure Laterality Date  . ABDOMINAL HYSTERECTOMY    . CARDIAC CATHETERIZATION    . CARPAL TUNNEL RELEASE     right and lt  . CHOLECYSTECTOMY    . COLONOSCOPY    . COLONOSCOPY    . EYE SURGERY Bilateral    Cataract with lens  . FOOT SURGERY Bilateral   . FRACTURE SURGERY     rt hand.  no metal  . FRACTURE SURGERY     lr wrist. no metal  . HAND SURGERY Right 06/1985  . HAND SURGERY Left   . JOINT REPLACEMENT Right    THR  . KNEE ARTHROSCOPY Left   . KNEE ARTHROSCOPY Bilateral   . KYPHOPLASTY N/A 12/21/2014   Procedure: KYPHOPLASTY LUMBAR ONE;  Surgeon: Newman Pies, MD;  Location: [place] [facility];  Service: Neurosurgery;  Laterality: N/A;  L1 Kyphoplasty  . ORIF FINGER FRACTURE  04/11/2012   Procedure: OPEN REDUCTION  INTERNAL FIXATION (ORIF) METACARPAL (FINGER) FRACTURE;  Surgeon: Ninetta Lights, MD;  Location: [place];  Service: Orthopedics;  Laterality: Left;  orif left hand 4th metacarpal   . 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 RECTOCELE REPAIR    . SHOULDER ARTHROSCOPY WITH DISTAL CLAVICLE RESECTION Right 05/20/2015   Procedure: SHOULDER ARTHROSCOPY WITH DISTAL CLAVICLE RESECTION DEBRIDEMENT PARTIAL ROTATOR CUFF LABRAL TEAR REMOVAL LOOSE BODIES;  Surgeon: Kathryne Hitch, MD;  Location: [place];  Service: Orthopedics;  Laterality: Right;  . TONSILLECTOMY    . TOTAL HIP ARTHROPLASTY Right 10/18/2016   Procedure: TOTAL HIP ARTHROPLASTY ANTERIOR APPROACH;  Surgeon: Ninetta Lights, MD;  Location: [place];  Service: Orthopedics;  Laterality: Right;  . TOTAL KNEE ARTHROPLASTY Left 03/16/2020   Procedure: TOTAL KNEE ARTHROPLASTY;  Surgeon: Corky Mull, MD;  Location: [facility];  Service: Orthopedics;  Laterality: Left;  . UPPER GASTROINTESTINAL ENDOSCOPY  02/2013  . WRIST SURGERY Left     There were no vitals filed for this  visit.  Subjective Assessment - 04/28/20 0923    Subjective  States that yesterday and today her leg has really been bothering her reporting 8/10 pain at worst. Reports he pain is about 5/10 currently. States she has been doing her exercises. States overall she feels about 70% better.    Pertinent History  HTN, arthritis, history of left knee scope    Patient Stated Goals  to be able to walk better    Currently in Pain?  Yes    Pain Score  5     Pain Location  Knee    Pain Orientation  Left;Anterior    Pain Onset  1 to 4 weeks ago         [facility] PT Assessment - 04/28/20 0001      Assessment   Medical Diagnosis  L TKA    Referring Provider (PT)  John Poggi    Onset Date/Surgical Date  03/16/20    Next MD Visit  end of June      Observation/Other Assessments   Skin Integrity  One spot along incision red and painful , small scab, redness is .75cm extending medially and laterally from incision line    Focus on Therapeutic Outcomes (FOTO)   40% limited   was 51% limited     Circumferential Edema   Circumferential - Right  47.5cm at calf   indurated in R    Circumferential - Left   47.0 cm at calf      AROM   Right Knee Extension  0    Right Knee Flexion  115    Left Knee Extension  7    Left Knee Flexion  95                   [facility] Adult PT Treatment/Exercise - 04/28/20 0001      Ambulation/Gait   Ambulation/Gait  Yes    Ambulation/Gait Assistance  6: Modified independent (Device/Increase time)    Assistive device  Small based quad cane    Gait Pattern  Decreased hip/knee flexion - left;Decreased dorsiflexion - left;Decreased arm swing - left;Step-through pattern;Decreased arm swing - right;Trunk flexed    Ambulation Surface  Level;Indoor    Gait velocity  decreased    Gait Comments  2MW - labored breathing,       Knee/Hip Exercises: Stretches   Press photographer  Both;3 reps;30 seconds    Gastroc Stretch Limitations  slant board      Knee/Hip Exercises:  Aerobic   Stationary Bike  5' rocking seat 7   performed during FOTO and subjective     Knee/Hip Exercises: Supine   Knee Extension  AROM    Knee Extension Limitations  7    Knee Flexion  AROM    Knee Flexion Limitations  95    Other Supine Knee/Hip Exercises  LAQs on large bolster - PT assist for end range of motion  2x10 5" holds L       Manual Therapy   Manual Therapy  Edema management    Manual therapy comments  Manual complete separate than rest of tx    Edema Management  Retrograde massage for edema control with LE elevated             PT Education - 04/28/20 0931    Education Details  on FOTO results, current condition/limitations and progress to be made. Discussed typical post op recovery time line; on continuing to elevate legs above heart (was not previously)    Person(s) Educated  Patient    Methods  Explanation    Comprehension  Verbalized understanding       PT Short Term Goals - 04/28/20 0929      PT SHORT TERM GOAL #1   Title  Patient will be independent in self management strategies to improve quality of life and functional outcomes.    Time  4    Period  Weeks    Status  On-going    Target Date  05/06/20      PT SHORT TERM GOAL #2   Title  Patient will report at least 25% improvement in overall symptoms and/or functional ability.    Baseline  5/5 70% better    Time  4    Period  Weeks    Status  Achieved    Target Date  05/06/20      PT SHORT TERM GOAL #3   Title  Patient will demonstrate 0-100 degrees of left knee motion.    Time  4    Period  Weeks    Status  On-going    Target Date  05/06/20      PT SHORT TERM GOAL #4   Status  --        PT Long Term Goals - 04/28/20 0930      PT LONG TERM GOAL #1   Title  Patient will be able to ambulate at least 226 feet wtih least restrictive device possible in 2 minutes to demonstrate improved functional mobility.    Time  8    Period  Weeks    Status  On-going      PT LONG TERM GOAL #2    Title  Patient will be able to ascend and descend stairs with alternating gait pattern with use of arms as needed to improve community mobility.    Time  8    Period  Weeks    Status  On-going      PT LONG TERM GOAL #3   Title  Patient will report at least 50% improvement in overall symptoms and/or functional ability    Baseline  5/5 70% better.    Time  8    Period  Weeks    Status  Achieved      PT LONG TERM GOAL #4   Status  --            Plan - 04/28/20 1133  Clinical Impression Statement  Patient present for a progress note on this date. She has made fair progress in ROM but still is limited in both extension and flexion.  She has met 1/3 short and 1/3 long term goals. Patient with red spot on front of knee, measured and will continue to monitor in upcoming sessions. No swelling noted with measurements but induration noted in left calf. Focused on edema, education and end range strength in left knee today. Patient will continue to benefit from skilled physical therapy to improve functional mobility at home and in the community.    Personal Factors and Comorbidities  Comorbidity 1;Comorbidity 2;Age;Fitness    Comorbidities  left knee scope prior, DB, HTN    Examination-Activity Limitations  Bathing;Bed Mobility;Bend;Caring for Others;Carry;Dressing;Stairs;Squat;Sleep;Sit    Stability/Clinical Decision Making  Stable/Uncomplicated    Rehab Potential  Good    PT Frequency  --   3x/week for 3 weeks then 2x/week for 5 weeks   PT Duration  8 weeks    PT Treatment/Interventions  ADLs/Self Care Home Management;Iontophoresis '4mg'$ /ml Dexamethasone;Gait training;Stair training;Neuromuscular re-education;Dry needling;Joint Manipulations;Scar mobilization;Passive range of motion;Manual techniques;Functional mobility training;Therapeutic activities;Therapeutic exercise;Balance training;DME Instruction;Electrical Stimulation;Cryotherapy;Biofeedback;Aquatic Therapy;Moist  Heat;Traction;Ultrasound;Patient/family education    PT Next Visit Plan  Continue focus with knee ROM (flexion and extension). Continue to work on strengthening within available/end range of motion to maintain gained ROM in session with stretches. Tolerated contract/relax stretches at edge of bed well today.    PT Home Exercise Plan  using cane at home., 4/30:active hamstring stretch, bridge , standing knee flexion; 5/3 thomas stretch, bridges       Patient will benefit from skilled therapeutic intervention in order to improve the following deficits and impairments:  Abnormal gait, Decreased range of motion, Difficulty walking, Decreased endurance, Decreased activity tolerance, Decreased skin integrity, Pain, Hypomobility, Decreased scar mobility, Decreased knowledge of use of DME, Decreased balance, Decreased mobility, Decreased strength, Increased edema  Visit Diagnosis: Difficulty in walking, not elsewhere classified  Chronic pain of left knee  Localized edema  Muscle weakness (generalized)     Problem List Patient Active Problem List   Diagnosis Date Noted  . 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 S/P total hip arthroplasty 10/18/2016  . Hypersomnia with sleep apnea 07/20/2015  . Hypersomnia, persistent 07/20/2015  . OSA (obstructive sleep apnea) 07/20/2015  . Lumbar compression fracture ([place]) 12/21/2014  . Morbid obesity ([place]) 03/07/2013  . Obstructive sleep apnea 03/07/2013  . Fibromyalgia 03/07/2013  . Other and unspecified hyperlipidemia 03/07/2013  . Hx of gastritis 03/07/2013  . S/P cholecystectomy 03/07/2013  . Chest pain 01/07/2013    11:51 AM, 04/28/20 Jerene Pitch, DPT Physical Therapy with [facility]  [phone] office   [place] [address] [place], [place], [address] Phone: [phone]   Fax:  [phone]  Name: LOVELL ROE MRN: [id number] Date of  Birth: Oct 18, 1946

## 2020-04-30 ENCOUNTER — Other Ambulatory Visit: Payer: Self-pay

## 2020-04-30 ENCOUNTER — Ambulatory Visit (HOSPITAL_COMMUNITY): Payer: Medicare Other | Admitting: Physical Therapy

## 2020-04-30 DIAGNOSIS — R262 Difficulty in walking, not elsewhere classified: Secondary | ICD-10-CM

## 2020-04-30 DIAGNOSIS — M6281 Muscle weakness (generalized): Secondary | ICD-10-CM

## 2020-04-30 DIAGNOSIS — R600 Localized edema: Secondary | ICD-10-CM

## 2020-04-30 DIAGNOSIS — M25562 Pain in left knee: Secondary | ICD-10-CM

## 2020-04-30 NOTE — Therapy (Signed)
[place], [place], [address] Phone: [phone]   Fax:  [phone]  Physical Therapy Treatment  Patient Details  Name: [name] MRN: [id number] Date of Birth: 04/16/1946 Referring Provider (PT): Milagros Evener   Encounter Date: 04/30/2020  PT End of Session - 04/30/20 1037    Visit Number  11    Number of Visits  19    Date for PT Re-Evaluation  06/03/20   POC 4/15-->06/03/20   Authorization Type  UHC Medicare No DED, NO VL NO Auth, AQ included    Progress Note Due on Visit  20    PT Start Time  1003    PT Stop Time  1043    PT Time Calculation (min)  40 min    Activity Tolerance  Patient tolerated treatment well    Behavior During Therapy  [facility] for tasks assessed/performed       Past Medical History:  Diagnosis Date  . Allergy    SEASONAL-flonase daily as well as Claritin  . Anemia    takes Ferrous Sulfate daily  . Anxiety    takes Celexa every evening  . Arthritis    BACK/KNEE  . Cataract    IMMATURE AND ON LEFT EYE SHE THINKS  . Chronic back pain   . Chronic back pain    L1 FRACTURE  . Depression    takes Wellbutrin daily  . Diabetes mellitus without complication ([place])    type II  . Dyspnea    with exertion  . Fibromyalgia   . GERD (gastroesophageal reflux disease)    takes Omeprazole daily and Zantac at bedtime  . H/O hiatal hernia   . History of colon polyps   . History of shingles   . Hyperlipidemia    takes Pravastatin daily as well as Questran  . Hypertension    takes Diovan daily  . Hypothyroidism   . Insomnia    takes trazodone nightly  . Joint pain   . Muscle spasm    takes Robaxin daily as needed  . Nausea    takes Zofran daily as needed  . Nocturia   . Numbness and tingling    LEGS  . Osteoporosis    takes fosamax  . Sleep apnea    uses cpap  . Thyroid disease    takes Synthroid daily    Past Surgical History:  Procedure Laterality Date  . ABDOMINAL  HYSTERECTOMY    . CARDIAC CATHETERIZATION    . CARPAL TUNNEL RELEASE     right and lt  . CHOLECYSTECTOMY    . COLONOSCOPY    . COLONOSCOPY    . EYE SURGERY Bilateral    Cataract with lens  . FOOT SURGERY Bilateral   . FRACTURE SURGERY     rt hand.  no metal  . FRACTURE SURGERY     lr wrist. no metal  . HAND SURGERY Right 06/1985  . HAND SURGERY Left   . JOINT REPLACEMENT Right    THR  . KNEE ARTHROSCOPY Left   . KNEE ARTHROSCOPY Bilateral   . KYPHOPLASTY N/A 12/21/2014   Procedure: KYPHOPLASTY LUMBAR ONE;  Surgeon: Newman Pies, MD;  Location: [place] [facility];  Service: Neurosurgery;  Laterality: N/A;  L1 Kyphoplasty  . ORIF FINGER FRACTURE  04/11/2012   Procedure: OPEN REDUCTION INTERNAL FIXATION (ORIF) METACARPAL (FINGER) FRACTURE;  Surgeon: Ninetta Lights, MD;  Location: [place];  Service: Orthopedics;  Laterality: Left;  orif left hand 4th metacarpal   . 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 RECTOCELE REPAIR    . SHOULDER ARTHROSCOPY WITH DISTAL CLAVICLE RESECTION Right 05/20/2015   Procedure: SHOULDER ARTHROSCOPY WITH DISTAL CLAVICLE RESECTION DEBRIDEMENT PARTIAL ROTATOR CUFF LABRAL TEAR REMOVAL LOOSE BODIES;  Surgeon: Kathryne Hitch, MD;  Location: [place];  Service: Orthopedics;  Laterality: Right;  . TONSILLECTOMY    . TOTAL HIP ARTHROPLASTY Right 10/18/2016   Procedure: TOTAL HIP ARTHROPLASTY ANTERIOR APPROACH;  Surgeon: Ninetta Lights, MD;  Location: [place];  Service: Orthopedics;  Laterality: Right;  . TOTAL KNEE ARTHROPLASTY Left 03/16/2020   Procedure: TOTAL KNEE ARTHROPLASTY;  Surgeon: Corky Mull, MD;  Location: [facility];  Service: Orthopedics;  Laterality: Left;  . UPPER GASTROINTESTINAL ENDOSCOPY  02/2013  . WRIST SURGERY Left     There were no vitals filed for this visit.  Subjective Assessment - 04/30/20 1011    Subjective  Pt states that the person that worked with her last kept pushing on her scar trying  to get a stitch out and now the area is red and swollen and she feels that she has gone backwards.  She is icing twice a day    Pertinent History  HTN, arthritis, history of left knee scope    Limitations  Sitting;Lifting;Standing;Walking    Patient Stated Goals  to be able to walk better    Currently in Pain?  Yes    Pain Score  7     Pain Location  Knee    Pain Orientation  Left    Pain Descriptors / Indicators  Tender    Pain Radiating Towards  anterior aspect of her incision    Pain Onset  1 to 4 weeks ago                       [facility] Adult PT Treatment/Exercise - 04/30/20 0001      Knee/Hip Exercises: Seated   Long Arc Quad  10 reps      Knee/Hip Exercises: Supine   Quad Sets  15 reps    Heel Slides  Left    Terminal Knee Extension  Left;10 reps    Knee Extension  AROM    Knee Extension Limitations  8    Knee Flexion  AROM    Knee Flexion Limitations  90    Other Supine Knee/Hip Exercises  area of red on superior aspect of knee 2.5 x 2 cm               PT Short Term Goals - 04/28/20 0929      PT SHORT TERM GOAL #1   Title  Patient will be independent in self management strategies to improve quality of life and functional outcomes.    Time  4    Period  Weeks    Status  On-going    Target Date  05/06/20      PT SHORT TERM GOAL #2   Title  Patient will report at least 25% improvement in overall symptoms and/or functional ability.    Baseline  5/5 70% better    Time  4    Period  Weeks    Status  Achieved    Target Date  05/06/20      PT SHORT TERM GOAL #3   Title  Patient will demonstrate 0-100 degrees of left knee motion.    Time  4  Period  Weeks    Status  On-going    Target Date  05/06/20      PT SHORT TERM GOAL #4   Status  --        PT Long Term Goals - 04/28/20 0930      PT LONG TERM GOAL #1   Title  Patient will be able to ambulate at least 226 feet wtih least restrictive device possible in 2 minutes to demonstrate  improved functional mobility.    Time  8    Period  Weeks    Status  On-going      PT LONG TERM GOAL #2   Title  Patient will be able to ascend and descend stairs with alternating gait pattern with use of arms as needed to improve community mobility.    Time  8    Period  Weeks    Status  On-going      PT LONG TERM GOAL #3   Title  Patient will report at least 50% improvement in overall symptoms and/or functional ability    Baseline  5/5 70% better.    Time  8    Period  Weeks    Status  Achieved      PT LONG TERM GOAL #4   Status  --            Plan - 04/30/20 1037    Clinical Impression Statement  Pt feels that last session caused her to to go backwards, states the therapist was digging trying to get a stitch out and she has had increased pain and swelling ever since, states that the red spot got larger after the session.   Therapist explained that sometimes stitches can get infected and that is why the therapist was attempting to remove the stitch.  Pt treatment focused on decreasing edema and ROM only.   Pt pain down from a 7 to a 3 following treatment.    Personal Factors and Comorbidities  Comorbidity 1;Comorbidity 2;Age;Fitness    Comorbidities  left knee scope prior, DB, HTN    Examination-Activity Limitations  Bathing;Bed Mobility;Bend;Caring for Others;Carry;Dressing;Stairs;Squat;Sleep;Sit    Stability/Clinical Decision Making  Stable/Uncomplicated    Rehab Potential  Good    PT Frequency  --   3x/week for 3 weeks then 2x/week for 5 weeks   PT Duration  8 weeks    PT Treatment/Interventions  ADLs/Self Care Home Management;Iontophoresis 4mg /ml Dexamethasone;Gait training;Stair training;Neuromuscular re-education;Dry needling;Joint Manipulations;Scar mobilization;Passive range of motion;Manual techniques;Functional mobility training;Therapeutic activities;Therapeutic exercise;Balance training;DME Instruction;Electrical Stimulation;Cryotherapy;Biofeedback;Aquatic  Therapy;Moist Heat;Traction;Ultrasound;Patient/family education    PT Next Visit Plan  continue with edema control and focusing on ROM    PT Home Exercise Plan  using cane at home., 4/30:active hamstring stretch, bridge , standing knee flexion; 5/3 thomas stretch, bridges       Patient will benefit from skilled therapeutic intervention in order to improve the following deficits and impairments:  Abnormal gait, Decreased range of motion, Difficulty walking, Decreased endurance, Decreased activity tolerance, Decreased skin integrity, Pain, Hypomobility, Decreased scar mobility, Decreased knowledge of use of DME, Decreased balance, Decreased mobility, Decreased strength, Increased edema  Visit Diagnosis: Difficulty in walking, not elsewhere classified  Chronic pain of left knee  Localized edema  Muscle weakness (generalized)     Problem List Patient Active Problem List   Diagnosis Date Noted  . 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 S/P total hip arthroplasty 10/18/2016  . Hypersomnia with sleep apnea 07/20/2015  .  Hypersomnia, persistent 07/20/2015  . OSA (obstructive sleep apnea) 07/20/2015  . Lumbar compression fracture ([place]) 12/21/2014  . Morbid obesity ([place]) 03/07/2013  . Obstructive sleep apnea 03/07/2013  . Fibromyalgia 03/07/2013  . Other and unspecified hyperlipidemia 03/07/2013  . Hx of gastritis 03/07/2013  . S/P cholecystectomy 03/07/2013  . Chest pain 01/07/2013    Rayetta Humphrey, PT CLT [phone] 04/30/2020, 10:49 AM  [place] [address] [place], [place], [address] Phone: [phone]   Fax:  [phone]  Name: DAYLEN SHERK MRN: [id number] Date of Birth: Feb 12, 1946

## 2020-05-04 ENCOUNTER — Ambulatory Visit (HOSPITAL_COMMUNITY): Payer: Medicare Other | Admitting: Physical Therapy

## 2020-05-05 ENCOUNTER — Telehealth (HOSPITAL_COMMUNITY): Payer: Self-pay | Admitting: Physical Therapy

## 2020-05-05 NOTE — Telephone Encounter (Signed)
pt cancelled appt for 5/14 because she is going to the MD for infection in her leg

## 2020-05-06 ENCOUNTER — Ambulatory Visit (HOSPITAL_COMMUNITY): Payer: Medicare Other

## 2020-05-07 ENCOUNTER — Ambulatory Visit (HOSPITAL_COMMUNITY): Payer: Medicare Other | Admitting: Physical Therapy

## 2020-05-11 ENCOUNTER — Other Ambulatory Visit: Payer: Self-pay

## 2020-05-11 ENCOUNTER — Ambulatory Visit (HOSPITAL_COMMUNITY): Payer: Medicare Other | Admitting: Physical Therapy

## 2020-05-11 ENCOUNTER — Encounter (HOSPITAL_COMMUNITY): Payer: Self-pay | Admitting: Physical Therapy

## 2020-05-11 DIAGNOSIS — G8929 Other chronic pain: Secondary | ICD-10-CM

## 2020-05-11 DIAGNOSIS — R262 Difficulty in walking, not elsewhere classified: Secondary | ICD-10-CM

## 2020-05-11 DIAGNOSIS — R600 Localized edema: Secondary | ICD-10-CM

## 2020-05-11 DIAGNOSIS — M25562 Pain in left knee: Secondary | ICD-10-CM

## 2020-05-11 DIAGNOSIS — M6281 Muscle weakness (generalized): Secondary | ICD-10-CM

## 2020-05-11 NOTE — Therapy (Signed)
[place], [place], [address] Phone: [phone]   Fax:  [phone]  Physical Therapy Treatment  Patient Details  Name: [name] MRN: [id number] Date of Birth: 02/18/1946 Referring Provider (PT): Milagros Evener   Encounter Date: 05/11/2020  PT End of Session - 05/11/20 1126    Visit Number  12    Number of Visits  19    Date for PT Re-Evaluation  06/03/20   POC 4/15-->06/03/20   Authorization Type  UHC Medicare No DED, NO VL NO Auth, AQ included    Progress Note Due on Visit  20    PT Start Time  1127    PT Stop Time  1207    PT Time Calculation (min)  40 min    Activity Tolerance  Patient tolerated treatment well    Behavior During Therapy  [facility] for tasks assessed/performed       Past Medical History:  Diagnosis Date  . Allergy    SEASONAL-flonase daily as well as Claritin  . Anemia    takes Ferrous Sulfate daily  . Anxiety    takes Celexa every evening  . Arthritis    BACK/KNEE  . Cataract    IMMATURE AND ON LEFT EYE SHE THINKS  . Chronic back pain   . Chronic back pain    L1 FRACTURE  . Depression    takes Wellbutrin daily  . Diabetes mellitus without complication ([place])    type II  . Dyspnea    with exertion  . Fibromyalgia   . GERD (gastroesophageal reflux disease)    takes Omeprazole daily and Zantac at bedtime  . H/O hiatal hernia   . History of colon polyps   . History of shingles   . Hyperlipidemia    takes Pravastatin daily as well as Questran  . Hypertension    takes Diovan daily  . Hypothyroidism   . Insomnia    takes trazodone nightly  . Joint pain   . Muscle spasm    takes Robaxin daily as needed  . Nausea    takes Zofran daily as needed  . Nocturia   . Numbness and tingling    LEGS  . Osteoporosis    takes fosamax  . Sleep apnea    uses cpap  . Thyroid disease    takes Synthroid daily    Past Surgical History:  Procedure Laterality Date  . ABDOMINAL  HYSTERECTOMY    . CARDIAC CATHETERIZATION    . CARPAL TUNNEL RELEASE     right and lt  . CHOLECYSTECTOMY    . COLONOSCOPY    . COLONOSCOPY    . EYE SURGERY Bilateral    Cataract with lens  . FOOT SURGERY Bilateral   . FRACTURE SURGERY     rt hand.  no metal  . FRACTURE SURGERY     lr wrist. no metal  . HAND SURGERY Right 06/1985  . HAND SURGERY Left   . JOINT REPLACEMENT Right    THR  . KNEE ARTHROSCOPY Left   . KNEE ARTHROSCOPY Bilateral   . KYPHOPLASTY N/A 12/21/2014   Procedure: KYPHOPLASTY LUMBAR ONE;  Surgeon: Newman Pies, MD;  Location: [place] [facility];  Service: Neurosurgery;  Laterality: N/A;  L1 Kyphoplasty  . ORIF FINGER FRACTURE  04/11/2012   Procedure: OPEN REDUCTION INTERNAL FIXATION (ORIF) METACARPAL (FINGER) FRACTURE;  Surgeon: Ninetta Lights, MD;  Location: [place];  Service: Orthopedics;  Laterality: Left;  orif left hand 4th metacarpal   . 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 RECTOCELE REPAIR    . SHOULDER ARTHROSCOPY WITH DISTAL CLAVICLE RESECTION Right 05/20/2015   Procedure: SHOULDER ARTHROSCOPY WITH DISTAL CLAVICLE RESECTION DEBRIDEMENT PARTIAL ROTATOR CUFF LABRAL TEAR REMOVAL LOOSE BODIES;  Surgeon: Kathryne Hitch, MD;  Location: [place];  Service: Orthopedics;  Laterality: Right;  . TONSILLECTOMY    . TOTAL HIP ARTHROPLASTY Right 10/18/2016   Procedure: TOTAL HIP ARTHROPLASTY ANTERIOR APPROACH;  Surgeon: Ninetta Lights, MD;  Location: [place];  Service: Orthopedics;  Laterality: Right;  . TOTAL KNEE ARTHROPLASTY Left 03/16/2020   Procedure: TOTAL KNEE ARTHROPLASTY;  Surgeon: Corky Mull, MD;  Location: [facility];  Service: Orthopedics;  Laterality: Left;  . UPPER GASTROINTESTINAL ENDOSCOPY  02/2013  . WRIST SURGERY Left     There were no vitals filed for this visit.  Subjective Assessment - 05/11/20 1323    Subjective  Patient reports she went to the MD and that they removed 3 stitches. Reports she is  on 2 weeks of antibiotics. States that she feels it is better but that it really swelled up after that other session. States she drove for the first time today.    Pertinent History  HTN, arthritis, history of left knee scope    Limitations  Sitting;Lifting;Standing;Walking    Patient Stated Goals  to be able to walk better    Currently in Pain?  Yes    Pain Orientation  Left    Pain Descriptors / Indicators  Aching;Tightness    Pain Type  Surgical pain    Pain Onset  1 to 4 weeks ago         [facility] PT Assessment - 05/11/20 0001      Assessment   Medical Diagnosis  L TKA    Referring Provider (PT)  John Poggi    Onset Date/Surgical Date  03/16/20    Hand Dominance  Right    Next MD Visit  05/18/20                    [facility] Adult PT Treatment/Exercise - 05/11/20 0001      Knee/Hip Exercises: Aerobic   Nustep  seat position 4 5 minutes with holds at end range for stretches      Knee/Hip Exercises: Standing   Other Standing Knee Exercises  --      Knee/Hip Exercises: Seated   Other Seated Knee/Hip Exercises  practiced self contract/relax stretch     Other Seated Knee/Hip Exercises  contract relax to left knee into flexion with 10 second holds and 4 bouts of isometric holds x5 sets total       Knee/Hip Exercises: Supine   Knee Extension  AROM    Knee Extension Limitations  10    Knee Flexion  AROM    Knee Flexion Limitations  97    Other Supine Knee/Hip Exercises  heel slides with overpressure 8  minutes total             PT Education - 05/11/20 1326    Education Details  reassured patient and answered all questions in regards to knee pain (concerns as knee pain has been more constant). Discussed continued incision care and to not submerge knee in water (wanted to get into pool) secondary to open incision (scabs still presents). Discussed continued elevation secondary to continued swelling in leg noted.    Person(s) Educated  Patient  Methods  Explanation     Comprehension  Verbalized understanding       PT Short Term Goals - 04/28/20 0929      PT SHORT TERM GOAL #1   Title  Patient will be independent in self management strategies to improve quality of life and functional outcomes.    Time  4    Period  Weeks    Status  On-going    Target Date  05/06/20      PT SHORT TERM GOAL #2   Title  Patient will report at least 25% improvement in overall symptoms and/or functional ability.    Baseline  5/5 70% better    Time  4    Period  Weeks    Status  Achieved    Target Date  05/06/20      PT SHORT TERM GOAL #3   Title  Patient will demonstrate 0-100 degrees of left knee motion.    Time  4    Period  Weeks    Status  On-going    Target Date  05/06/20      PT SHORT TERM GOAL #4   Status  --        PT Long Term Goals - 04/28/20 0930      PT LONG TERM GOAL #1   Title  Patient will be able to ambulate at least 226 feet wtih least restrictive device possible in 2 minutes to demonstrate improved functional mobility.    Time  8    Period  Weeks    Status  On-going      PT LONG TERM GOAL #2   Title  Patient will be able to ascend and descend stairs with alternating gait pattern with use of arms as needed to improve community mobility.    Time  8    Period  Weeks    Status  On-going      PT LONG TERM GOAL #3   Title  Patient will report at least 50% improvement in overall symptoms and/or functional ability    Baseline  5/5 70% better.    Time  8    Period  Weeks    Status  Achieved      PT LONG TERM GOAL #4   Status  --            Plan - 05/11/20 1705    Clinical Impression Statement  Session focused on education secondary to patient continued concerned bout knee and infection. Discussed with patient PT's initial concern about knee incision site with initial inspection after recent bandage removal which was why measurements were taken. Reassured patient that with antibiotics and MD visits no other steps need to be  taken at this time. Educated patient in proper incision care and not submerging knee at this time (patient inquired about going to pool). Answered all questions and focused on knee ROM during today's session.    Personal Factors and Comorbidities  Comorbidity 1;Comorbidity 2;Age;Fitness    Comorbidities  left knee scope prior, DB, HTN    Examination-Activity Limitations  Bathing;Bed Mobility;Bend;Caring for Others;Carry;Dressing;Stairs;Squat;Sleep;Sit    Stability/Clinical Decision Making  Stable/Uncomplicated    Rehab Potential  Good    PT Frequency  --   3x/week for 3 weeks then 2x/week for 5 weeks   PT Duration  8 weeks    PT Treatment/Interventions  ADLs/Self Care Home Management;Iontophoresis 4mg /ml Dexamethasone;Gait training;Stair training;Neuromuscular re-education;Dry needling;Joint Manipulations;Scar mobilization;Passive range of motion;Manual techniques;Functional mobility training;Therapeutic activities;Therapeutic exercise;Balance training;DME  Instruction;Electrical Stimulation;Cryotherapy;Biofeedback;Aquatic Therapy;Moist Heat;Traction;Ultrasound;Patient/family education    PT Next Visit Plan  continue with edema control and focusing on ROM    PT Home Exercise Plan  using cane at home., 4/30:active hamstring stretch, bridge , standing knee flexion; 5/3 thomas stretch, bridges; 5/18 contract/relax at EOB       Patient will benefit from skilled therapeutic intervention in order to improve the following deficits and impairments:  Abnormal gait, Decreased range of motion, Difficulty walking, Decreased endurance, Decreased activity tolerance, Decreased skin integrity, Pain, Hypomobility, Decreased scar mobility, Decreased knowledge of use of DME, Decreased balance, Decreased mobility, Decreased strength, Increased edema  Visit Diagnosis: Difficulty in walking, not elsewhere classified  Chronic pain of left knee  Localized edema  Muscle weakness (generalized)     Problem  List Patient Active Problem List   Diagnosis Date Noted  . 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 S/P total hip arthroplasty 10/18/2016  . Hypersomnia with sleep apnea 07/20/2015  . Hypersomnia, persistent 07/20/2015  . OSA (obstructive sleep apnea) 07/20/2015  . Lumbar compression fracture ([place]) 12/21/2014  . Morbid obesity ([place]) 03/07/2013  . Obstructive sleep apnea 03/07/2013  . Fibromyalgia 03/07/2013  . Other and unspecified hyperlipidemia 03/07/2013  . Hx of gastritis 03/07/2013  . S/P cholecystectomy 03/07/2013  . Chest pain 01/07/2013   5:09 PM, 05/11/20 Jerene Pitch, DPT Physical Therapy with [facility]  [phone] office  [place] [address] [place], [place], [address] Phone: [phone]   Fax:  [phone]  Name: [name] MRN: [id number] Date of Birth: October 05, 1946

## 2020-05-13 ENCOUNTER — Other Ambulatory Visit: Payer: Self-pay

## 2020-05-13 ENCOUNTER — Ambulatory Visit (HOSPITAL_COMMUNITY): Payer: Medicare Other

## 2020-05-13 ENCOUNTER — Encounter (HOSPITAL_COMMUNITY): Payer: Self-pay

## 2020-05-13 DIAGNOSIS — G8929 Other chronic pain: Secondary | ICD-10-CM

## 2020-05-13 DIAGNOSIS — R262 Difficulty in walking, not elsewhere classified: Secondary | ICD-10-CM | POA: Diagnosis not present

## 2020-05-13 DIAGNOSIS — R600 Localized edema: Secondary | ICD-10-CM

## 2020-05-13 DIAGNOSIS — M6281 Muscle weakness (generalized): Secondary | ICD-10-CM

## 2020-05-13 NOTE — Therapy (Signed)
[place], [place], [address] Phone: [phone]   Fax:  [phone]  Physical Therapy Treatment  Patient Details  Name: [name] MRN: [id number] Date of Birth: Apr 30, 1946 Referring Provider (PT): Milagros Evener   Encounter Date: 05/13/2020  PT End of Session - 05/13/20 1033    Visit Number  13    Number of Visits  19    Date for PT Re-Evaluation  06/03/20   POC 4/15-->06/03/20   Authorization Type  UHC Medicare No DED, NO VL NO Auth, AQ included    Progress Note Due on Visit  20    PT Start Time  1003   4' on Nustep, not included wiht charges   PT Stop Time  1045    PT Time Calculation (min)  42 min    Activity Tolerance  Patient tolerated treatment well    Behavior During Therapy  [facility] for tasks assessed/performed       Past Medical History:  Diagnosis Date  . Allergy    SEASONAL-flonase daily as well as Claritin  . Anemia    takes Ferrous Sulfate daily  . Anxiety    takes Celexa every evening  . Arthritis    BACK/KNEE  . Cataract    IMMATURE AND ON LEFT EYE SHE THINKS  . Chronic back pain   . Chronic back pain    L1 FRACTURE  . Depression    takes Wellbutrin daily  . Diabetes mellitus without complication ([place])    type II  . Dyspnea    with exertion  . Fibromyalgia   . GERD (gastroesophageal reflux disease)    takes Omeprazole daily and Zantac at bedtime  . H/O hiatal hernia   . History of colon polyps   . History of shingles   . Hyperlipidemia    takes Pravastatin daily as well as Questran  . Hypertension    takes Diovan daily  . Hypothyroidism   . Insomnia    takes trazodone nightly  . Joint pain   . Muscle spasm    takes Robaxin daily as needed  . Nausea    takes Zofran daily as needed  . Nocturia   . Numbness and tingling    LEGS  . Osteoporosis    takes fosamax  . Sleep apnea    uses cpap  . Thyroid disease    takes Synthroid daily    Past Surgical History:   Procedure Laterality Date  . ABDOMINAL HYSTERECTOMY    . CARDIAC CATHETERIZATION    . CARPAL TUNNEL RELEASE     right and lt  . CHOLECYSTECTOMY    . COLONOSCOPY    . COLONOSCOPY    . EYE SURGERY Bilateral    Cataract with lens  . FOOT SURGERY Bilateral   . FRACTURE SURGERY     rt hand.  no metal  . FRACTURE SURGERY     lr wrist. no metal  . HAND SURGERY Right 06/1985  . HAND SURGERY Left   . JOINT REPLACEMENT Right    THR  . KNEE ARTHROSCOPY Left   . KNEE ARTHROSCOPY Bilateral   . KYPHOPLASTY N/A 12/21/2014   Procedure: KYPHOPLASTY LUMBAR ONE;  Surgeon: Newman Pies, MD;  Location: [place] [facility];  Service: Neurosurgery;  Laterality: N/A;  L1 Kyphoplasty  . ORIF FINGER FRACTURE  04/11/2012   Procedure: OPEN REDUCTION INTERNAL FIXATION (ORIF) METACARPAL (FINGER) FRACTURE;  Surgeon: Ninetta Lights, MD;  Location: [facility];  Service: Orthopedics;  Laterality: Left;  orif left hand 4th metacarpal   . 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 RECTOCELE REPAIR    . SHOULDER ARTHROSCOPY WITH DISTAL CLAVICLE RESECTION Right 05/20/2015   Procedure: SHOULDER ARTHROSCOPY WITH DISTAL CLAVICLE RESECTION DEBRIDEMENT PARTIAL ROTATOR CUFF LABRAL TEAR REMOVAL LOOSE BODIES;  Surgeon: Kathryne Hitch, MD;  Location: [place];  Service: Orthopedics;  Laterality: Right;  . TONSILLECTOMY    . TOTAL HIP ARTHROPLASTY Right 10/18/2016   Procedure: TOTAL HIP ARTHROPLASTY ANTERIOR APPROACH;  Surgeon: Ninetta Lights, MD;  Location: [place];  Service: Orthopedics;  Laterality: Right;  . TOTAL KNEE ARTHROPLASTY Left 03/16/2020   Procedure: TOTAL KNEE ARTHROPLASTY;  Surgeon: Corky Mull, MD;  Location: [facility];  Service: Orthopedics;  Laterality: Left;  . UPPER GASTROINTESTINAL ENDOSCOPY  02/2013  . WRIST SURGERY Left     There were no vitals filed for this visit.  Subjective Assessment - 05/13/20 1011    Subjective  Pt stated MD removed stitches and told her  to prepare for some bandages and to keep covered.  Reports she has been compliant wiht HEP daily and taking antibiotics.  Pt stated decreased redness and overall pain.  Reports improvesment wiht the flexion, ambulates around the house without AD.    Pertinent History  HTN, arthritis, history of left knee scope    Patient Stated Goals  to be able to walk better    Currently in Pain?  Yes    Pain Score  2     Pain Location  Knee    Pain Orientation  Right    Pain Descriptors / Indicators  Aching;Tightness    Pain Type  Surgical pain    Pain Radiating Towards  anterior aspect of her incision    Pain Onset  1 to 4 weeks ago    Pain Frequency  Constant    Aggravating Factors   bending    Pain Relieving Factors  icing                        [facility] Adult PT Treatment/Exercise - 05/13/20 0001      Knee/Hip Exercises: Aerobic   Nustep  seat position 4 4 minutes with holds at end range for stretches      Knee/Hip Exercises: Seated   Long Arc Quad  10 reps    Heel Slides  AAROM;10 reps;AROM   AROM to end range then Rt LE assist   Other Seated Knee/Hip Exercises  practiced self contract/relax stretch     Other Seated Knee/Hip Exercises  contract relax to left knee into flexion with 10 second holds and 4 bouts of isometric holds x5 sets total       Knee/Hip Exercises: Supine   Short Arc Quad Sets  10 reps;Left    Terminal Knee Extension  Left;10 reps    Terminal Knee Extension Limitations  2 sets, 5" holds    Bridges  Both;2 sets;10 reps    Knee Extension  AROM    Knee Extension Limitations  8    Knee Flexion  AROM    Knee Flexion Limitations  97    Other Supine Knee/Hip Exercises  heel slides with overpressure 8  minutes total               PT Short Term Goals - 04/28/20 0929      PT SHORT TERM GOAL #1   Title  Patient will be independent in self management strategies to improve quality of life and functional outcomes.    Time  4    Period  Weeks    Status   On-going    Target Date  05/06/20      PT SHORT TERM GOAL #2   Title  Patient will report at least 25% improvement in overall symptoms and/or functional ability.    Baseline  5/5 70% better    Time  4    Period  Weeks    Status  Achieved    Target Date  05/06/20      PT SHORT TERM GOAL #3   Title  Patient will demonstrate 0-100 degrees of left knee motion.    Time  4    Period  Weeks    Status  On-going    Target Date  05/06/20      PT SHORT TERM GOAL #4   Status  --        PT Long Term Goals - 04/28/20 0930      PT LONG TERM GOAL #1   Title  Patient will be able to ambulate at least 226 feet wtih least restrictive device possible in 2 minutes to demonstrate improved functional mobility.    Time  8    Period  Weeks    Status  On-going      PT LONG TERM GOAL #2   Title  Patient will be able to ascend and descend stairs with alternating gait pattern with use of arms as needed to improve community mobility.    Time  8    Period  Weeks    Status  On-going      PT LONG TERM GOAL #3   Title  Patient will report at least 50% improvement in overall symptoms and/or functional ability    Baseline  5/5 70% better.    Time  8    Period  Weeks    Status  Achieved      PT LONG TERM GOAL #4   Status  --            Plan - 05/13/20 1023    Clinical Impression Statement  Pt presents with decreased pain and redness reduced <1cm around incision/scab, reviewed proper incision care and encouraged bandaid if drainage and resume ice for pain and edema control.  Session focus with ROM based    Personal Factors and Comorbidities  Comorbidity 1;Comorbidity 2;Age;Fitness    Comorbidities  left knee scope prior, DB, HTN    Examination-Activity Limitations  Bathing;Bed Mobility;Bend;Caring for Others;Carry;Dressing;Stairs;Squat;Sleep;Sit    Stability/Clinical Decision Making  Stable/Uncomplicated    Clinical Decision Making  Low    Rehab Potential  Good    PT Frequency  --   3x/week  for 3 weeks then 2x/week for 5 weeks   PT Duration  8 weeks    PT Treatment/Interventions  ADLs/Self Care Home Management;Iontophoresis 4mg /ml Dexamethasone;Gait training;Stair training;Neuromuscular re-education;Dry needling;Joint Manipulations;Scar mobilization;Passive range of motion;Manual techniques;Functional mobility training;Therapeutic activities;Therapeutic exercise;Balance training;DME Instruction;Electrical Stimulation;Cryotherapy;Biofeedback;Aquatic Therapy;Moist Heat;Traction;Ultrasound;Patient/family education    PT Next Visit Plan  continue with edema control and focusing on ROM    PT Home Exercise Plan  using cane at home., 4/30:active hamstring stretch, bridge , standing knee flexion; 5/3 thomas stretch, bridges; 5/18 contract/relax at EOB       Patient will benefit from skilled therapeutic intervention in order to improve the following deficits and impairments:  Abnormal gait, Decreased range of motion, Difficulty  walking, Decreased endurance, Decreased activity tolerance, Decreased skin integrity, Pain, Hypomobility, Decreased scar mobility, Decreased knowledge of use of DME, Decreased balance, Decreased mobility, Decreased strength, Increased edema  Visit Diagnosis: Difficulty in walking, not elsewhere classified  Chronic pain of left knee  Localized edema  Muscle weakness (generalized)     Problem List Patient Active Problem List   Diagnosis Date Noted  . 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 S/P total hip arthroplasty 10/18/2016  . Hypersomnia with sleep apnea 07/20/2015  . Hypersomnia, persistent 07/20/2015  . OSA (obstructive sleep apnea) 07/20/2015  . Lumbar compression fracture ([place]) 12/21/2014  . Morbid obesity ([place]) 03/07/2013  . Obstructive sleep apnea 03/07/2013  . Fibromyalgia 03/07/2013  . Other and unspecified hyperlipidemia 03/07/2013  . Hx of gastritis 03/07/2013  . S/P cholecystectomy 03/07/2013  . Chest pain  01/07/2013   Ihor Austin, LPTA/CLT; CBIS [phone]  Aldona Lento 05/13/2020, 10:43 AM  [place], [place], [address] Phone: [phone]   Fax:  [phone]  Name: JANNETE GOLOB MRN: [id number] Date of Birth: 1946-01-07

## 2020-05-18 ENCOUNTER — Ambulatory Visit (HOSPITAL_COMMUNITY): Payer: Self-pay | Admitting: Physical Therapy

## 2020-05-18 ENCOUNTER — Telehealth (HOSPITAL_COMMUNITY): Payer: Self-pay | Admitting: Physical Therapy

## 2020-05-18 NOTE — Telephone Encounter (Signed)
pt cancelled appt for today because she woke up with diarrhea

## 2020-05-20 ENCOUNTER — Other Ambulatory Visit: Payer: Self-pay

## 2020-05-20 ENCOUNTER — Ambulatory Visit (HOSPITAL_COMMUNITY): Payer: Medicare Other | Admitting: Physical Therapy

## 2020-05-20 ENCOUNTER — Encounter (HOSPITAL_COMMUNITY): Payer: Self-pay | Admitting: Physical Therapy

## 2020-05-20 DIAGNOSIS — M6281 Muscle weakness (generalized): Secondary | ICD-10-CM

## 2020-05-20 DIAGNOSIS — R600 Localized edema: Secondary | ICD-10-CM

## 2020-05-20 DIAGNOSIS — R262 Difficulty in walking, not elsewhere classified: Secondary | ICD-10-CM

## 2020-05-20 DIAGNOSIS — M25562 Pain in left knee: Secondary | ICD-10-CM

## 2020-05-20 DIAGNOSIS — G8929 Other chronic pain: Secondary | ICD-10-CM

## 2020-05-20 NOTE — Therapy (Signed)
[place], [place], [address] Phone: [phone]   Fax:  [phone]  Physical Therapy Treatment  Patient Details  Name: MCKINSLEY GONCE MRN: [id number] Date of Birth: 17-Feb-1946 Referring Provider (PT): Milagros Evener   Encounter Date: 05/20/2020  PT End of Session - 05/20/20 1420    Visit Number  15    Number of Visits  19    Date for PT Re-Evaluation  06/03/20   POC 4/15-->06/03/20   Authorization Type  UHC Medicare No DED, NO VL NO Auth, AQ included    Progress Note Due on Visit  20    PT Start Time  1130    PT Stop Time  1210    PT Time Calculation (min)  40 min    Activity Tolerance  Patient tolerated treatment well    Behavior During Therapy  [facility] for tasks assessed/performed       Past Medical History:  Diagnosis Date  . Allergy    SEASONAL-flonase daily as well as Claritin  . Anemia    takes Ferrous Sulfate daily  . Anxiety    takes Celexa every evening  . Arthritis    BACK/KNEE  . Cataract    IMMATURE AND ON LEFT EYE SHE THINKS  . Chronic back pain   . Chronic back pain    L1 FRACTURE  . Depression    takes Wellbutrin daily  . Diabetes mellitus without complication ([place])    type II  . Dyspnea    with exertion  . Fibromyalgia   . GERD (gastroesophageal reflux disease)    takes Omeprazole daily and Zantac at bedtime  . H/O hiatal hernia   . History of colon polyps   . History of shingles   . Hyperlipidemia    takes Pravastatin daily as well as Questran  . Hypertension    takes Diovan daily  . Hypothyroidism   . Insomnia    takes trazodone nightly  . Joint pain   . Muscle spasm    takes Robaxin daily as needed  . Nausea    takes Zofran daily as needed  . Nocturia   . Numbness and tingling    LEGS  . Osteoporosis    takes fosamax  . Sleep apnea    uses cpap  . Thyroid disease    takes Synthroid daily    Past Surgical History:  Procedure Laterality Date  . ABDOMINAL  HYSTERECTOMY    . CARDIAC CATHETERIZATION    . CARPAL TUNNEL RELEASE     right and lt  . CHOLECYSTECTOMY    . COLONOSCOPY    . COLONOSCOPY    . EYE SURGERY Bilateral    Cataract with lens  . FOOT SURGERY Bilateral   . FRACTURE SURGERY     rt hand.  no metal  . FRACTURE SURGERY     lr wrist. no metal  . HAND SURGERY Right 06/1985  . HAND SURGERY Left   . JOINT REPLACEMENT Right    THR  . KNEE ARTHROSCOPY Left   . KNEE ARTHROSCOPY Bilateral   . KYPHOPLASTY N/A 12/21/2014   Procedure: KYPHOPLASTY LUMBAR ONE;  Surgeon: Newman Pies, MD;  Location: [place] [facility];  Service: Neurosurgery;  Laterality: N/A;  L1 Kyphoplasty  . ORIF FINGER FRACTURE  04/11/2012   Procedure: OPEN REDUCTION INTERNAL FIXATION (ORIF) METACARPAL (FINGER) FRACTURE;  Surgeon: Ninetta Lights, MD;  Location: [place];  Service: Orthopedics;  Laterality: Left;  orif left hand 4th metacarpal   . 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 RECTOCELE REPAIR    . SHOULDER ARTHROSCOPY WITH DISTAL CLAVICLE RESECTION Right 05/20/2015   Procedure: SHOULDER ARTHROSCOPY WITH DISTAL CLAVICLE RESECTION DEBRIDEMENT PARTIAL ROTATOR CUFF LABRAL TEAR REMOVAL LOOSE BODIES;  Surgeon: Kathryne Hitch, MD;  Location: [place];  Service: Orthopedics;  Laterality: Right;  . TONSILLECTOMY    . TOTAL HIP ARTHROPLASTY Right 10/18/2016   Procedure: TOTAL HIP ARTHROPLASTY ANTERIOR APPROACH;  Surgeon: Ninetta Lights, MD;  Location: [place];  Service: Orthopedics;  Laterality: Right;  . TOTAL KNEE ARTHROPLASTY Left 03/16/2020   Procedure: TOTAL KNEE ARTHROPLASTY;  Surgeon: Corky Mull, MD;  Location: [facility];  Service: Orthopedics;  Laterality: Left;  . UPPER GASTROINTESTINAL ENDOSCOPY  02/2013  . WRIST SURGERY Left     There were no vitals filed for this visit.  Subjective Assessment - 05/20/20 1418    Subjective  Patient reports she saw the MD and he wants her continue therapy for next week so she  is here next week. STates she has been doing her exercises. Reports her incision is healing well but there is a scab at the top, reports she was hoping it would have fallen off by now.    Pertinent History  HTN, arthritis, history of left knee scope    Patient Stated Goals  to be able to walk better    Currently in Pain?  Yes    Pain Score  2     Pain Location  Knee    Pain Orientation  Right    Pain Descriptors / Indicators  Aching    Pain Onset  1 to 4 weeks ago         [facility] PT Assessment - 05/20/20 0001      Assessment   Medical Diagnosis  L TKA    Referring Provider (PT)  John Poggi    Onset Date/Surgical Date  03/16/20    Hand Dominance  Right    Next MD Visit  06/18/20      Observation/Other Assessments   Skin Integrity  incision healing well, on scab still present towards top of incision, looks loose and like will fall off in next few days.                     [facility] Adult PT Treatment/Exercise - 05/20/20 0001      Knee/Hip Exercises: Seated   Other Seated Knee/Hip Exercises  contract/relax at edge of bed into flexion 5 second holds 3x5 L     Sit to Sand  5 reps   5 sets, UE on thighs, cues to use equal weight on both LEs     Knee/Hip Exercises: Supine   Knee Extension  AROM    Knee Extension Limitations  8    Knee Flexion  AROM    Knee Flexion Limitations  91    Other Supine Knee/Hip Exercises  heel slides with PT overpressure - not tolerated well - with green ball knee flexion bends - 5 minutes total with holds at end range flexion -- tolerated better             PT Education - 05/20/20 1433    Education Details  Educated patient in continued ROM restrictions, continued swelling and benefits of elevation and compression    Person(s) Educated  Patient    Methods  Explanation    Comprehension  Verbalized understanding  PT Short Term Goals - 04/28/20 0929      PT SHORT TERM GOAL #1   Title  Patient will be independent in self  management strategies to improve quality of life and functional outcomes.    Time  4    Period  Weeks    Status  On-going    Target Date  05/06/20      PT SHORT TERM GOAL #2   Title  Patient will report at least 25% improvement in overall symptoms and/or functional ability.    Baseline  5/5 70% better    Time  4    Period  Weeks    Status  Achieved    Target Date  05/06/20      PT SHORT TERM GOAL #3   Title  Patient will demonstrate 0-100 degrees of left knee motion.    Time  4    Period  Weeks    Status  On-going    Target Date  05/06/20      PT SHORT TERM GOAL #4   Status  --        PT Long Term Goals - 04/28/20 0930      PT LONG TERM GOAL #1   Title  Patient will be able to ambulate at least 226 feet wtih least restrictive device possible in 2 minutes to demonstrate improved functional mobility.    Time  8    Period  Weeks    Status  On-going      PT LONG TERM GOAL #2   Title  Patient will be able to ascend and descend stairs with alternating gait pattern with use of arms as needed to improve community mobility.    Time  8    Period  Weeks    Status  On-going      PT LONG TERM GOAL #3   Title  Patient will report at least 50% improvement in overall symptoms and/or functional ability    Baseline  5/5 70% better.    Time  8    Period  Weeks    Status  Achieved      PT LONG TERM GOAL #4   Status  --            Plan - 05/20/20 1148    Clinical Impression Statement  Patient continues to have ROM limitations. Inspected incision and patient still very hesitant with palpation surrounding knee. After reassurance able to determine no warmth to knee but swelling persists surrounding knee joint and calf. Continued to educate patient on elevation and use of compression stockings. Incision is healing well with on scab in place that loosened with knee ROM exercises during today's session. Patient with desire to discharge from PT next week, though patient would conitnue  to beneift from skilled physical therapy to improve funcitonal ROM and strength.    Personal Factors and Comorbidities  Comorbidity 1;Comorbidity 2;Age;Fitness    Comorbidities  left knee scope prior, DB, HTN    Examination-Activity Limitations  Bathing;Bed Mobility;Bend;Caring for Others;Carry;Dressing;Stairs;Squat;Sleep;Sit    Stability/Clinical Decision Making  Stable/Uncomplicated    Rehab Potential  Good    PT Frequency  --   3x/week for 3 weeks then 2x/week for 5 weeks   PT Duration  8 weeks    PT Treatment/Interventions  ADLs/Self Care Home Management;Iontophoresis 4mg /ml Dexamethasone;Gait training;Stair training;Neuromuscular re-education;Dry needling;Joint Manipulations;Scar mobilization;Passive range of motion;Manual techniques;Functional mobility training;Therapeutic activities;Therapeutic exercise;Balance training;DME Instruction;Electrical Stimulation;Cryotherapy;Biofeedback;Aquatic Therapy;Moist Heat;Traction;Ultrasound;Patient/family education    PT Next Visit Plan  continue with edema control and focusing on ROM    PT Home Exercise Plan  using cane at home., 4/30:active hamstring stretch, bridge , standing knee flexion; 5/3 thomas stretch, bridges; 5/18 contract/relax at EOB       Patient will benefit from skilled therapeutic intervention in order to improve the following deficits and impairments:  Abnormal gait, Decreased range of motion, Difficulty walking, Decreased endurance, Decreased activity tolerance, Decreased skin integrity, Pain, Hypomobility, Decreased scar mobility, Decreased knowledge of use of DME, Decreased balance, Decreased mobility, Decreased strength, Increased edema  Visit Diagnosis: Difficulty in walking, not elsewhere classified  Chronic pain of left knee  Localized edema  Muscle weakness (generalized)     Problem List Patient Active Problem List   Diagnosis Date Noted  . 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 S/P  total hip arthroplasty 10/18/2016  . Hypersomnia with sleep apnea 07/20/2015  . Hypersomnia, persistent 07/20/2015  . OSA (obstructive sleep apnea) 07/20/2015  . Lumbar compression fracture ([place]) 12/21/2014  . Morbid obesity ([place]) 03/07/2013  . Obstructive sleep apnea 03/07/2013  . Fibromyalgia 03/07/2013  . Other and unspecified hyperlipidemia 03/07/2013  . Hx of gastritis 03/07/2013  . S/P cholecystectomy 03/07/2013  . Chest pain 01/07/2013   2:34 PM, 05/20/20 Jerene Pitch, DPT Physical Therapy with [facility]  [phone] office  [place] [address] [place], [place], [address] Phone: [phone]   Fax:  [phone]  Name: WAUNITA KIHARA MRN: [id number] Date of Birth: [birth year]

## 2020-05-25 ENCOUNTER — Other Ambulatory Visit: Payer: Self-pay

## 2020-05-25 ENCOUNTER — Ambulatory Visit (HOSPITAL_COMMUNITY): Payer: Medicare Other | Attending: Student | Admitting: Physical Therapy

## 2020-05-25 DIAGNOSIS — M25662 Stiffness of left knee, not elsewhere classified: Secondary | ICD-10-CM | POA: Insufficient documentation

## 2020-05-25 NOTE — Therapy (Addendum)
[place] [address] [place], [place], [address] Phone: [phone]   Fax:  [phone]  Physical Therapy Treatment and Discharge Note  Patient Details  Name: [name] MRN: [id number] Date of Birth: December 03, 1946 Referring Provider (PT): Milagros Evener   PHYSICAL THERAPY DISCHARGE SUMMARY  Visits from Start of Care: 16  Current functional level related to goals / functional outcomes: See below   Remaining deficits: See below   Education / Equipment: See below Plan: Patient agrees to discharge.  Patient goals were partially met. Patient is being discharged due to not returning since the last visit.  ?????    8:15 AM, 10/18/20 Jerene Pitch, DPT Physical Therapy with [facility]  [phone] office     Encounter Date: 05/25/2020  PT End of Session - 05/25/20 1214    Visit Number  16    Number of Visits  19    Date for PT Re-Evaluation  06/03/20   POC 4/15-->06/03/20   Authorization Type  UHC Medicare No DED, NO VL NO Auth, AQ included    Progress Note Due on Visit  53    PT Start Time  1130    PT Stop Time  1210    PT Time Calculation (min)  40 min    Activity Tolerance  Patient tolerated treatment well    Behavior During Therapy  WFL for tasks assessed/performed       Past Medical History:  Diagnosis Date  . Allergy    SEASONAL-flonase daily as well as Claritin  . Anemia    takes Ferrous Sulfate daily  . Anxiety    takes Celexa every evening  . Arthritis    BACK/KNEE  . Cataract    IMMATURE AND ON LEFT EYE SHE THINKS  . Chronic back pain   . Chronic back pain    L1 FRACTURE  . Depression    takes Wellbutrin daily  . Diabetes mellitus without complication ([place])    type II  . Dyspnea    with exertion  . Fibromyalgia   . GERD (gastroesophageal reflux disease)    takes Omeprazole daily and Zantac at bedtime  . H/O hiatal hernia   . History of colon polyps   . History of shingles    . Hyperlipidemia    takes Pravastatin daily as well as Questran  . Hypertension    takes Diovan daily  . Hypothyroidism   . Insomnia    takes trazodone nightly  . Joint pain   . Muscle spasm    takes Robaxin daily as needed  . Nausea    takes Zofran daily as needed  . Nocturia   . Numbness and tingling    LEGS  . Osteoporosis    takes fosamax  . Sleep apnea    uses cpap  . Thyroid disease    takes Synthroid daily    Past Surgical History:  Procedure Laterality Date  . ABDOMINAL HYSTERECTOMY    . CARDIAC CATHETERIZATION    . CARPAL TUNNEL RELEASE     right and lt  . CHOLECYSTECTOMY    . COLONOSCOPY    . COLONOSCOPY    . EYE SURGERY Bilateral    Cataract with lens  . FOOT SURGERY Bilateral   . FRACTURE SURGERY     rt hand.  no metal  . FRACTURE SURGERY     lr wrist. no metal  . HAND SURGERY Right 06/1985  . HAND SURGERY Left   .  JOINT REPLACEMENT Right    THR  . KNEE ARTHROSCOPY Left   . KNEE ARTHROSCOPY Bilateral   . KYPHOPLASTY N/A 12/21/2014   Procedure: KYPHOPLASTY LUMBAR ONE;  Surgeon: Newman Pies, MD;  Location: [place] [facility];  Service: Neurosurgery;  Laterality: N/A;  L1 Kyphoplasty  . ORIF FINGER FRACTURE  04/11/2012   Procedure: OPEN REDUCTION INTERNAL FIXATION (ORIF) METACARPAL (FINGER) FRACTURE;  Surgeon: Ninetta Lights, MD;  Location: [place];  Service: Orthopedics;  Laterality: Left;  orif left hand 4th metacarpal   . 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 RECTOCELE REPAIR    . SHOULDER ARTHROSCOPY WITH DISTAL CLAVICLE RESECTION Right 05/20/2015   Procedure: SHOULDER ARTHROSCOPY WITH DISTAL CLAVICLE RESECTION DEBRIDEMENT PARTIAL ROTATOR CUFF LABRAL TEAR REMOVAL LOOSE BODIES;  Surgeon: Kathryne Hitch, MD;  Location: [place];  Service: Orthopedics;  Laterality: Right;  . TONSILLECTOMY    . TOTAL HIP ARTHROPLASTY Right 10/18/2016   Procedure: TOTAL HIP ARTHROPLASTY ANTERIOR APPROACH;  Surgeon: Ninetta Lights, MD;  Location: [place];  Service: Orthopedics;  Laterality: Right;  . TOTAL KNEE ARTHROPLASTY Left 03/16/2020   Procedure: TOTAL KNEE ARTHROPLASTY;  Surgeon: Corky Mull, MD;  Location: [facility];  Service: Orthopedics;  Laterality: Left;  . UPPER GASTROINTESTINAL ENDOSCOPY  02/2013  . WRIST SURGERY Left     There were no vitals filed for this visit.  Subjective Assessment - 05/25/20 1149    Subjective  Pt states that she slept in her bed for the first time this weekend and now her Rt hip hurts.    Pertinent History  HTN, arthritis, history of left knee scope    Patient Stated Goals  to be able to walk better    Currently in Pain?  Yes    Pain Score  7     Pain Location  Knee    Pain Orientation  Left    Pain Descriptors / Indicators  Aching    Pain Onset  1 to 4 weeks ago    Pain Frequency  Constant    Aggravating Factors   bending    Pain Relieving Factors  ice    Multiple Pain Sites  Yes    Pain Score  4    Pain Location  Hip    Pain Orientation  Right    Pain Descriptors / Indicators  Sore    Pain Type  Acute pain    Pain Onset  Yesterday    Pain Frequency  Intermittent    Aggravating Factors   wt bearing    Pain Relieving Factors  sitting                        [facility] Adult PT Treatment/Exercise - 05/25/20 0001      Knee/Hip Exercises: Stretches   Active Hamstring Stretch  Left;3 reps;20 seconds    Quad Stretch  Left;3 reps;20 seconds      Knee/Hip Exercises: Standing   Heel Raises  Both;10 reps      Knee/Hip Exercises: Seated   Long Arc Quad  10 reps    Heel Slides  AAROM;10 reps;AROM   AROM to end range then Rt LE assist   Other Seated Knee/Hip Exercises  contract/relax at edge of bed into flexion 5 second holds 3x5 L       Knee/Hip Exercises: Supine   Knee Extension Limitations  5    Knee Flexion  AROM  Knee Flexion Limitations  95      Knee/Hip Exercises: Prone   Hamstring Curl  10 reps    Hip Extension  Both;10 reps       Manual Therapy   Manual Therapy  Manual Lymphatic Drainage (MLD)    Manual therapy comments  completed seperate from all other activities     Joint Mobilization  patella mobilization L in all directions grade II/II, tib fib mobilizations grade II/III to promote flexion and extension -tolerated well     Manual Lymphatic Drainage (MLD)  to reduce edema and pain to improve ROM                PT Short Term Goals - 04/28/20 0929      PT SHORT TERM GOAL #1   Title  Patient will be independent in self management strategies to improve quality of life and functional outcomes.    Time  4    Period  Weeks    Status  On-going    Target Date  05/06/20      PT SHORT TERM GOAL #2   Title  Patient will report at least 25% improvement in overall symptoms and/or functional ability.    Baseline  5/5 70% better    Time  4    Period  Weeks    Status  Achieved    Target Date  05/06/20      PT SHORT TERM GOAL #3   Title  Patient will demonstrate 0-100 degrees of left knee motion.    Time  4    Period  Weeks    Status  On-going    Target Date  05/06/20      PT SHORT TERM GOAL #4   Status  --        PT Long Term Goals - 04/28/20 0930      PT LONG TERM GOAL #1   Title  Patient will be able to ambulate at least 226 feet wtih least restrictive device possible in 2 minutes to demonstrate improved functional mobility.    Time  8    Period  Weeks    Status  On-going      PT LONG TERM GOAL #2   Title  Patient will be able to ascend and descend stairs with alternating gait pattern with use of arms as needed to improve community mobility.    Time  8    Period  Weeks    Status  On-going      PT LONG TERM GOAL #3   Title  Patient will report at least 50% improvement in overall symptoms and/or functional ability    Baseline  5/5 70% better.    Time  8    Period  Weeks    Status  Achieved      PT LONG TERM GOAL #4   Status  --            Plan - 05/25/20 1215    Clinical  Impression Statement  Pt came to department with high pain level, manual decongestive techniques decreased pain in Lt knee significantly.  Pt able to achieve greater ROM today compared with last weeks measurements. Began patellar mobs to improve ROM    Personal Factors and Comorbidities  Comorbidity 1;Comorbidity 2;Age;Fitness    Comorbidities  left knee scope prior, DB, HTN    Examination-Activity Limitations  Bathing;Bed Mobility;Bend;Caring for Others;Carry;Dressing;Stairs;Squat;Sleep;Sit    Stability/Clinical Decision Making  Stable/Uncomplicated    Rehab Potential  Good  PT Frequency  --   3x/week for 3 weeks then 2x/week for 5 weeks   PT Duration  8 weeks    PT Treatment/Interventions  ADLs/Self Care Home Management;Iontophoresis 79m/ml Dexamethasone;Gait training;Stair training;Neuromuscular re-education;Dry needling;Joint Manipulations;Scar mobilization;Passive range of motion;Manual techniques;Functional mobility training;Therapeutic activities;Therapeutic exercise;Balance training;DME Instruction;Electrical Stimulation;Cryotherapy;Biofeedback;Aquatic Therapy;Moist Heat;Traction;Ultrasound;Patient/family education    PT Next Visit Plan  PT only has one more visit scheduled but has three more viists per our count.   See if pt wants to continue for one more week or not. continue with edema control and focusing on ROM    PT Home Exercise Plan  using cane at home., 4/30:active hamstring stretch, bridge , standing knee flexion; 5/3 thomas stretch, bridges; 5/18 contract/relax at EOB       Patient will benefit from skilled therapeutic intervention in order to improve the following deficits and impairments:  Abnormal gait, Decreased range of motion, Difficulty walking, Decreased endurance, Decreased activity tolerance, Decreased skin integrity, Pain, Hypomobility, Decreased scar mobility, Decreased knowledge of use of DME, Decreased balance, Decreased mobility, Decreased strength, Increased  edema  Visit Diagnosis: Stiffness of left knee     Problem List Patient Active Problem List   Diagnosis Date Noted  . 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 S/P total hip arthroplasty 10/18/2016  . Hypersomnia with sleep apnea 07/20/2015  . Hypersomnia, persistent 07/20/2015  . OSA (obstructive sleep apnea) 07/20/2015  . [place]bar compression fracture ([place] 12/21/2014  . Morbid obesity ([place] 03/07/2013  . Obstructive sleep apnea 03/07/2013  . Fibromyalgia 03/07/2013  . Other and unspecified hyperlipidemia 03/07/2013  . Hx of gastritis 03/07/2013  . S/P cholecystectomy 03/07/2013  . Chest pain 01/07/2013    CRayetta Humphrey PT CLT [phone]/12/2019, 12:20 PM  [place] [address]Phone: [phone]  Fax:  [phone] Name: MSEAN MACWILLIAMSMRN: [id number] of Birth: 5Oct 30, 1947

## 2020-05-27 ENCOUNTER — Ambulatory Visit (HOSPITAL_COMMUNITY): Payer: Medicare Other

## 2020-05-27 ENCOUNTER — Telehealth (HOSPITAL_COMMUNITY): Payer: Self-pay

## 2020-05-27 NOTE — Telephone Encounter (Signed)
pt cancelled appt for today because she is not feeling

## 2020-07-23 ENCOUNTER — Telehealth: Payer: Self-pay | Admitting: Gastroenterology

## 2020-07-23 NOTE — Telephone Encounter (Signed)
Patty,  I received a lab result from her primary care physician Dr. Dagmar Hait dated 06/2020: Hemosure positive  I am not sure why her stool was checked for microscopic blood.  I did a colonoscopy for her in 2019 and she had a single subcentimeter tubular adenoma.  She is on a blood thinner.  Can you please contact the referring, primary care physician and ask for some context regarding why her stool was checked for blood?  Is she anemic?  Is she reporting any bleeding?  Was this just done as part of a routine physical, in which case she is really being over screened for colon cancer?

## 2020-07-23 NOTE — Telephone Encounter (Signed)
Left message on Dr Danna Hefty assistant's voice mail to call in regards to the message below.

## 2020-07-26 NOTE — Telephone Encounter (Signed)
Left message on CMA machine to call back to discuss

## 2020-07-27 NOTE — Telephone Encounter (Signed)
Dr Ardis Hughs the nurse called from Dr Danna Hefty office and state that the pt actually did complain of "smooldering abd pain in the setting of positive stool card"  I offered 10/8 appt (first available).  The nurse states she will call back if Dr Dagmar Hait was ok with waiting for that appt.  I did advise her that the appts go fast and should call back as soon as possible to schedule.

## 2020-07-27 NOTE — Telephone Encounter (Signed)
Message left with [facility] for return call to discuss

## 2020-07-27 NOTE — Telephone Encounter (Signed)
I spoke with the CMA at Dr Danna Hefty nurse and she states that the hemoccult was a part of routine follow up.  No symptoms.  I advised her that she does not need follow up at this time unless she has symptoms.

## 2020-07-28 NOTE — Telephone Encounter (Signed)
10/01/20 at 850 am appt made with Dr Ardis Hughs.

## 2020-07-28 NOTE — Telephone Encounter (Signed)
The patient has been notified of this information and all questions answered.

## 2020-07-28 NOTE — Telephone Encounter (Signed)
Dr. Danna Hefty nurse is calling you back. Did state that the appointment you offered would be fine.

## 2020-08-12 ENCOUNTER — Encounter: Payer: Self-pay | Admitting: Emergency Medicine

## 2020-08-12 ENCOUNTER — Other Ambulatory Visit: Payer: Self-pay

## 2020-08-12 ENCOUNTER — Ambulatory Visit
Admission: EM | Admit: 2020-08-12 | Discharge: 2020-08-12 | Disposition: A | Discharge status: Home / Self Care | Payer: Medicare Other | Attending: Emergency Medicine | Admitting: Emergency Medicine

## 2020-08-12 DIAGNOSIS — M549 Dorsalgia, unspecified: Secondary | ICD-10-CM | POA: Insufficient documentation

## 2020-08-12 LAB — POCT URINALYSIS DIP (MANUAL ENTRY)
Bilirubin, UA: NEGATIVE
Blood, UA: NEGATIVE
Glucose, UA: NEGATIVE mg/dL
Ketones, POC UA: NEGATIVE mg/dL
Leukocytes, UA: NEGATIVE
Nitrite, UA: NEGATIVE
Protein Ur, POC: NEGATIVE mg/dL
Spec Grav, UA: >=1.03 — AB (ref 1.010–1.025)
Urobilinogen, UA: 0.2 E.U./dL
pH, UA: 5 (ref 5.0–8.0)

## 2020-08-12 MED ORDER — ACETAMINOPHEN 500 MG PO TABS: 500.0000 mg | ORAL_TABLET | Freq: Four times a day (QID) | ORAL | 0 refills | Status: AC | PRN

## 2020-08-12 MED ORDER — KETOROLAC TROMETHAMINE 60 MG/2ML IM SOLN
30.0000 mg | Freq: Once | INTRAMUSCULAR | Status: AC
  Administered 2020-08-12: 30 mg via INTRAMUSCULAR

## 2020-08-12 MED ORDER — PREDNISONE 10 MG PO TABS: 20.0000 mg | ORAL_TABLET | Freq: Every day | ORAL | 0 refills | Status: DC

## 2020-08-12 MED ORDER — DEXAMETHASONE SODIUM PHOSPHATE 10 MG/ML IJ SOLN
10.0000 mg | Freq: Once | INTRAMUSCULAR | Status: AC
  Administered 2020-08-12: 10 mg via INTRAMUSCULAR

## 2020-08-12 MED ORDER — CYCLOBENZAPRINE HCL 5 MG PO TABS: 5.0000 mg | ORAL_TABLET | Freq: Three times a day (TID) | ORAL | 0 refills | Status: DC | PRN

## 2020-08-12 NOTE — ED Triage Notes (Signed)
Pain to LT mid back x 3 days. Denies any injuries.  States she has osteoporosis and has hx of fractures.

## 2020-08-12 NOTE — Discharge Instructions (Addendum)
Rest, ice and heat as needed Ensure adequate ROM as tolerated. Prescribed Tylenol for pain relief Prescribed steroid for inflammation Prescribed flexeril  for muscle spasm.  Do not drive or operate heavy machinery while taking this medication Return here or go to ER if you have any new or worsening symptoms such as numbness/tingling of the inner thighs, loss of bladder or bowel control, headache/blurry vision, nausea/vomiting, confusion/altered mental status, dizziness, weakness, passing out, imbalance, etc..

## 2020-08-12 NOTE — ED Provider Notes (Signed)
[place]   [id number] 08/12/20 Arrival Time: 5027   Chief Complaint  Patient presents with  . Back Pain     SUBJECTIVE: History from: patient.  [name] is a 74 y.o. female with history of osteoporosis presented to the urgent care for complaint of left mid back pain for the past 3 days.  Denies any precipitating event, trauma or injury.  He localizes the pain to the left mid back.  She describes the pain as constant and achy.  She has tried OTC medications without relief.  Her symptoms are made worse with ROM.  She denies similar symptoms in the past.  Denies chills, fever, nausea, vomiting, diarrhea    ROS: As per HPI.  All other pertinent ROS negative.     Past Medical History:  Diagnosis Date  . Allergy    SEASONAL-flonase daily as well as Claritin  . Anemia    takes Ferrous Sulfate daily  . Anxiety    takes Celexa every evening  . Arthritis    BACK/KNEE  . Cataract    IMMATURE AND ON LEFT EYE SHE THINKS  . Chronic back pain   . Chronic back pain    L1 FRACTURE  . Depression    takes Wellbutrin daily  . Diabetes mellitus without complication ([place])    type II  . Dyspnea    with exertion  . Fibromyalgia   . GERD (gastroesophageal reflux disease)    takes Omeprazole daily and Zantac at bedtime  . H/O hiatal hernia   . History of colon polyps   . History of shingles   . Hyperlipidemia    takes Pravastatin daily as well as Questran  . Hypertension    takes Diovan daily  . Hypothyroidism   . Insomnia    takes trazodone nightly  . Joint pain   . Muscle spasm    takes Robaxin daily as needed  . Nausea    takes Zofran daily as needed  . Nocturia   . Numbness and tingling    LEGS  . Osteoporosis    takes fosamax  . Sleep apnea    uses cpap  . Thyroid disease    takes Synthroid daily   Past Surgical History:  Procedure Laterality Date  . ABDOMINAL HYSTERECTOMY    . CARDIAC CATHETERIZATION    . CARPAL TUNNEL RELEASE     right and lt   . CHOLECYSTECTOMY    . COLONOSCOPY    . COLONOSCOPY    . EYE SURGERY Bilateral    Cataract with lens  . FOOT SURGERY Bilateral   . FRACTURE SURGERY     rt hand.  no metal  . FRACTURE SURGERY     lr wrist. no metal  . HAND SURGERY Right 06/1985  . HAND SURGERY Left   . JOINT REPLACEMENT Right    THR  . KNEE ARTHROSCOPY Left   . KNEE ARTHROSCOPY Bilateral   . KYPHOPLASTY N/A 12/21/2014   Procedure: KYPHOPLASTY LUMBAR ONE;  Surgeon: Newman Pies, MD;  Location: [facility];  Service: Neurosurgery;  Laterality: N/A;  L1 Kyphoplasty  . ORIF FINGER FRACTURE  04/11/2012   Procedure: OPEN REDUCTION INTERNAL FIXATION (ORIF) METACARPAL (FINGER) FRACTURE;  Surgeon: Ninetta Lights, MD;  Location: [place];  Service: Orthopedics;  Laterality: Left;  orif left hand 4th metacarpal   . 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 RECTOCELE REPAIR    . SHOULDER ARTHROSCOPY WITH DISTAL  CLAVICLE RESECTION Right 05/20/2015   Procedure: SHOULDER ARTHROSCOPY WITH DISTAL CLAVICLE RESECTION DEBRIDEMENT PARTIAL ROTATOR CUFF LABRAL TEAR REMOVAL LOOSE BODIES;  Surgeon: Kathryne Hitch, MD;  Location: [place] [facility];  Service: Orthopedics;  Laterality: Right;  . TONSILLECTOMY    . TOTAL HIP ARTHROPLASTY Right 10/18/2016   Procedure: TOTAL HIP ARTHROPLASTY ANTERIOR APPROACH;  Surgeon: Ninetta Lights, MD;  Location: [place];  Service: Orthopedics;  Laterality: Right;  . TOTAL KNEE ARTHROPLASTY Left 03/16/2020   Procedure: TOTAL KNEE ARTHROPLASTY;  Surgeon: Corky Mull, MD;  Location: [facility];  Service: Orthopedics;  Laterality: Left;  . UPPER GASTROINTESTINAL ENDOSCOPY  02/2013  . WRIST SURGERY Left    Allergies  Allergen Reactions  . Sulfa Antibiotics Swelling    Face, throat, body  . Penicillins Itching    Has patient had a PCN reaction causing immediate rash, facial/tongue/throat swelling, SOB or lightheadedness with hypotension: No Has patient had a PCN reaction  causing severe rash involving mucus membranes or skin necrosis: No Has patient had a PCN reaction that required hospitalization No Has patient had a PCN reaction occurring within the last 10 years: No If all of the above answers are "NO", then may proceed with Cephalosporin use.    No current facility-administered medications on file prior to encounter.   Current Outpatient Medications on File Prior to Encounter  Medication Sig Dispense Refill  . buPROPion (WELLBUTRIN SR) 150 MG 12 hr tablet Take 150 mg by mouth daily.    . Calcium Carbonate-Vitamin D (CALCIUM 600/VITAMIN D PO) Take 1 tablet by mouth daily.    . citalopram (CELEXA) 40 MG tablet Take 40 mg by mouth daily. In the afternoon    . enoxaparin (LOVENOX) 40 MG/0.4ML injection Inject 0.4 mLs (40 mg total) into the skin daily. 5.6 mL 0  . ferrous sulfate 325 (65 FE) MG tablet Take 325 mg by mouth daily with breakfast.    . fluticasone (FLONASE) 50 MCG/ACT nasal spray Place 2 sprays into the nose daily.    Marland Kitchen ibuprofen (ADVIL) 200 MG tablet Take 400 mg by mouth every 6 (six) hours as needed for moderate pain.    Marland Kitchen levothyroxine (SYNTHROID) 125 MCG tablet Take 125 mcg by mouth daily before breakfast.     . loratadine (CLARITIN) 10 MG tablet Take 10 mg by mouth daily. In the morning    . metFORMIN (GLUCOPHAGE-XR) 500 MG 24 hr tablet Take 500 mg by mouth every evening.  1  . Omega-3 Fatty Acids (FISH OIL) 500 MG CAPS Take 500 mg by mouth daily.     Marland Kitchen omeprazole (PRILOSEC) 40 MG capsule Take 1 capsule (40 mg total) by mouth daily before breakfast. (Patient not taking: Reported on 02/09/2020) 30 capsule 11  . ondansetron (ZOFRAN ODT) 4 MG disintegrating tablet Take 1 tablet (4 mg total) by mouth every 8 (eight) hours as needed for nausea or vomiting. (Patient not taking: Reported on 02/09/2020) 20 tablet 0  . ondansetron (ZOFRAN) 4 MG tablet Take 1 tablet (4 mg total) by mouth every 6 (six) hours as needed for nausea. (Patient not taking:  Reported on 02/09/2020) 20 tablet 0  . oxyCODONE (OXY IR/ROXICODONE) 5 MG immediate release tablet Take 1-2 tablets (5-10 mg total) by mouth every 4 (four) hours as needed for moderate pain (pain score 4-6). 60 tablet 0  . potassium chloride SA (K-DUR,KLOR-CON) 20 MEQ tablet Take 20 mEq by mouth 2 (two) times daily.    . pravastatin (PRAVACHOL) 20 MG tablet  Take 20 mg by mouth at bedtime.     Marland Kitchen tiZANidine (ZANAFLEX) 2 MG tablet Take 1 tablet (2 mg total) by mouth every 8 (eight) hours as needed for muscle spasms. 30 tablet 0  . traMADol (ULTRAM) 50 MG tablet Take 1-2 tablets (50-100 mg total) by mouth every 6 (six) hours as needed for moderate pain. 40 tablet 0  . traZODone (DESYREL) 50 MG tablet Take 50 mg by mouth at bedtime.    . valsartan-hydrochlorothiazide (DIOVAN-HCT) 80-12.5 MG per tablet Take 1 tablet by mouth daily.     Social History   Socioeconomic History  . Marital status: Married    Spouse name: [name]  . Number of children: 5  . Years of education: Not on file  . Highest education level: Not on file  Occupational History  . Occupation: retired  Tobacco Use  . Smoking status: Former Research scientist (life sciences)  . Smokeless tobacco: Never Used  . Tobacco comment: as a young erson, off and on , never had a habit  Vaping Use  . Vaping Use: Never used  Substance and Sexual Activity  . Alcohol use: No  . Drug use: No  . Sexual activity: Yes    Birth control/protection: Surgical  Other Topics Concern  . Not on file  Social History Narrative   Drinks 1 cup of coffee daily. 1 diet pepsi.   Lives with husband   Social Determinants of Health   Financial Resource Strain:   . Difficulty of Paying Living Expenses: Not on file  Food Insecurity:   . Worried About Charity fundraiser in the Last Year: Not on file  . Ran Out of Food in the Last Year: Not on file  Transportation Needs:   . Lack of Transportation (Medical): Not on file  . Lack of Transportation (Non-Medical): Not on file  Physical  Activity:   . Days of Exercise per Week: Not on file  . Minutes of Exercise per Session: Not on file  Stress:   . Feeling of Stress : Not on file  Social Connections:   . Frequency of Communication with Friends and Family: Not on file  . Frequency of Social Gatherings with Friends and Family: Not on file  . Attends Religious Services: Not on file  . Active Member of Clubs or Organizations: Not on file  . Attends Archivist Meetings: Not on file  . Marital Status: Not on file  Intimate Partner Violence:   . Fear of Current or Ex-Partner: Not on file  . Emotionally Abused: Not on file  . Physically Abused: Not on file  . Sexually Abused: Not on file   Family History  Problem Relation Age of Onset  . Hypertension Mother   . Diabetes Mother   . Cancer Maternal Grandfather        back  . Cancer Paternal Uncle   . Lung cancer Paternal Aunt   . Lung cancer Sister   . Heart attack Brother        x 2    OBJECTIVE:  Vitals:   08/12/20 1755 08/12/20 1756  BP:  131/79  Pulse:  69  Resp:  19  Temp:  98.4 F (36.9 C)  TempSrc:  Oral  SpO2:  96%  Weight: 207 lb (93.9 kg)   Height: 4\' 11"  (1.499 m)      Physical Exam Vitals and nursing note reviewed.  Constitutional:      General: She is not in acute distress.  Appearance: Normal appearance. She is normal weight. She is not ill-appearing, toxic-appearing or diaphoretic.  HENT:     Head: Normocephalic.  Cardiovascular:     Rate and Rhythm: Normal rate and regular rhythm.     Pulses: Normal pulses.     Heart sounds: Normal heart sounds. No murmur heard.  No friction rub. No gallop.   Pulmonary:     Effort: Pulmonary effort is normal. No respiratory distress.     Breath sounds: Normal breath sounds. No stridor. No wheezing, rhonchi or rales.  Chest:     Chest wall: No tenderness.  Abdominal:     General: Bowel sounds are normal. There is no distension.     Palpations: There is no mass.     Tenderness: There  is no abdominal tenderness. There is left CVA tenderness. There is no guarding or rebound.     Hernia: No hernia is present.  Musculoskeletal:     Cervical back: Normal.     Thoracic back: Spasms and tenderness present.  Neurological:     Mental Status: She is alert and oriented to person, place, and time.     LABS:  Results for orders placed or performed during the hospital encounter of 08/12/20 (from the past 24 hour(s))  POCT urinalysis dipstick     Status: Abnormal   Collection Time: 08/12/20  6:26 PM  Result Value Ref Range   Color, UA yellow yellow   Clarity, UA clear clear   Glucose, UA negative negative mg/dL   Bilirubin, UA negative negative   Ketones, POC UA negative negative mg/dL   Spec Grav, UA >=1.030 (A) 1.010 - 1.025   Blood, UA negative negative   pH, UA 5.0 5.0 - 8.0   Protein Ur, POC negative negative mg/dL   Urobilinogen, UA 0.2 0.2 or 1.0 E.U./dL   Nitrite, UA Negative Negative   Leukocytes, UA Negative Negative     ASSESSMENT & PLAN:  1. Mid back pain on left side     Meds ordered this encounter  Medications  . cyclobenzaprine (FLEXERIL) 5 MG tablet    Sig: Take 1 tablet (5 mg total) by mouth 3 (three) times daily as needed.    Dispense:  30 tablet    Refill:  0  . predniSONE (DELTASONE) 10 MG tablet    Sig: Take 2 tablets (20 mg total) by mouth daily.    Dispense:  15 tablet    Refill:  0  . acetaminophen (TYLENOL) 500 MG tablet    Sig: Take 1 tablet (500 mg total) by mouth every 6 (six) hours as needed.    Dispense:  30 tablet    Refill:  0   Discharge instructions Rest, ice and heat as needed Ensure adequate ROM as tolerated. Prescribed Tylenol for pain relief Prescribed steroid for inflammation Prescribed flexeril  for muscle spasm.  Do not drive or operate heavy machinery while taking this medication Return here or go to ER if you have any new or worsening symptoms such as numbness/tingling of the inner thighs, loss of bladder or  bowel control, headache/blurry vision, nausea/vomiting, confusion/altered mental status, dizziness, weakness, passing out, imbalance, etc...    Reviewed expectations re: course of current medical issues. Questions answered. Outlined signs and symptoms indicating need for more acute intervention. Patient verbalized understanding. After Visit Summary given.      Note: This document was prepared using Dragon voice recognition software and may include unintentional dictation errors.    Emerson Monte, FNP  08/12/20 1832  

## 2020-08-14 LAB — URINE CULTURE: Culture: <10000 — AB

## 2020-08-19 ENCOUNTER — Ambulatory Visit: Payer: Medicare Other | Admitting: Neurology

## 2020-08-19 ENCOUNTER — Other Ambulatory Visit: Payer: Self-pay

## 2020-08-19 ENCOUNTER — Encounter: Payer: Self-pay | Admitting: Neurology

## 2020-08-19 VITALS — BP 150/89 | HR 72 | Ht 59.5 in | Wt 211.0 lb

## 2020-08-19 DIAGNOSIS — S32040D Wedge compression fracture of fourth lumbar vertebra, subsequent encounter for fracture with routine healing: Secondary | ICD-10-CM

## 2020-08-19 DIAGNOSIS — R0683 Snoring: Secondary | ICD-10-CM

## 2020-08-19 DIAGNOSIS — G478 Other sleep disorders: Secondary | ICD-10-CM

## 2020-08-19 DIAGNOSIS — Z6841 Body Mass Index (BMI) 40.0 and over, adult: Secondary | ICD-10-CM

## 2020-08-19 DIAGNOSIS — G4761 Periodic limb movement disorder: Secondary | ICD-10-CM | POA: Diagnosis not present

## 2020-08-19 NOTE — Progress Notes (Signed)
SLEEP MEDICINE CLINIC    Provider:  Larey Seat, MD    Primary Care Physician:  Prince Solian, [place] [address]     Referring Provider: Prince Solian, [place],  [place] [address]          Chief Complaint according to patient   Patient presents with:    . New Patient (Initial Visit)           HISTORY OF PRESENT ILLNESS:  [name] is a 74 y.o. year old White or Caucasian female patient seen here upon a referral on 08/19/2020 fChief concern according to patient :  " I am still very tired" on "CPAP and snoring'   I have the pleasure of seeing [name] today, a right -handed White or Caucasian female with a diagnosed  sleep disorder.    08-19-2020,   I have the pleasure of meeting [name]. Morell today, she has been a patient in our practice in 2016 at the time her main problem was snoring obesity she underwent a sleep study and was diagnosed with mild sleep apnea I "hear from her sleep study dated 08/10/2015.  Patient endorsed the Epworth Sleepiness Scale at 16 out of 24 points with a high degree of sleepiness and also endorses depression scale at 18 points.  BMI 44.7 at the time of this consultation.  The patient had 6 obstructive apneas 13 central and 40 hypopneas her AHI was 9.7.  RDI was 11.8.  REM accentuated the AHI to 13.3 but it was not REM dependent sleep apnea.  She had frequent periodic limb movements with an arousal index of 7.2/h.  I wrote at the time for a CPAP machine and she shows a [employer] a flex which is now 62 years old or older.  The machine provides an average AHI of 4/h now present expiratory pressure setting of 18 of an inspiratory pressure setting of 21 this is a BiPAP setting 21/18 a rather high pressure she did use a CPAP pressure as well and the auto CPAP mean pressure 90% of the time is 10.8 cmH2O not quite sure if the machine switches between 2 functions were was recently altered.   She has been using the machine for 100% of the last 30 days and her average use at time is 9 hours 45 seconds.  The device apparently was set to 8 CPAP Ultraflex with a minimum pressure of 5 maximum pressure of 12 and to centimeter a flex setting this is what I had ordered originally.   She still feels like she does wake up just is exhausted as she was when to when she went to bed.  Her new fatigue severity score is 48 /63 points her Epworth sleepiness score is 16 / 24 again highly elevated.  Was followed by [facility]-   Dr. Dagmar Hait states that she would be presented to a routine annual evaluation at his office on July 20 she had some external stressors the health of her husband as well as her sister's husband dying of [place] in January.   She feels very tired she has not been seen by neurology in 5 years and is highly suspicious that her CPAP machine may need adjustment but actually improved with need replacement.    She has a history of a pituitary abnormality of frequent stool, chronic overactive bladder, urge incontinence, and a longstanding history of gastritis, hyperlipidemia, edema lower extremity edema obesity, obstructive  sleep apnea GERD, CT abdomen and pelvis was possible lipoma small hiatal hernia, osteopenia since 2007 fibrocystic myofascial syndrome, is followed by Dr. 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  She is now followed by Dr. Michiel Sites, she has a carpal tunnel syndrome and hypothyroidism.   Social history:  Patient is retired from Psychologist, educational  and lives in a household with spouse,  Pets are not  present. Tobacco use- quit 35 years.  ETOH use; none,  Caffeine intake in form of Coffee( 1 cup in AM ) Soda( /) Tea ( /) or energy drinks.   Hobbies : arts and crafts      Sleep habits are as follows: The patient's dinner time is between 5-6 PM. The patient goes to bed at 10-11PM and continues to sleep for intervals 1-2  hours, wakes for one bathroom breaks, wakes from pain,  Knee pain, back pain- The preferred sleep position is on her side , with the support of 2-3 pillows. Dreams are reportedly rare.  8-10 AM is the usual rise time.The patient wakes up spontaneously..   She reports not feeling refreshed or restored in AM, with symptoms such as dry mouth morning headache,and residual fatigue.  Naps are taken  frequently, lasting from 1-2 hours and are not more , actually less refreshing than nocturnal sleep.    Review of Systems: Out of a complete 14 system review, the patient complains of only the following symptoms, and all other reviewed systems are negative.:  Fatigue, sleepiness , snoring, fragmented sleep, Insomnia due to pain, chronic pain.  How likely are you to doze in the following situations: 0 = not likely, 1 = slight chance, 2 = moderate chance, 3 = high chance   Sitting and Reading? Watching Television? Sitting inactive in a public place (theater or meeting)? As a passenger in a car for an hour without a break? Lying down in the afternoon when circumstances permit? Sitting and talking to someone? Sitting quietly after lunch without alcohol? In a car, while stopped for a few minutes in traffic?   Total = 15/ 24 points   FSS endorsed at 48/ 63 points.   Social History   Socioeconomic History  . Marital status: Married    Spouse name: [name]  . Number of children: 5  . Years of education: Not on file  . Highest education level: Not on file  Occupational History  . Occupation: retired  Tobacco Use  . Smoking status: Former Research scientist (life sciences)  . Smokeless tobacco: Never Used  . Tobacco comment: as a young erson, off and on , never had a habit  Vaping Use  . Vaping Use: Never used  Substance and Sexual Activity  . Alcohol use: No  . Drug use: No  . Sexual activity: Yes    Birth control/protection: Surgical  Other Topics Concern  . Not on file  Social History Narrative   Drinks 1 cup of  coffee daily. 1 diet pepsi.   Lives with husband   Social Determinants of Health   Financial Resource Strain:   . Difficulty of Paying Living Expenses: Not on file  Food Insecurity:   . Worried About Charity fundraiser in the Last Year: Not on file  . Ran Out of Food in the Last Year: Not on file  Transportation Needs:   . Lack of Transportation (Medical): Not on file  . Lack of Transportation (Non-Medical): Not on file  Physical  Activity:   . Days of Exercise per Week: Not on file  . Minutes of Exercise per Session: Not on file  Stress:   . Feeling of Stress : Not on file  Social Connections:   . Frequency of Communication with Friends and Family: Not on file  . Frequency of Social Gatherings with Friends and Family: Not on file  . Attends Religious Services: Not on file  . Active Member of Clubs or Organizations: Not on file  . Attends Archivist Meetings: Not on file  . Marital Status: Not on file    Family History  Problem Relation Age of Onset  . Hypertension Mother   . Diabetes Mother   . Cancer Maternal Grandfather        back  . Cancer Paternal Uncle   . Lung cancer Paternal Aunt   . Lung cancer Sister   . Heart attack Brother        x 2    Past Medical History:  Diagnosis Date  . Allergy    SEASONAL-flonase daily as well as Claritin  . Anemia    takes Ferrous Sulfate daily  . Anxiety    takes Celexa every evening  . Arthritis    BACK/KNEE  . Cataract    IMMATURE AND ON LEFT EYE SHE THINKS  . Chronic back pain   . Chronic back pain    L1 FRACTURE  . Depression    takes Wellbutrin daily  . Diabetes mellitus without complication ([place])    type II  . Dyspnea    with exertion  . Fibromyalgia   . GERD (gastroesophageal reflux disease)    takes Omeprazole daily and Zantac at bedtime  . H/O hiatal hernia   . History of colon polyps   . History of shingles   . Hyperlipidemia    takes Pravastatin daily as well as Questran  . Hypertension      takes Diovan daily  . Hypothyroidism   . Insomnia    takes trazodone nightly  . Joint pain   . Muscle spasm    takes Robaxin daily as needed  . Nausea    takes Zofran daily as needed  . Nocturia   . Numbness and tingling    LEGS  . Osteoporosis    takes fosamax  . Sleep apnea    uses cpap  . Thyroid disease    takes Synthroid daily    Past Surgical History:  Procedure Laterality Date  . ABDOMINAL HYSTERECTOMY    . CARDIAC CATHETERIZATION    . CARPAL TUNNEL RELEASE     right and lt  . CHOLECYSTECTOMY    . COLONOSCOPY    . COLONOSCOPY    . EYE SURGERY Bilateral    Cataract with lens  . FOOT SURGERY Bilateral   . FRACTURE SURGERY     rt hand.  no metal  . FRACTURE SURGERY     lr wrist. no metal  . HAND SURGERY Right 06/1985  . HAND SURGERY Left   . JOINT REPLACEMENT Right    THR  . KNEE ARTHROSCOPY Left   . KNEE ARTHROSCOPY Bilateral   . KYPHOPLASTY N/A 12/21/2014   Procedure: KYPHOPLASTY LUMBAR ONE;  Surgeon: Newman Pies, MD;  Location: [facility];  Service: Neurosurgery;  Laterality: N/A;  L1 Kyphoplasty  . ORIF FINGER FRACTURE  04/11/2012   Procedure: OPEN REDUCTION INTERNAL FIXATION (ORIF) METACARPAL (FINGER) FRACTURE;  Surgeon: Ninetta Lights, MD;  Location: [facility];  Service: Orthopedics;  Laterality: Left;  orif left hand 4th metacarpal   . 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 RECTOCELE REPAIR    . SHOULDER ARTHROSCOPY WITH DISTAL CLAVICLE RESECTION Right 05/20/2015   Procedure: SHOULDER ARTHROSCOPY WITH DISTAL CLAVICLE RESECTION DEBRIDEMENT PARTIAL ROTATOR CUFF LABRAL TEAR REMOVAL LOOSE BODIES;  Surgeon: Kathryne Hitch, MD;  Location: [facility];  Service: Orthopedics;  Laterality: Right;  . TONSILLECTOMY    . TOTAL HIP ARTHROPLASTY Right 10/18/2016   Procedure: TOTAL HIP ARTHROPLASTY ANTERIOR APPROACH;  Surgeon: Ninetta Lights, MD;  Location: [facility];  Service: Orthopedics;  Laterality: Right;  . TOTAL  KNEE ARTHROPLASTY Left 03/16/2020   Procedure: TOTAL KNEE ARTHROPLASTY;  Surgeon: Corky Mull, MD;  Location: [facility];  Service: Orthopedics;  Laterality: Left;  . UPPER GASTROINTESTINAL ENDOSCOPY  02/2013  . WRIST SURGERY Left      Current Outpatient Medications on File Prior to Visit  Medication Sig Dispense Refill  . acetaminophen (TYLENOL) 500 MG tablet Take 1 tablet (500 mg total) by mouth every 6 (six) hours as needed. 30 tablet 0  . buPROPion (WELLBUTRIN SR) 150 MG 12 hr tablet Take 150 mg by mouth daily.    . Calcium Carbonate-Vitamin D (CALCIUM 600/VITAMIN D PO) Take 1 tablet by mouth daily.    . citalopram (CELEXA) 40 MG tablet Take 40 mg by mouth daily. In the afternoon    . ferrous sulfate 325 (65 FE) MG tablet Take 325 mg by mouth daily with breakfast.    . fluticasone (FLONASE) 50 MCG/ACT nasal spray Place 2 sprays into the nose daily.    Marland Kitchen ibuprofen (ADVIL) 200 MG tablet Take 400 mg by mouth every 6 (six) hours as needed for moderate pain.    Marland Kitchen levothyroxine (SYNTHROID) 125 MCG tablet Take 125 mcg by mouth daily before breakfast.     . loratadine (CLARITIN) 10 MG tablet Take 10 mg by mouth daily. In the morning    . metFORMIN (GLUCOPHAGE-XR) 500 MG 24 hr tablet Take 500 mg by mouth every evening.  1  . Misc Natural Products (OSTEO BI-FLEX ADV JOINT SHIELD PO) Take 2 tablets by mouth daily.    . Omega-3 Fatty Acids (FISH OIL) 500 MG CAPS Take 500 mg by mouth daily.     Marland Kitchen omeprazole (PRILOSEC) 40 MG capsule Take 1 capsule (40 mg total) by mouth daily before breakfast. 30 capsule 11  . potassium chloride SA (K-DUR,KLOR-CON) 20 MEQ tablet Take 20 mEq by mouth 2 (two) times daily.    . pravastatin (PRAVACHOL) 20 MG tablet Take 20 mg by mouth at bedtime.     . predniSONE (DELTASONE) 10 MG tablet Take 2 tablets (20 mg total) by mouth daily. 15 tablet 0  . traMADol (ULTRAM) 50 MG tablet Take 1-2 tablets (50-100 mg total) by mouth every 6 (six) hours as needed for moderate pain. 40  tablet 0  . traZODone (DESYREL) 50 MG tablet Take 50 mg by mouth at bedtime.    . valsartan-hydrochlorothiazide (DIOVAN-HCT) 160-25 MG tablet Take 1 tablet by mouth daily.     Marland Kitchen HYDROcodone-acetaminophen (NORCO/VICODIN) 5-325 MG tablet Take 1-2 tablets by mouth every 6 (six) hours as needed.     No current facility-administered medications on file prior to visit.    Allergies  Allergen Reactions  . Sulfa Antibiotics Swelling    Face, throat, body  . Penicillins Itching    Has patient had a PCN reaction causing immediate rash,  facial/tongue/throat swelling, SOB or lightheadedness with hypotension: No Has patient had a PCN reaction causing severe rash involving mucus membranes or skin necrosis: No Has patient had a PCN reaction that required hospitalization No Has patient had a PCN reaction occurring within the last 10 years: No If all of the above answers are "NO", then may proceed with Cephalosporin use.     Physical exam:  Today's Vitals   08/19/20 1109  BP: (!) 150/89  Pulse: 72  Weight: 211 lb (95.7 kg)  Height: 4' 11.5" (1.511 m)   Body mass index is 41.9 kg/m.   Wt Readings from Last 3 Encounters:  08/19/20 211 lb (95.7 kg)  08/12/20 207 lb (93.9 kg)  02/16/20 222 lb (100.7 kg)     Ht Readings from Last 3 Encounters:  08/19/20 4' 11.5" (1.511 m)  08/12/20 4\' 11"  (1.499 m)  02/16/20 4' 11.5" (1.511 m)      General: The patient is awake, alert and appears not in acute distress. The patient is well groomed. Head: Normocephalic, atraumatic. Neck is supple. Mallampati 1 but laterally crowded and short. ,  neck circumference: 16.5  inches . Nasal airflow congested.  Dental status: poor Cardiovascular:  Regular rate and cardiac rhythm by pulse,  without distended neck veins. Respiratory: Lungs are clear to auscultation.  Skin:  Without evidence of ankle edema, or rash. Trunk: The patient's posture is erect.   Neurologic exam : The patient is awake and alert,  oriented to place and time.   Memory subjective described as intact.  Attention span & concentration ability appears normal.  Speech is fluent,  without dysarthria, but severe dysphonia .  Mood and affect are appropriate.   Cranial nerves: no loss of smell or taste reported -  Pupils are equal and briskly reactive to light. Funduscopic exam deferred.   Extraocular movements in vertical and horizontal planes were intact and without nystagmus. No Diplopia. Visual fields by finger perimetry are intact. Hearing was intact to soft voice and finger rubbing.    Facial sensation intact to fine touch.  Facial motor strength is symmetric and tongue and uvula move midline.  Neck ROM : rotation, tilt and flexion extension were normal for age and shoulder shrug was symmetrical.    Motor exam:  Symmetric bulk, tone and ROM.   Sensory:  Coordination: Rapid alternating movements in the fingers/hands were of normal speed.  The Finger-to-nose maneuver was intact without evidence of ataxia, dysmetria or tremor.   Deep tendon reflexes: in the  upper and lower extremities are symmetric and intact.  Babinski response was deferred .       After spending a total time of  45  minutes face to face and additional time for physical and neurologic examination, review of laboratory studies,  personal review of imaging studies, reports and results of other testing and review of referral information / records as far as provided in visit, I have established the following assessments:  1) given that the last sleep study was over 5 years ago I will be needing to repeat a sleep study I do not think that a home sleep test will do this patient any favors we need to see if there is still central apnea present as was the last time, I also need to have a look at her periodic limb movements.  Limb movements clearly originate from back and knee problems and she just had a knee replacement.  This chronic pain has been interrupting  her sleep  as well.  I would not be able to assess this in a home sleep test.  I do think there is a chronic depression present as the patient wakes as tired as when she went to bed and even after she takes naps.  She also snores through the CPAP machine especially when she ends up on her back so she is probably needing a full facemask.  My Plan is to proceed with:  1)PSG  SPLIT .   I would like to thank  Prince Solian, [address] [place],  [place] [address] for allowing me to meet with and to take care of this pleasant patient.   In short, CHASTIDY RANKER is  to follow up either personally or through our NP within 3 month.    Electronically signed by: Larey Seat, MD 08/19/2020 11:21 AM  [facility] Board certified by The [employer] of Sleep Medicine and Diplomate of the [employer] of Sleep Medicine. Board certified In Neurology through the [place], Fellow of the [employer] of Neurology. Medical Director of [facility].

## 2020-08-19 NOTE — Patient Instructions (Signed)

## 2020-08-23 ENCOUNTER — Ambulatory Visit (INDEPENDENT_AMBULATORY_CARE_PROVIDER_SITE_OTHER): Payer: Medicare Other | Admitting: Neurology

## 2020-08-23 DIAGNOSIS — G4736 Sleep related hypoventilation in conditions classified elsewhere: Secondary | ICD-10-CM

## 2020-08-23 DIAGNOSIS — G4761 Periodic limb movement disorder: Secondary | ICD-10-CM | POA: Diagnosis not present

## 2020-08-23 DIAGNOSIS — E66813 Obesity, class 3: Secondary | ICD-10-CM

## 2020-08-23 DIAGNOSIS — S32040D Wedge compression fracture of fourth lumbar vertebra, subsequent encounter for fracture with routine healing: Secondary | ICD-10-CM

## 2020-08-23 DIAGNOSIS — R0683 Snoring: Secondary | ICD-10-CM

## 2020-08-23 DIAGNOSIS — G478 Other sleep disorders: Secondary | ICD-10-CM

## 2020-09-03 DIAGNOSIS — R0683 Snoring: Secondary | ICD-10-CM | POA: Insufficient documentation

## 2020-09-03 DIAGNOSIS — G4761 Periodic limb movement disorder: Secondary | ICD-10-CM | POA: Insufficient documentation

## 2020-09-03 DIAGNOSIS — G478 Other sleep disorders: Secondary | ICD-10-CM | POA: Insufficient documentation

## 2020-09-03 NOTE — Addendum Note (Signed)
Addended by: Larey Seat on: 09/03/2020 01:28 PM   Modules accepted: Orders

## 2020-09-03 NOTE — Progress Notes (Signed)
IMPRESSION:   1. 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  2. Hypoxemia- lowest O2 saturation being 82%. Time spent below  89% saturation equaled 74 minutes.  3. Moderate Periodic Limb Movement Disorder (PLMD), clustered in  the first 2 hours of sleep, not seen in REM sleep.  4. Loud primary Snoring.  5. A high degree of Sleep fragmentation was noted.   RECOMMENDATIONS:   1. Advise full-night, attended, CPAP titration study to optimize  therapy. This patient may not benefit as much from apnea  reduction as from a reduction of sleep related hypoxia. If CPAP  can't lift the oxygen nadir, may need to add oxygen.  2. PLMs relate to some moderate sleep fragmentation in the first  half of the night.  3. Patient reported chronic pain being the cause of her frequent  arousals (hip, knee, shoulder, back).

## 2020-09-03 NOTE — Procedures (Signed)
PATIENT'S NAME:  [name], Kratt DOB:      Jan 30, 1946      MR#:    [id number]     DATE OF RECORDING: 08/23/2020 [facility]  REFERRING M.D.:  Michaell Cowing, MD Study Performed:   Baseline Polysomnogram HISTORY:  74 y.o. year old White or Caucasian female patient seen here upon a referral on 08/19/2020 Chief concern according to patient : " I am still very tired" on "CPAP and snoring' Her new fatigue severity score is 48 /63 points her Epworth sleepiness score is 16 / 24 again highly elevated. Was followed by [facility]- for CPAP. Dr. Dagmar Hait states that she would be presented to a routine annual evaluation at his office on July 20 she had some external stressors the health of her husband as well as her sister's husband dying of [place] in January 2021.  She feels very tired she has not been seen by Sleep-neurology in 5 years and is highly suspicious that her CPAP machine may need adjustment but actually needs replacement.     She has a history of a pituitary abnormality of frequent stool, chronic overactive bladder, urge incontinence, and a longstanding history of gastritis, hyperlipidemia, edema lower extremity edema obesity, obstructive sleep apnea GERD, CT abdomen and pelvis was possible lipoma small hiatal hernia, osteopenia since 2007 fibrocystic myofascial syndrome, is followed by Dr. 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  She is now followed by Dr. Michiel Sites, she has a carpal tunnel syndrome and hypothyroidism.    The patient endorsed the Epworth Sleepiness Scale at 15 points.  FSS at 48/63 points ( highly fatigued0  The patient's weight 212 pounds with a height of 60 (inches), resulting in a BMI of 41.6 kg/m2. The patient's neck circumference measured 16 inches.  CURRENT MEDICATIONS: Tylenol, Wellbutrin, Celexa, Flonase, Advil, Synthroid, Claritin, Metformin, Prilosec, Pravachol, Deltasone, Ultram, Desyrel, Diovan, Norco   PROCEDURE:  This is  a multichannel digital polysomnogram utilizing the Somnostar 11.2 system.  Electrodes and sensors were applied and monitored per AASM Specifications.   EEG, EOG, Chin and Limb EMG, were sampled at 200 Hz.  ECG, Snore and Nasal Pressure, Thermal Airflow, Respiratory Effort, CPAP Flow and Pressure, Oximetry was sampled at 50 Hz. Digital video and audio were recorded.      BASELINE STUDY: Lights Out was at 21:42 and Lights On at 04:59.  Total recording time (TRT) was 437.5 minutes, with a total sleep time (TST) of 349 minutes.   The patient's sleep latency was 44 minutes.  REM latency was 324 minutes.  The sleep efficiency was 79.8 %.     SLEEP ARCHITECTURE: WASO (Wake after sleep onset) was 70 minutes.  There were 38 minutes in Stage N1, 80.5 minutes Stage N2, 177 minutes Stage N3 and 53.5 minutes in Stage REM.  The percentage of Stage N1 was 10.9%, Stage N2 was 23.1%, Stage N3 was 50.7% and Stage R (REM sleep) was 15.3%.     RESPIRATORY ANALYSIS:  There were a total of 30 respiratory events:  10 obstructive apneas, 0 central apneas and 0 mixed apneas with a total of 10 apneas and an apnea index (AI) of 1.7 /hour. There were 20 hypopneas with a hypopnea index of 3.4 /hour. The patient also had few respiratory event related arousals (RERAs).      The total APNEA/HYPOPNEA INDEX (AHI) was 5.2/hour.  17 events occurred in REM sleep and 16 events in NREM. The REM AHI  was  19.1 /hour, versus a non-REM AHI of 2.6. The patient spent 109 minutes of total sleep time in the supine position and 240 minutes in non-supine. The supine AHI was 1.1/h versus a non-supine AHI of 7.0/h.  OXYGEN SATURATION & C02:  The Wake baseline 02 saturation was 92%, with the lowest being 82%. Time spent below 89% saturation equaled 74 minutes. The arousals were noted as: 62 were spontaneous, 14 were associated with PLMs, 4 were associated with respiratory events. The Periodic Limb Movement (PLM) Arousal index was 2.4/hour. The patient  was restless.  Audio and video analysis did not show any abnormal or unusual movements, behaviors, phonations or vocalizations.  Snoring was noted. EKG was in keeping with normal sinus rhythm (NSR).  IMPRESSION:  1. 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 2. Hypoxemia- lowest O2 saturation being 82%. Time spent below 89% saturation equaled 74 minutes. 3. Moderate Periodic Limb Movement Disorder (PLMD), clustered in the first 2 hours of sleep, not seen in REM sleep. 4. Loud primary Snoring. 5. A high degree of Sleep fragmentation was noted.   RECOMMENDATIONS:  1. Advise full-night, attended, CPAP titration study to optimize therapy. This patient may not benefit as much from apnea reduction as from a reduction of sleep related hypoxia.  If CPAP can't lift the oxygen nadir, may need to add oxygen. 2. PLMs relate to some moderate sleep fragmentation in the first half of the night.  3. Patient reported chronic pain being the cause of her frequent arousals (hip, knee, shoulder, back).    I certify that I have reviewed the entire raw data recording prior to the issuance of this report in accordance with the Standards of Accreditation of the American Academy of Sleep Medicine (AASM)   Larey Seat, MD Diplomat, American Board of Psychiatry and Neurology  Diplomat, American Board of Sleep Medicine Market researcher, [place] [facility] [employer]

## 2020-09-06 ENCOUNTER — Telehealth: Payer: Self-pay

## 2020-09-06 NOTE — Telephone Encounter (Signed)
I called pt. No answer, left a message asking pt to call me back.   

## 2020-09-07 NOTE — Telephone Encounter (Signed)
I called pt. I advised pt that Dr. Brett Fairy reviewed their sleep study results and found that osa was present and recommends that pt be treated with a cpap. Dr. Brett Fairy recommends that pt return for a repeat sleep study in order to properly titrate the cpap and ensure a good mask fit. Pt is agreeable to returning for a titration study. I advised pt that our sleep lab will file with pt's insurance and call pt to schedule the sleep study when we hear back from the pt's insurance regarding coverage of this sleep study. Pt verbalized understanding of results. Pt had no questions at this time but was encouraged to call back if questions arise.  ** Pt reports she is on vacation until 09/11/2020**

## 2020-09-12 ENCOUNTER — Other Ambulatory Visit: Payer: Self-pay

## 2020-09-12 ENCOUNTER — Ambulatory Visit
Admission: EM | Admit: 2020-09-12 | Discharge: 2020-09-12 | Disposition: A | Discharge status: Home / Self Care | Payer: Medicare Other | Attending: Emergency Medicine | Admitting: Emergency Medicine

## 2020-09-12 DIAGNOSIS — H01116 Allergic dermatitis of left eye, unspecified eyelid: Secondary | ICD-10-CM

## 2020-09-12 DIAGNOSIS — H01113 Allergic dermatitis of right eye, unspecified eyelid: Secondary | ICD-10-CM

## 2020-09-12 DIAGNOSIS — H02845 Edema of left lower eyelid: Secondary | ICD-10-CM

## 2020-09-12 MED ORDER — PREDNISONE 20 MG PO TABS: 20.0000 mg | ORAL_TABLET | Freq: Two times a day (BID) | ORAL | 0 refills | Status: AC

## 2020-09-12 MED ORDER — ERYTHROMYCIN 5 MG/GM OP OINT: TOPICAL_OINTMENT | OPHTHALMIC | 0 refills | Status: AC

## 2020-09-12 NOTE — Discharge Instructions (Signed)
Continue with OTC hydrocortisone once daily for a week if needed Prednisone prescribed for itching You may also use zyrtec, claritin or allegra during the day to help alleviating itching Prescribed erythromycin ointment for possible infection.  Apply up to 6 times daily for 5-7 days, or until symptomatic improvement Follow up with PCP or eye doctor if symptoms persists despite treatment Return or go to ER if you have any new or worsening symptoms such as fever, chills, redness, swelling, eye pain, painful eye movements, vision changes, etc..Marland Kitchen

## 2020-09-12 NOTE — ED Provider Notes (Signed)
[facility]   [id number] 09/12/20 Arrival Time: 50  CC: Red eye  SUBJECTIVE:  [name] is a 74 y.o. female who presents with complaint of eye redness and swelling x 4 days.  Symptoms began after [place]unscreen at the beach.  Has tried OTC hydrocortisone without relief.  Denies aggravating factors.  Denies similar symptoms in the past.  Denies fever, chills, nausea, vomiting, eye pain, painful eye movements,  discharge, vision changes, double vision, FB sensation.  Denies contact lens use.    ROS: As per HPI.  All other pertinent ROS negative.     Past Medical History:  Diagnosis Date  . Allergy    SEASONAL-flonase daily as well as Claritin  . Anemia    takes Ferrous Sulfate daily  . Anxiety    takes Celexa every evening  . Arthritis    BACK/KNEE  . Cataract    IMMATURE AND ON LEFT EYE SHE THINKS  . Chronic back pain   . Chronic back pain    L1 FRACTURE  . Depression    takes Wellbutrin daily  . Diabetes mellitus without complication ([place])    type II  . Dyspnea    with exertion  . Fibromyalgia   . GERD (gastroesophageal reflux disease)    takes Omeprazole daily and Zantac at bedtime  . H/O hiatal hernia   . History of colon polyps   . History of shingles   . Hyperlipidemia    takes Pravastatin daily as well as Questran  . Hypertension    takes Diovan daily  . Hypothyroidism   . Insomnia    takes trazodone nightly  . Joint pain   . Muscle spasm    takes Robaxin daily as needed  . Nausea    takes Zofran daily as needed  . Nocturia   . Numbness and tingling    LEGS  . Osteoporosis    takes fosamax  . Sleep apnea    uses cpap  . Thyroid disease    takes Synthroid daily   Past Surgical History:  Procedure Laterality Date  . ABDOMINAL HYSTERECTOMY    . CARDIAC CATHETERIZATION    . CARPAL TUNNEL RELEASE     right and lt  . CHOLECYSTECTOMY    . COLONOSCOPY    . COLONOSCOPY    . EYE SURGERY Bilateral    Cataract with lens  . FOOT  SURGERY Bilateral   . FRACTURE SURGERY     rt hand.  no metal  . FRACTURE SURGERY     lr wrist. no metal  . HAND SURGERY Right 06/1985  . HAND SURGERY Left   . JOINT REPLACEMENT Right    THR  . KNEE ARTHROSCOPY Left   . KNEE ARTHROSCOPY Bilateral   . KYPHOPLASTY N/A 12/21/2014   Procedure: KYPHOPLASTY LUMBAR ONE;  Surgeon: Newman Pies, MD;  Location: [facility];  Service: Neurosurgery;  Laterality: N/A;  L1 Kyphoplasty  . ORIF FINGER FRACTURE  04/11/2012   Procedure: OPEN REDUCTION INTERNAL FIXATION (ORIF) METACARPAL (FINGER) FRACTURE;  Surgeon: Ninetta Lights, MD;  Location: [facility];  Service: Orthopedics;  Laterality: Left;  orif left hand 4th metacarpal   . 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 RECTOCELE REPAIR    . SHOULDER ARTHROSCOPY WITH DISTAL CLAVICLE RESECTION Right 05/20/2015   Procedure: SHOULDER ARTHROSCOPY WITH DISTAL CLAVICLE RESECTION DEBRIDEMENT PARTIAL ROTATOR CUFF LABRAL TEAR REMOVAL LOOSE BODIES;  Surgeon: Kathryne Hitch, MD;  Location: [facility];  Service: Orthopedics;  Laterality: Right;  . TONSILLECTOMY    . TOTAL HIP ARTHROPLASTY Right 10/18/2016   Procedure: TOTAL HIP ARTHROPLASTY ANTERIOR APPROACH;  Surgeon: Ninetta Lights, MD;  Location: [facility];  Service: Orthopedics;  Laterality: Right;  . TOTAL KNEE ARTHROPLASTY Left 03/16/2020   Procedure: TOTAL KNEE ARTHROPLASTY;  Surgeon: Corky Mull, MD;  Location: [facility];  Service: Orthopedics;  Laterality: Left;  . UPPER GASTROINTESTINAL ENDOSCOPY  02/2013  . WRIST SURGERY Left    Allergies  Allergen Reactions  . Sulfa Antibiotics Swelling    Face, throat, body  . Penicillins Itching    Has patient had a PCN reaction causing immediate rash, facial/tongue/throat swelling, SOB or lightheadedness with hypotension: No Has patient had a PCN reaction causing severe rash involving mucus membranes or skin necrosis: No Has patient had a PCN reaction that required  hospitalization No Has patient had a PCN reaction occurring within the last 10 years: No If all of the above answers are "NO", then may proceed with Cephalosporin use.    No current facility-administered medications on file prior to encounter.   Current Outpatient Medications on File Prior to Encounter  Medication Sig Dispense Refill  . acetaminophen (TYLENOL) 500 MG tablet Take 1 tablet (500 mg total) by mouth every 6 (six) hours as needed. 30 tablet 0  . buPROPion (WELLBUTRIN SR) 150 MG 12 hr tablet Take 150 mg by mouth daily.    . Calcium Carbonate-Vitamin D (CALCIUM 600/VITAMIN D PO) Take 1 tablet by mouth daily.    . citalopram (CELEXA) 40 MG tablet Take 40 mg by mouth daily. In the afternoon    . ferrous sulfate 325 (65 FE) MG tablet Take 325 mg by mouth daily with breakfast.    . fluticasone (FLONASE) 50 MCG/ACT nasal spray Place 2 sprays into the nose daily.    Marland Kitchen HYDROcodone-acetaminophen (NORCO/VICODIN) 5-325 MG tablet Take 1-2 tablets by mouth every 6 (six) hours as needed.    Marland Kitchen ibuprofen (ADVIL) 200 MG tablet Take 400 mg by mouth every 6 (six) hours as needed for moderate pain.    Marland Kitchen levothyroxine (SYNTHROID) 125 MCG tablet Take 125 mcg by mouth daily before breakfast.     . loratadine (CLARITIN) 10 MG tablet Take 10 mg by mouth daily. In the morning    . metFORMIN (GLUCOPHAGE-XR) 500 MG 24 hr tablet Take 500 mg by mouth every evening.  1  . methocarbamol (ROBAXIN) 500 MG tablet Take 250 mg by mouth daily as needed for muscle spasms.    . Misc Natural Products (OSTEO BI-FLEX ADV JOINT SHIELD PO) Take 2 tablets by mouth daily.    . Omega-3 Fatty Acids (FISH OIL) 500 MG CAPS Take 500 mg by mouth daily.     Marland Kitchen omeprazole (PRILOSEC) 40 MG capsule Take 1 capsule (40 mg total) by mouth daily before breakfast. 30 capsule 11  . potassium chloride SA (K-DUR,KLOR-CON) 20 MEQ tablet Take 20 mEq by mouth 2 (two) times daily.    . pravastatin (PRAVACHOL) 20 MG tablet Take 20 mg by mouth at  bedtime.     . traMADol (ULTRAM) 50 MG tablet Take 1-2 tablets (50-100 mg total) by mouth every 6 (six) hours as needed for moderate pain. 40 tablet 0  . traZODone (DESYREL) 50 MG tablet Take 50 mg by mouth at bedtime.    . valsartan-hydrochlorothiazide (DIOVAN-HCT) 160-25 MG tablet Take 1 tablet by mouth daily.      Social History  Socioeconomic History  . Marital status: Married    Spouse name: [name]  . Number of children: 5  . Years of education: Not on file  . Highest education level: Not on file  Occupational History  . Occupation: retired  Tobacco Use  . Smoking status: Former Research scientist (life sciences)  . Smokeless tobacco: Never Used  . Tobacco comment: as a young erson, off and on , never had a habit  Vaping Use  . Vaping Use: Never used  Substance and Sexual Activity  . Alcohol use: No  . Drug use: No  . Sexual activity: Yes    Birth control/protection: Surgical  Other Topics Concern  . Not on file  Social History Narrative   Drinks 1 cup of coffee daily. 1 diet pepsi.   Lives with husband   Social Determinants of Health   Financial Resource Strain:   . Difficulty of Paying Living Expenses: Not on file  Food Insecurity:   . Worried About Charity fundraiser in the Last Year: Not on file  . Ran Out of Food in the Last Year: Not on file  Transportation Needs:   . Lack of Transportation (Medical): Not on file  . Lack of Transportation (Non-Medical): Not on file  Physical Activity:   . Days of Exercise per Week: Not on file  . Minutes of Exercise per Session: Not on file  Stress:   . Feeling of Stress : Not on file  Social Connections:   . Frequency of Communication with Friends and Family: Not on file  . Frequency of Social Gatherings with Friends and Family: Not on file  . Attends Religious Services: Not on file  . Active Member of Clubs or Organizations: Not on file  . Attends Archivist Meetings: Not on file  . Marital Status: Not on file  Intimate Partner  Violence:   . Fear of Current or Ex-Partner: Not on file  . Emotionally Abused: Not on file  . Physically Abused: Not on file  . Sexually Abused: Not on file   Family History  Problem Relation Age of Onset  . Hypertension Mother   . Diabetes Mother   . Cancer Maternal Grandfather        back  . Cancer Paternal Uncle   . Lung cancer Paternal Aunt   . Lung cancer Sister   . Heart attack Brother        x 2    OBJECTIVE:  Vitals:   09/12/20 1132  BP: (!) 146/81  Pulse: 85  Resp: 20  Temp: 98.8 F (37.1 C)  SpO2: 95%    General appearance: alert; no distress Eyes: periorbital swelling and erythema bilaterally; no conjunctival erythema. PERRL; EOMI without discomfort; no obvious drainage HENT: NCAT; nares patent; oropharynx clear Neck: supple Lungs: normal respiratory effort Skin: warm and dry Psychological: alert and cooperative; normal mood and affect   ASSESSMENT & PLAN:  1. Contact dermatitis of eyelids of both eyes   2. Pain and swelling of lower eyelid of both eyes     Meds ordered this encounter  Medications  . erythromycin ophthalmic ointment    Sig: Place a 1/2 inch ribbon of ointment into the both eyelids and around eyes.    Dispense:  3.5 g    Refill:  0    Order Specific Question:   Supervising Provider    Answer:   Raylene Everts [[id number]]  . predniSONE (DELTASONE) 20 MG tablet    Sig: Take 1 tablet (  20 mg total) by mouth 2 (two) times daily with a meal for 5 days.    Dispense:  10 tablet    Refill:  0    Order Specific Question:   Supervising Provider    Answer:   Raylene Everts [[id number]]   Continue with OTC hydrocortisone once daily for a week if needed Prednisone prescribed for itching You may also use zyrtec, claritin or allegra during the day to help alleviating itching Prescribed erythromycin ointment for possible infection.  Apply up to 6 times daily for 5-7 days, or until symptomatic improvement Follow up with PCP or eye doctor if  symptoms persists despite treatment Return or go to ER if you have any new or worsening symptoms such as fever, chills, redness, swelling, eye pain, painful eye movements, vision changes, etc...  Reviewed expectations re: course of current medical issues. Questions answered. Outlined signs and symptoms indicating need for more acute intervention. Patient verbalized understanding. After Visit Summary given.   Lestine Box, PA-C 09/12/20 1150

## 2020-09-12 NOTE — ED Triage Notes (Signed)
Pt presents with eyelids and face that is red and irritated, began after being at [facility]

## 2020-09-14 ENCOUNTER — Ambulatory Visit (INDEPENDENT_AMBULATORY_CARE_PROVIDER_SITE_OTHER): Payer: Medicare Other | Admitting: Neurology

## 2020-09-14 DIAGNOSIS — G478 Other sleep disorders: Secondary | ICD-10-CM

## 2020-09-14 DIAGNOSIS — G4761 Periodic limb movement disorder: Secondary | ICD-10-CM

## 2020-09-14 DIAGNOSIS — G4733 Obstructive sleep apnea (adult) (pediatric): Secondary | ICD-10-CM | POA: Diagnosis not present

## 2020-09-14 DIAGNOSIS — G4736 Sleep related hypoventilation in conditions classified elsewhere: Secondary | ICD-10-CM

## 2020-09-22 ENCOUNTER — Other Ambulatory Visit: Payer: Self-pay | Admitting: Family Medicine

## 2020-09-22 DIAGNOSIS — M810 Age-related osteoporosis without current pathological fracture: Secondary | ICD-10-CM

## 2020-09-24 DIAGNOSIS — G4736 Sleep related hypoventilation in conditions classified elsewhere: Secondary | ICD-10-CM | POA: Insufficient documentation

## 2020-09-24 NOTE — Procedures (Signed)
PATIENT'S NAME:  [name], [name] DOB:      10/21/1946      MR#:    [id number]     DATE OF RECORDING: 09/14/2020 REFERRING M.D.:  Prince Solian, MD Study Performed:   Titration to positive airway therapy HISTORY:  Mrs. HIYA POINT returns for an in-lab PAP titration following her PSG performed on 08/23/2020 which resulted in a diagnosis of mild, REM dependent OSA with an REM AHI of 19/h, overall AHI of 5.2 and Nadir SpO2 of 82%. The patient had experienced 74 minutes of sleep time with oxygen levels at or below 89%(!)>  Loud snoring and clusters of PLMs were noted, too. Since headaches and fatigue play a big role in her chief complaint, we decided to replace and re-titrate her CPAP therapy.  The patient is CPAP dependent, her machine is over 24 years old.   The patient endorsed the Epworth Sleepiness Scale at 15/24 points and the Fatigue Score at 48/63 points.  The patient's weight 212 pounds with a height of 60 (inches), resulting in a BMI of 41.6 kg/m2. The patient's neck circumference measured 16 inches.  CURRENT MEDICATIONS: Tylenol, Wellbutrin, Celexa, Flonase, Advil, Synthroid, Claritin, Metformin, Prilosec, Pravachol, Deltasone, Ultram, Desyrel, Diovan, Norco   PROCEDURE:  This is a multichannel digital polysomnogram utilizing the SomnoStar 11.2 system.  Electrodes and sensors were applied and monitored per AASM Specifications.   EEG, EOG, Chin and Limb EMG, were sampled at 200 Hz.  ECG, Snore and Nasal Pressure, Thermal Airflow, Respiratory Effort, CPAP Flow and Pressure, Oximetry was sampled at 50 Hz. Digital video and audio were recorded.      CPAP was initiated at 5 cmH20 with heated humidity per AASM split night standards and pressure was advanced to 11 cmH20 because of hypopneas, apneas and desaturations. The patient was fitted with a Respironics Dreamwear small FFM mask. Nasal masks and chin strap had failed. BiPAP attempt failed.  At CPAP pressures of 8,9, 10 and 11 cmH20, there was a  reduction of the AHI to 0 with improvement of sleep apnea.  Lights Out was at 20:54 and Lights On at 04:54. Total recording time (TRT) was 480.5 minutes, with a total sleep time (TST) of 344 minutes. The patient's sleep latency was 24.5 minutes. REM latency was 321 minutes.  The sleep efficiency was 71.6 %.    SLEEP ARCHITECTURE: WASO (Wake after sleep onset) was prolonged at 110 minutes.  There were 47 minutes in Stage N1, 89 minutes Stage N2, 173.5 minutes Stage N3 and 34.5 minutes in Stage REM.  The percentage of Stage N1 was 13.7%, Stage N2 was 25.9%, Stage N3 was 50.4% and Stage R (REM sleep) was 10.%. The sleep architecture was notable for less fragmentation than seen in baseline PSG.  RESPIRATORY ANALYSIS:  There was a total of 2 respiratory events: 0 obstructive apneas, 0 central apneas and 0 mixed apneas with a total of 0 apneas and an apnea index (AI) of 0 /hour. There were 2 hypopneas with a hypopnea index of 0.3/hour.   The total APNEA/HYPOPNEA INDEX  (AHI) was 0.3 /hour .  0 events occurred in REM sleep and 2 events in NREM. The REM AHI was 0 /hour versus a non-REM AHI of .4 /hour.  The patient spent 81 minutes of total sleep time in the supine position and 263 minutes in non-supine. The supine AHI was 0.7, versus a non-supine AHI of 0.2.  OXYGEN SATURATION & C02:  The baseline 02 saturation was 91%, with  the lowest being 85%. Time spent below 89% saturation equaled 35 minutes. The arousals were noted as: 83 were spontaneous, 0 were associated with PLMs, 1 was associated with respiratory event. The patient had a total of 0 Periodic Limb Movements.  Audio and video analysis did not show any abnormal or unusual movements, behaviors, phonations or vocalizations. Nocturia times one.  Loud Snoring was noted while on nasal pillows, nasal mask- change in masks allowed for control.  EKG was in keeping with normal sinus rhythm (NSR).   DIAGNOSIS 1. Primary Snoring and Obstructive Sleep Apnea  were controlled at CPAP pressures between 8 and 11 cm water. All were well tolerated under use of a Respironics Dreamwear FFM mask in small size.  2. All PAP pressures allowed for rise in oxygen saturation at 89% and above.    PLANS/RECOMMENDATIONS: Order will be for :  1. Auto CPAP device with heated humidification, a pressure width setting between 6 and 12 cm water with 2 cm water EPR, and a Respironics Dreamwear FFM in small size.   DISCUSSION: A follow up appointment will be scheduled in the Sleep Clinic at [facility].   Please call [phone] with any questions.      I certify that I have reviewed the entire raw data recording prior to the issuance of this report in accordance with the Standards of Accreditation of the American Academy of Sleep Medicine (AASM)  Larey Seat, M.D. Diplomat, Tax adviser of Psychiatry and Neurology  Diplomat, Tax adviser of Sleep Medicine Market researcher, [facility] at [employer]

## 2020-09-24 NOTE — Addendum Note (Signed)
Addended by: Larey Seat on: 09/24/2020 02:07 PM   Modules accepted: Orders

## 2020-09-24 NOTE — Progress Notes (Signed)
DIAGNOSIS  1. Primary Snoring and Obstructive Sleep Apnea were controlled at  CPAP pressures between 8 and 11 cm water. All were well tolerated  under use of a Respironics Dreamwear FFM mask in small size.  2. All PAP pressures allowed for rise in oxygen saturation at 89%  and above.    PLANS/RECOMMENDATIONS: Order will be for :  1. Auto CPAP device with heated humidification, a pressure width  setting between 6 and 12 cm water with 2 cm water EPR, and a  Respironics Dreamwear FFM in small size.   DISCUSSION: A follow up appointment will be scheduled in the  Sleep Clinic at [facility].  Please call  [phone] with any questions.

## 2020-09-27 ENCOUNTER — Telehealth: Payer: Self-pay | Admitting: Neurology

## 2020-09-27 NOTE — Telephone Encounter (Signed)
-----   Message from Larey Seat, MD sent at 09/24/2020  2:07 PM EDT ----- DIAGNOSIS  1. Primary Snoring and Obstructive Sleep Apnea were controlled at  CPAP pressures between 8 and 11 cm water. All were well tolerated  under use of a Respironics Dreamwear FFM mask in small size.  2. All PAP pressures allowed for rise in oxygen saturation at 89%  and above.    PLANS/RECOMMENDATIONS: Order will be for :  1. Auto CPAP device with heated humidification, a pressure width  setting between 6 and 12 cm water with 2 cm water EPR, and a  Respironics Dreamwear FFM in small size.   DISCUSSION: A follow up appointment will be scheduled in the  Sleep Clinic at [facility].  Please call  [phone] with any questions.

## 2020-09-27 NOTE — Telephone Encounter (Signed)
I called pt. I advised pt that Dr. Brett Fairy reviewed their sleep study results and found that pt was best treated with CPAP at a pressure of 11 cm. Dr. Brett Fairy recommends that pt starts auto CPAP 6-12 cm water pressure. I reviewed PAP compliance expectations with the pt. Pt is agreeable to starting a CPAP. I advised pt that an order will be sent to a DME, [facility] and [facility] ([place] will call the pt within about one week after they file with the pt's insurance. [facility]) will show the pt how to use the machine, fit for masks, and troubleshoot the CPAP if needed. A follow up appt was made for insurance purposes with Ward Givens, NP on 01/06/21 at 11 am. Pt verbalized understanding to arrive 15 minutes early and bring their CPAP. A letter with all of this information in it will be mailed to the pt as a reminder. I verified with the pt that the address we have on file is correct. Pt verbalized understanding of results. Pt had no questions at this time but was encouraged to call back if questions arise. I have sent the order to [facility]) and have received confirmation that they have received the order.

## 2020-10-01 ENCOUNTER — Encounter: Payer: Self-pay | Admitting: Gastroenterology

## 2020-10-01 ENCOUNTER — Ambulatory Visit: Payer: Medicare Other | Admitting: Gastroenterology

## 2020-10-01 VITALS — BP 130/74 | HR 79 | Ht 59.0 in | Wt 211.0 lb

## 2020-10-01 DIAGNOSIS — R1030 Lower abdominal pain, unspecified: Secondary | ICD-10-CM | POA: Diagnosis not present

## 2020-10-01 NOTE — Progress Notes (Signed)
Review of pertinent gastrointestinal problems: 1. Routine risk for CRC: Colonoscopy November 2004 by Dr. Lajoyce Corners; indications Hemoccult-positive stool, findings normal colon. Colonoscopy March 2006 Dr. Lajoyce Corners; indications "question of polyps" findings normal colon, no polyps. underwent stool testing by PCP in 2014 as part of routine physical and was positive so repeat colonoscopy 02/2013 was normal, no polyps.  She was recommended to have repeat screening in 10 years.  Colonoscopy August 2019 3 mm tubular adenoma removed. 2.  Over screened for colon cancer.  2019 and 2021 fecal occult blood testing despite colonoscopy results.  HPI: This is a very pleasant 74 year old woman  I last saw her in 2019.  She was doing quite well however she underwent colon cancer screening through her primary care physician despite being enrolled in colonoscopy surveillance program.  It was positive.  We had a nice discussion and I repeated her colonoscopy August 2019.  A 3 mm tubular adenoma was removed.  At that time the guideline recommendations were for repeat colonoscopy at 5-year interval.  Guidelines have changed in her new or interval would actually be 7 years.  She again underwent colon cancer screening through her primary care physician July 2021 and was positive for Hemoccult blood.  This was again part of a routine annual physical.  Several months ago she was having some lower abdominal pains.  They always occurred around the time of a bowel movement.  The pains have resolved.  She never saw any blood in her stool.  CBC August 2021 hemoglobin 15.  ROS: complete GI ROS as described in HPI, all other review negative.   Past Medical History:  Diagnosis Date  . Allergy    SEASONAL-flonase daily as well as Claritin  . Anemia    takes Ferrous Sulfate daily  . Anxiety    takes Celexa every evening  . Arthritis    BACK/KNEE  . Cataract    IMMATURE AND ON LEFT EYE SHE THINKS  . Chronic back pain   . Chronic  back pain    L1 FRACTURE  . Depression    takes Wellbutrin daily  . Diabetes mellitus without complication ([place])    type II  . Dyspnea    with exertion  . Fibromyalgia   . GERD (gastroesophageal reflux disease)    takes Omeprazole daily and Zantac at bedtime  . H/O hiatal hernia   . History of colon polyps   . History of shingles   . Hyperlipidemia    takes Pravastatin daily as well as Questran  . Hypertension    takes Diovan daily  . Hypothyroidism   . Insomnia    takes trazodone nightly  . Joint pain   . Muscle spasm    takes Robaxin daily as needed  . Nausea    takes Zofran daily as needed  . Nocturia   . Numbness and tingling    LEGS  . Osteoporosis    takes fosamax  . Sleep apnea    uses cpap  . Thyroid disease    takes Synthroid daily    Past Surgical History:  Procedure Laterality Date  . ABDOMINAL HYSTERECTOMY    . CARDIAC CATHETERIZATION    . CARPAL TUNNEL RELEASE     right and lt  . CHOLECYSTECTOMY    . COLONOSCOPY    . COLONOSCOPY    . EYE SURGERY Bilateral    Cataract with lens  . FOOT SURGERY Bilateral   . FRACTURE SURGERY     rt hand.  no metal  . FRACTURE SURGERY     lr wrist. no metal  . HAND SURGERY Right 06/1985  . HAND SURGERY Left   . JOINT REPLACEMENT Right    THR  . KNEE ARTHROSCOPY Left   . KNEE ARTHROSCOPY Bilateral   . KYPHOPLASTY N/A 12/21/2014   Procedure: KYPHOPLASTY LUMBAR ONE;  Surgeon: Newman Pies, MD;  Location: [place] [facility];  Service: Neurosurgery;  Laterality: N/A;  L1 Kyphoplasty  . ORIF FINGER FRACTURE  04/11/2012   Procedure: OPEN REDUCTION INTERNAL FIXATION (ORIF) METACARPAL (FINGER) FRACTURE;  Surgeon: Ninetta Lights, MD;  Location: [facility];  Service: Orthopedics;  Laterality: Left;  orif left hand 4th metacarpal   . 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 RECTOCELE REPAIR    . SHOULDER ARTHROSCOPY WITH DISTAL CLAVICLE RESECTION Right 05/20/2015   Procedure: SHOULDER  ARTHROSCOPY WITH DISTAL CLAVICLE RESECTION DEBRIDEMENT PARTIAL ROTATOR CUFF LABRAL TEAR REMOVAL LOOSE BODIES;  Surgeon: Kathryne Hitch, MD;  Location: [place];  Service: Orthopedics;  Laterality: Right;  . TONSILLECTOMY    . TOTAL HIP ARTHROPLASTY Right 10/18/2016   Procedure: TOTAL HIP ARTHROPLASTY ANTERIOR APPROACH;  Surgeon: Ninetta Lights, MD;  Location: [place];  Service: Orthopedics;  Laterality: Right;  . TOTAL KNEE ARTHROPLASTY Left 03/16/2020   Procedure: TOTAL KNEE ARTHROPLASTY;  Surgeon: Corky Mull, MD;  Location: [facility];  Service: Orthopedics;  Laterality: Left;  . UPPER GASTROINTESTINAL ENDOSCOPY  02/2013  . WRIST SURGERY Left     Current Outpatient Medications  Medication Sig Dispense Refill  . acetaminophen (TYLENOL) 500 MG tablet Take 1 tablet (500 mg total) by mouth every 6 (six) hours as needed. 30 tablet 0  . buPROPion (WELLBUTRIN SR) 150 MG 12 hr tablet Take 150 mg by mouth daily.    . Calcium Carbonate-Vitamin D (CALCIUM 600/VITAMIN D PO) Take 1 tablet by mouth daily.    . citalopram (CELEXA) 40 MG tablet Take 40 mg by mouth daily. In the afternoon    . erythromycin ophthalmic ointment Place a 1/2 inch ribbon of ointment into the both eyelids and around eyes. 3.5 g 0  . ferrous sulfate 325 (65 FE) MG tablet Take 325 mg by mouth daily with breakfast.    . fluticasone (FLONASE) 50 MCG/ACT nasal spray Place 2 sprays into the nose daily.    Marland Kitchen HYDROcodone-acetaminophen (NORCO/VICODIN) 5-325 MG tablet Take 1-2 tablets by mouth every 6 (six) hours as needed.    Marland Kitchen ibuprofen (ADVIL) 200 MG tablet Take 400 mg by mouth every 6 (six) hours as needed for moderate pain.    Marland Kitchen levothyroxine (SYNTHROID) 125 MCG tablet Take 125 mcg by mouth daily before breakfast.     . loratadine (CLARITIN) 10 MG tablet Take 10 mg by mouth daily. In the morning    . metFORMIN (GLUCOPHAGE-XR) 500 MG 24 hr tablet Take 500 mg by mouth every evening.  1  . methocarbamol (ROBAXIN) 500 MG  tablet Take 250 mg by mouth daily as needed for muscle spasms.    . Misc Natural Products (OSTEO BI-FLEX ADV JOINT SHIELD PO) Take 2 tablets by mouth daily.    . Omega-3 Fatty Acids (FISH OIL) 500 MG CAPS Take 500 mg by mouth daily.     Marland Kitchen omeprazole (PRILOSEC) 40 MG capsule Take 1 capsule (40 mg total) by mouth daily before breakfast. 30 capsule 11  . potassium chloride SA (K-DUR,KLOR-CON) 20 MEQ tablet Take 20 mEq by mouth 2 (two)  times daily.    . pravastatin (PRAVACHOL) 20 MG tablet Take 20 mg by mouth at bedtime.     . traMADol (ULTRAM) 50 MG tablet Take 1-2 tablets (50-100 mg total) by mouth every 6 (six) hours as needed for moderate pain. 40 tablet 0  . traZODone (DESYREL) 50 MG tablet Take 50 mg by mouth at bedtime.    . valsartan-hydrochlorothiazide (DIOVAN-HCT) 160-25 MG tablet Take 1 tablet by mouth daily.      No current facility-administered medications for this visit.    Allergies as of 10/01/2020 - Review Complete 10/01/2020  Allergen Reaction Noted  . Sulfa antibiotics Swelling 04/06/2012  . Penicillins Itching 04/06/2012    Family History  Problem Relation Age of Onset  . Hypertension Mother   . Diabetes Mother   . Cancer Maternal Grandfather        back  . Cancer Paternal Uncle   . Lung cancer Paternal Aunt   . Lung cancer Sister   . Heart attack Brother        x 2    Social History   Socioeconomic History  . Marital status: Married    Spouse name: [name]  . Number of children: 5  . Years of education: Not on file  . Highest education level: Not on file  Occupational History  . Occupation: retired  Tobacco Use  . Smoking status: Former Research scientist (life sciences)  . Smokeless tobacco: Never Used  . Tobacco comment: as a young erson, off and on , never had a habit  Vaping Use  . Vaping Use: Never used  Substance and Sexual Activity  . Alcohol use: No  . Drug use: No  . Sexual activity: Yes    Birth control/protection: Surgical  Other Topics Concern  . Not on file   Social History Narrative   Drinks 1 cup of coffee daily. 1 diet pepsi.   Lives with husband   Social Determinants of Health   Financial Resource Strain:   . Difficulty of Paying Living Expenses: Not on file  Food Insecurity:   . Worried About Charity fundraiser in the Last Year: Not on file  . Ran Out of Food in the Last Year: Not on file  Transportation Needs:   . Lack of Transportation (Medical): Not on file  . Lack of Transportation (Non-Medical): Not on file  Physical Activity:   . Days of Exercise per Week: Not on file  . Minutes of Exercise per Session: Not on file  Stress:   . Feeling of Stress : Not on file  Social Connections:   . Frequency of Communication with Friends and Family: Not on file  . Frequency of Social Gatherings with Friends and Family: Not on file  . Attends Religious Services: Not on file  . Active Member of Clubs or Organizations: Not on file  . Attends Archivist Meetings: Not on file  . Marital Status: Not on file  Intimate Partner Violence:   . Fear of Current or Ex-Partner: Not on file  . Emotionally Abused: Not on file  . Physically Abused: Not on file  . Sexually Abused: Not on file     Physical Exam: Ht 4\' 11"  (1.499 m)   Wt 211 lb (95.7 kg)   BMI 42.62 kg/m  Constitutional: generally well-appearing Psychiatric: alert and oriented x3 Abdomen: soft, nontender, nondistended, no obvious ascites, no peritoneal signs, normal bowel sounds No peripheral edema noted in lower extremities  Assessment and plan: 74 y.o. female with  improved lower abdominal pains, Hemoccult positive over screening  First the lower abdominal pains which were bothering her several months ago have completely improved.  They generally happened around the time of bowel movements and so most likely this is a functional pain.  I do not think she needs any testing for it.  She was Hemoccult positive, checked by her primary care physician again 2 or 3 months  ago despite being enrolled in a colonoscopy surveillance program.  She does not need colon cancer screening by Hemoccult testing.  I explained this to her 2 or 3 years ago and have been explained to her again.  I recommended that she see simply politely declined to do those tests in the future.  Please see the "Patient Instructions" section for addition details about the plan.  Owens Loffler, MD [facility] 10/01/2020, 9:09 AM   Total time on date of encounter was 30 minutes (this included time spent preparing to see the patient reviewing records; obtaining and/or reviewing separately obtained history; performing a medically appropriate exam and/or evaluation; counseling and educating the patient and family if present; ordering medications, tests or procedures if applicable; and documenting clinical information in the health record).

## 2020-10-01 NOTE — Patient Instructions (Addendum)
If you are age 74 or older, your body mass index should be between 23-30. Your Body mass index is 42.62 kg/m. If this is out of the aforementioned range listed, please consider follow up with your Primary Care Provider.  If you are age 2 or younger, your body mass index should be between 19-25. Your Body mass index is 42.62 kg/m. If this is out of the aformentioned range listed, please consider follow up with your Primary Care Provider.   You will follow up in our office if symptoms worsen or fail to improve.  Please contact our office if lower abdominal pain returns.   Thank you for entrusting me with your care and choosing [facility].  Dr Ardis Hughs

## 2020-10-11 NOTE — Telephone Encounter (Signed)
Order was sent 10/4 to the company. They do have the order. I have forwarded a message to [employer] this afternoon as well as contacted their manager. Someone will be in contact with the patient.

## 2020-10-11 NOTE — Telephone Encounter (Signed)
Pt has called to report that [employer] has informed her that they have not received the order for her CPAP from Dr Brett Fairy, please call.

## 2020-10-14 ENCOUNTER — Telehealth: Payer: Self-pay | Admitting: Neurology

## 2020-10-14 NOTE — Telephone Encounter (Signed)
error 

## 2020-10-14 NOTE — Telephone Encounter (Signed)
Called the patient back to advise that [employer] does in fax have their order. Advised what I can do is try sending her to another company that I have heard is getting the patient's in sooner. The patient is desperate to get set up so she can get better sleep.order has been forwarded to [facility]. Gave the patient [facility] number. [phone]. Pt verbalized understanding and will contact them if she has not heard from them.

## 2020-10-14 NOTE — Telephone Encounter (Signed)
Pt had spoken with [employer], was informed that they do not have an order for her. I [name]) inform Pt of the last note order was sent 10/4. Ask Pt to call [employer] again.

## 2021-01-06 ENCOUNTER — Ambulatory Visit: Payer: Medicare Other | Admitting: Adult Health

## 2021-01-06 ENCOUNTER — Encounter: Payer: Self-pay | Admitting: Adult Health

## 2021-01-06 VITALS — BP 144/86 | HR 85 | Ht 59.0 in | Wt 218.0 lb

## 2021-01-06 DIAGNOSIS — Z9989 Dependence on other enabling machines and devices: Secondary | ICD-10-CM | POA: Diagnosis not present

## 2021-01-06 DIAGNOSIS — G4733 Obstructive sleep apnea (adult) (pediatric): Secondary | ICD-10-CM

## 2021-01-06 NOTE — Progress Notes (Signed)
PATIENT: [name] DOB: [birth year]  REASON FOR VISIT: follow up HISTORY FROM: patient  HISTORY OF PRESENT ILLNESS: Today 01/06/21   [name] is a 75 year old female with a history of obstructive sleep apnea on CPAP.  Her download indicates that she used her machine nightly for compliance of 100%.  She used her machine greater than 4 hours each night.  On average she uses her machine 7 hours and 38 minutes.  Her residual AHI is 4.2 on 6 to 12 cm of water with EPR of 3.  Her leak in the 95th percentile is 29.1 L/min.  She reports that the CPAP is working well for her.  She does state that she is under a significant mount of stress as her husband has been diagnosed with cancer.  She returns today for an evaluation.   HISTORY (Copied from Dr.Dohmeier's note)  I have the pleasure of meeting [name] today, she has been a patient in our practice in 2016 at the time her main problem was snoring obesity she underwent a sleep study and was diagnosed with mild sleep apnea I "hear from her sleep study dated 08/10/2015.  Patient endorsed the Epworth Sleepiness Scale at 16 out of 24 points with a high degree of sleepiness and also endorses depression scale at 18 points.  BMI 44.7 at the time of this consultation.  The patient had 6 obstructive apneas 13 central and 40 hypopneas her AHI was 9.7.  RDI was 11.8.  REM accentuated the AHI to 13.3 but it was not REM dependent sleep apnea.  She had frequent periodic limb movements with an arousal index of 7.2/h.  I wrote at the time for a CPAP machine and she shows a [employer] a flex which is now 31 years old or older.  The machine provides an average AHI of 4/h now present expiratory pressure setting of 18 of an inspiratory pressure setting of 21 this is a BiPAP setting 21/18 a rather high pressure she did use a CPAP pressure as well and the auto CPAP mean pressure 90% of the time is 10.8 cmH2O not quite sure if the machine switches  between 2 functions were was recently altered.  She has been using the machine for 100% of the last 30 days and her average use at time is 9 hours 45 seconds.  The device apparently was set to 8 CPAP Ultraflex with a minimum pressure of 5 maximum pressure of 12 and to centimeter a flex setting this is what I had ordered originally.   She still feels like she does wake up just is exhausted as she was when to when she went to bed.  Her new fatigue severity score is 48 /63 points her Epworth sleepiness score is 16 / 24 again highly elevated.  Was followed by [facility]-   Dr. Dagmar Hait states that she would be presented to a routine annual evaluation at his office on July 20 she had some external stressors the health of her husband as well as her sister's husband dying of [place] in January.   She feels very tired she has not been seen by neurology in 5 years and is highly suspicious that her CPAP machine may need adjustment but actually improved with need replacement.    She has a history of a pituitary abnormality of frequent stool, chronic overactive bladder, urge incontinence, and a longstanding history of gastritis, hyperlipidemia, edema lower extremity edema obesity, obstructive sleep apnea GERD, CT abdomen  and pelvis was possible lipoma small hiatal hernia, osteopenia since 2007 fibrocystic myofascial syndrome, is followed by Dr. 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  She is now followed by Dr. Cleon GustinBallin, she has a carpal tunnel syndrome and hypothyroidism.  REVIEW OF SYSTEMS: Out of a complete 14 system review of symptoms, the patient complains only of the following symptoms, and all other reviewed systems are negative.  FSS 50 ESS 8  ALLERGIES: Allergies  Allergen Reactions  . Sulfa Antibiotics Swelling    Face, throat, body  . Penicillins Itching    Has patient had a PCN reaction causing immediate rash, facial/tongue/throat  swelling, SOB or lightheadedness with hypotension: No Has patient had a PCN reaction causing severe rash involving mucus membranes or skin necrosis: No Has patient had a PCN reaction that required hospitalization No Has patient had a PCN reaction occurring within the last 10 years: No If all of the above answers are "NO", then may proceed with Cephalosporin use.     HOME MEDICATIONS: Outpatient Medications Prior to Visit  Medication Sig Dispense Refill  . acetaminophen (TYLENOL) 500 MG tablet Take 1 tablet (500 mg total) by mouth every 6 (six) hours as needed. 30 tablet 0  . buPROPion (WELLBUTRIN SR) 150 MG 12 hr tablet Take 150 mg by mouth daily.    . Calcium Carbonate-Vitamin D (CALCIUM 600/VITAMIN D PO) Take 1 tablet by mouth daily.    . citalopram (CELEXA) 40 MG tablet Take 40 mg by mouth daily. In the afternoon    . erythromycin ophthalmic ointment Place a 1/2 inch ribbon of ointment into the both eyelids and around eyes. 3.5 g 0  . ferrous sulfate 325 (65 FE) MG tablet Take 325 mg by mouth daily with breakfast.    . fluticasone (FLONASE) 50 MCG/ACT nasal spray Place 2 sprays into the nose daily.    Marland Kitchen. HYDROcodone-acetaminophen (NORCO/VICODIN) 5-325 MG tablet Take 1-2 tablets by mouth every 6 (six) hours as needed.    Marland Kitchen. ibuprofen (ADVIL) 200 MG tablet Take 400 mg by mouth every 6 (six) hours as needed for moderate pain.    Marland Kitchen. levothyroxine (SYNTHROID) 125 MCG tablet Take 125 mcg by mouth daily before breakfast.     . loratadine (CLARITIN) 10 MG tablet Take 10 mg by mouth daily. In the morning    . metFORMIN (GLUCOPHAGE-XR) 500 MG 24 hr tablet Take 500 mg by mouth every evening.  1  . methocarbamol (ROBAXIN) 500 MG tablet Take 250 mg by mouth daily as needed for muscle spasms.    . Misc Natural Products (OSTEO BI-FLEX ADV JOINT SHIELD PO) Take 2 tablets by mouth daily.    . Omega-3 Fatty Acids (FISH OIL) 500 MG CAPS Take 500 mg by mouth daily.     Marland Kitchen. omeprazole (PRILOSEC) 40 MG capsule  Take 1 capsule (40 mg total) by mouth daily before breakfast. 30 capsule 11  . potassium chloride SA (K-DUR,KLOR-CON) 20 MEQ tablet Take 20 mEq by mouth 2 (two) times daily.    . pravastatin (PRAVACHOL) 20 MG tablet Take 20 mg by mouth at bedtime.     . traMADol (ULTRAM) 50 MG tablet Take 1-2 tablets (50-100 mg total) by mouth every 6 (six) hours as needed for moderate pain. 40 tablet 0  . traZODone (DESYREL) 50 MG tablet Take 50 mg by mouth at bedtime.    . valsartan-hydrochlorothiazide (DIOVAN-HCT) 160-25 MG tablet Take 1 tablet by mouth  daily.      No facility-administered medications prior to visit.    PAST MEDICAL HISTORY: Past Medical History:  Diagnosis Date  . Allergy    SEASONAL-flonase daily as well as Claritin  . Anemia    takes Ferrous Sulfate daily  . Anxiety    takes Celexa every evening  . Arthritis    BACK/KNEE  . Cataract    IMMATURE AND ON LEFT EYE SHE THINKS  . Chronic back pain   . Chronic back pain    L1 FRACTURE  . Depression    takes Wellbutrin daily  . Diabetes mellitus without complication ([place])    type II  . Dyspnea    with exertion  . Fibromyalgia   . GERD (gastroesophageal reflux disease)    takes Omeprazole daily and Zantac at bedtime  . H/O hiatal hernia   . History of colon polyps   . History of shingles   . Hyperlipidemia    takes Pravastatin daily as well as Questran  . Hypertension    takes Diovan daily  . Hypothyroidism   . Insomnia    takes trazodone nightly  . Joint pain   . Muscle spasm    takes Robaxin daily as needed  . Nausea    takes Zofran daily as needed  . Nocturia   . Numbness and tingling    LEGS  . Osteoporosis    takes fosamax  . Sleep apnea    uses cpap  . Thyroid disease    takes Synthroid daily    PAST SURGICAL HISTORY: Past Surgical History:  Procedure Laterality Date  . ABDOMINAL HYSTERECTOMY    . CARDIAC CATHETERIZATION    . CARPAL TUNNEL RELEASE     right and lt  . CHOLECYSTECTOMY    .  COLONOSCOPY    . COLONOSCOPY    . EYE SURGERY Bilateral    Cataract with lens  . FOOT SURGERY Bilateral   . FRACTURE SURGERY     rt hand.  no metal  . FRACTURE SURGERY     lr wrist. no metal  . HAND SURGERY Right 06/1985  . HAND SURGERY Left   . JOINT REPLACEMENT Right    THR  . KNEE ARTHROSCOPY Left   . KNEE ARTHROSCOPY Bilateral   . KYPHOPLASTY N/A 12/21/2014   Procedure: KYPHOPLASTY LUMBAR ONE;  Surgeon: Newman Pies, MD;  Location: [facility];  Service: Neurosurgery;  Laterality: N/A;  L1 Kyphoplasty  . ORIF FINGER FRACTURE  04/11/2012   Procedure: OPEN REDUCTION INTERNAL FIXATION (ORIF) METACARPAL (FINGER) FRACTURE;  Surgeon: Ninetta Lights, MD;  Location: [place];  Service: Orthopedics;  Laterality: Left;  orif left hand 4th metacarpal   . 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 RECTOCELE REPAIR    . SHOULDER ARTHROSCOPY WITH DISTAL CLAVICLE RESECTION Right 05/20/2015   Procedure: SHOULDER ARTHROSCOPY WITH DISTAL CLAVICLE RESECTION DEBRIDEMENT PARTIAL ROTATOR CUFF LABRAL TEAR REMOVAL LOOSE BODIES;  Surgeon: Kathryne Hitch, MD;  Location: [place];  Service: Orthopedics;  Laterality: Right;  . TONSILLECTOMY    . TOTAL HIP ARTHROPLASTY Right 10/18/2016   Procedure: TOTAL HIP ARTHROPLASTY ANTERIOR APPROACH;  Surgeon: Ninetta Lights, MD;  Location: [place];  Service: Orthopedics;  Laterality: Right;  . TOTAL KNEE ARTHROPLASTY Left 03/16/2020   Procedure: TOTAL KNEE ARTHROPLASTY;  Surgeon: Corky Mull, MD;  Location: [facility];  Service: Orthopedics;  Laterality: Left;  . UPPER GASTROINTESTINAL ENDOSCOPY  02/2013  .  WRIST SURGERY Left     FAMILY HISTORY: Family History  Problem Relation Age of Onset  . Hypertension Mother   . Diabetes Mother   . Cancer Maternal Grandfather        back  . Cancer Paternal Uncle   . Lung cancer Paternal Aunt   . Lung cancer Sister   . Heart attack Brother        x 2    SOCIAL  HISTORY: Social History   Socioeconomic History  . Marital status: Married    Spouse name: [name]  . Number of children: 5  . Years of education: Not on file  . Highest education level: Not on file  Occupational History  . Occupation: retired  Tobacco Use  . Smoking status: Former Research scientist (life sciences)  . Smokeless tobacco: Never Used  . Tobacco comment: as a young erson, off and on , never had a habit  Vaping Use  . Vaping Use: Never used  Substance and Sexual Activity  . Alcohol use: No  . Drug use: No  . Sexual activity: Yes    Birth control/protection: Surgical  Other Topics Concern  . Not on file  Social History Narrative   Drinks 1 cup of coffee daily. 1 diet pepsi.   Lives with husband   Social Determinants of Health   Financial Resource Strain: Not on file  Food Insecurity: Not on file  Transportation Needs: Not on file  Physical Activity: Not on file  Stress: Not on file  Social Connections: Not on file  Intimate Partner Violence: Not on file      PHYSICAL EXAM  Vitals:   01/06/21 1103  BP: (!) 144/86  Pulse: 85  Weight: 218 lb (98.9 kg)  Height: 4\' 11"  (1.499 m)   Body mass index is 44.03 kg/m.  Generalized: Well developed, in no acute distress  Chest: Lungs clear to auscultation bilaterally  Neurological examination  Mentation: Alert oriented to time, place, history taking. Follows all commands speech and language fluent Cranial nerve II-XII: Extraocular movements were full, visual field were full on confrontational test Head turning and shoulder shrug  were normal and symmetric. Motor: The motor testing reveals 5 over 5 strength of all 4 extremities. Good symmetric motor tone is noted throughout.  Sensory: Sensory testing is intact to soft touch on all 4 extremities. No evidence of extinction is noted.  Gait and station: Gait is normal.    DIAGNOSTIC DATA (LABS, IMAGING, TESTING) - I reviewed patient records, labs, notes, testing and imaging myself where  available.  Lab Results  Component Value Date   WBC 9.0 03/18/2020   HGB 12.6 03/18/2020   HCT 37.1 03/18/2020   MCV 91.2 03/18/2020   PLT 153 03/18/2020      Component Value Date/Time   NA 140 03/19/2020 0505   K 4.3 03/19/2020 0505   CL 102 03/19/2020 0505   CO2 30 03/19/2020 0505   GLUCOSE 108 (H) 03/19/2020 0505   BUN 13 03/19/2020 0505   CREATININE 0.69 03/19/2020 0505   CALCIUM 8.7 (L) 03/19/2020 0505   PROT 6.6 02/17/2020 0842   ALBUMIN 3.8 02/17/2020 0842   AST 29 02/17/2020 0842   ALT 24 02/17/2020 0842   ALKPHOS 58 02/17/2020 0842   BILITOT 1.0 02/17/2020 0842   GFRNONAA >60 03/19/2020 0505   GFRAA >60 03/19/2020 0505      ASSESSMENT AND PLAN 75 y.o. year old female  has a past medical history of Allergy, Anemia, Anxiety, Arthritis, Cataract, Chronic  back pain, Chronic back pain, Depression, Diabetes mellitus without complication ([place]), Dyspnea, Fibromyalgia, GERD (gastroesophageal reflux disease), H/O hiatal hernia, History of colon polyps, History of shingles, Hyperlipidemia, Hypertension, Hypothyroidism, Insomnia, Joint pain, Muscle spasm, Nausea, Nocturia, Numbness and tingling, Osteoporosis, Sleep apnea, and Thyroid disease. here with:  1. OSA on CPAP  - CPAP compliance excellent - Good treatment of AHI  - Encourage patient to use CPAP nightly and > 4 hours each night - F/U in 1 year or sooner if needed   I spent 20 minutes of face-to-face and non-face-to-face time with patient.  This included previsit chart review, lab review, study review, order entry, electronic health record documentation, patient education.  Ward Givens, MSN, NP-C 01/06/2021, 10:53 AM [facility] [address], [place], [place] [address] [phone]

## 2021-01-06 NOTE — Patient Instructions (Signed)

## 2021-01-20 ENCOUNTER — Other Ambulatory Visit: Payer: Medicare Other

## 2021-02-17 ENCOUNTER — Other Ambulatory Visit: Payer: Self-pay

## 2021-02-17 ENCOUNTER — Ambulatory Visit (INDEPENDENT_AMBULATORY_CARE_PROVIDER_SITE_OTHER): Payer: Medicare Other

## 2021-02-17 ENCOUNTER — Ambulatory Visit
Admission: EM | Admit: 2021-02-17 | Discharge: 2021-02-17 | Disposition: A | Discharge status: Home / Self Care | Payer: Medicare Other | Attending: Physician Assistant | Admitting: Physician Assistant

## 2021-02-17 DIAGNOSIS — W19XXXA Unspecified fall, initial encounter: Secondary | ICD-10-CM

## 2021-02-17 DIAGNOSIS — M25531 Pain in right wrist: Secondary | ICD-10-CM | POA: Diagnosis not present

## 2021-02-17 DIAGNOSIS — M25431 Effusion, right wrist: Secondary | ICD-10-CM

## 2021-02-17 DIAGNOSIS — S60211A Contusion of right wrist, initial encounter: Secondary | ICD-10-CM | POA: Diagnosis not present

## 2021-02-17 NOTE — ED Provider Notes (Signed)
[facility]    CSN: [id number] Arrival date & time: 02/17/21  1248      History   Chief Complaint No chief complaint on file.   HPI [name] is a 75 y.o. female.   The history is provided by the patient. No language interpreter was used.  Wrist Pain This is a new problem. The current episode started yesterday. The problem occurs constantly. The problem has been rapidly worsening. Nothing aggravates the symptoms. Nothing relieves the symptoms. She has tried nothing for the symptoms. The treatment provided no relief.  Pt injured her right wrist yesterday.  Pt complains of swelling and bruising  Past Medical History:  Diagnosis Date  . Allergy    SEASONAL-flonase daily as well as Claritin  . Anemia    takes Ferrous Sulfate daily  . Anxiety    takes Celexa every evening  . Arthritis    BACK/KNEE  . Cataract    IMMATURE AND ON LEFT EYE SHE THINKS  . Chronic back pain   . Chronic back pain    L1 FRACTURE  . Depression    takes Wellbutrin daily  . Diabetes mellitus without complication ([place])    type II  . Dyspnea    with exertion  . Fibromyalgia   . GERD (gastroesophageal reflux disease)    takes Omeprazole daily and Zantac at bedtime  . H/O hiatal hernia   . History of colon polyps   . History of shingles   . Hyperlipidemia    takes Pravastatin daily as well as Questran  . Hypertension    takes Diovan daily  . Hypothyroidism   . Insomnia    takes trazodone nightly  . Joint pain   . Muscle spasm    takes Robaxin daily as needed  . Nausea    takes Zofran daily as needed  . Nocturia   . Numbness and tingling    LEGS  . Osteoporosis    takes fosamax  . Sleep apnea    uses cpap  . Thyroid disease    takes Synthroid daily    Patient Active Problem List   Diagnosis Date Noted  . Comorbid sleep-related hypoventilation 09/24/2020  . Non-restorative sleep 09/03/2020  . Snoring 09/03/2020  . PLMD (periodic limb movement disorder) 09/03/2020   . 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 S/P total hip arthroplasty 10/18/2016  . Hypersomnia with sleep apnea 07/20/2015  . Hypersomnia, persistent 07/20/2015  . OSA (obstructive sleep apnea) 07/20/2015  . Lumbar compression fracture ([place]) 12/21/2014  . Class 3 severe obesity due to excess calories with serious comorbidity and body mass index (BMI) of 40.0 to 44.9 in adult ([place]) 03/07/2013  . Obstructive sleep apnea 03/07/2013  . Fibromyalgia 03/07/2013  . Other and unspecified hyperlipidemia 03/07/2013  . Hx of gastritis 03/07/2013  . S/P cholecystectomy 03/07/2013  . Chest pain 01/07/2013    Past Surgical History:  Procedure Laterality Date  . ABDOMINAL HYSTERECTOMY    . CARDIAC CATHETERIZATION    . CARPAL TUNNEL RELEASE     right and lt  . CHOLECYSTECTOMY    . COLONOSCOPY    . COLONOSCOPY    . EYE SURGERY Bilateral    Cataract with lens  . FOOT SURGERY Bilateral   . FRACTURE SURGERY     rt hand.  no metal  . FRACTURE SURGERY     lr wrist. no metal  . HAND SURGERY Right 06/1985  . HAND SURGERY Left   .  JOINT REPLACEMENT Right    THR  . KNEE ARTHROSCOPY Left   . KNEE ARTHROSCOPY Bilateral   . KYPHOPLASTY N/A 12/21/2014   Procedure: KYPHOPLASTY LUMBAR ONE;  Surgeon: Newman Pies, MD;  Location: [facility];  Service: Neurosurgery;  Laterality: N/A;  L1 Kyphoplasty  . ORIF FINGER FRACTURE  04/11/2012   Procedure: OPEN REDUCTION INTERNAL FIXATION (ORIF) METACARPAL (FINGER) FRACTURE;  Surgeon: Ninetta Lights, MD;  Location: [place];  Service: Orthopedics;  Laterality: Left;  orif left hand 4th metacarpal   . 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 RECTOCELE REPAIR    . SHOULDER ARTHROSCOPY WITH DISTAL CLAVICLE RESECTION Right 05/20/2015   Procedure: SHOULDER ARTHROSCOPY WITH DISTAL CLAVICLE RESECTION DEBRIDEMENT PARTIAL ROTATOR CUFF LABRAL TEAR REMOVAL LOOSE BODIES;  Surgeon: Kathryne Hitch, MD;  Location:  [place];  Service: Orthopedics;  Laterality: Right;  . TONSILLECTOMY    . TOTAL HIP ARTHROPLASTY Right 10/18/2016   Procedure: TOTAL HIP ARTHROPLASTY ANTERIOR APPROACH;  Surgeon: Ninetta Lights, MD;  Location: [place] [facility];  Service: Orthopedics;  Laterality: Right;  . TOTAL KNEE ARTHROPLASTY Left 03/16/2020   Procedure: TOTAL KNEE ARTHROPLASTY;  Surgeon: Corky Mull, MD;  Location: [facility];  Service: Orthopedics;  Laterality: Left;  . UPPER GASTROINTESTINAL ENDOSCOPY  02/2013  . WRIST SURGERY Left     OB History   No obstetric history on file.      Home Medications    Prior to Admission medications   Medication Sig Start Date End Date Taking? Authorizing Provider  acetaminophen (TYLENOL) 500 MG tablet Take 1 tablet (500 mg total) by mouth every 6 (six) hours as needed. 08/12/20   Avegno, Darrelyn Hillock, FNP  buPROPion (WELLBUTRIN SR) 150 MG 12 hr tablet Take 150 mg by mouth daily.    [provider]  Calcium Carbonate-Vitamin D (CALCIUM 600/VITAMIN D PO) Take 1 tablet by mouth daily.    [provider]  citalopram (CELEXA) 40 MG tablet Take 40 mg by mouth daily. In the afternoon    [provider]  erythromycin ophthalmic ointment Place a 1/2 inch ribbon of ointment into the both eyelids and around eyes. 09/12/20   Wurst, [place], PA-C  ferrous sulfate 325 (65 FE) MG tablet Take 325 mg by mouth daily with breakfast.    [provider]  fluticasone (FLONASE) 50 MCG/ACT nasal spray Place 2 sprays into the nose daily.    [provider]  HYDROcodone-acetaminophen (NORCO/VICODIN) 5-325 MG tablet Take 1-2 tablets by mouth every 6 (six) hours as needed. 07/01/20   [provider]  ibuprofen (ADVIL) 200 MG tablet Take 400 mg by mouth every 6 (six) hours as needed for moderate pain.    [provider]  levothyroxine (SYNTHROID) 125 MCG tablet Take 125 mcg by mouth daily before breakfast.     [provider]   loratadine (CLARITIN) 10 MG tablet Take 10 mg by mouth daily. In the morning    [provider]  metFORMIN (GLUCOPHAGE-XR) 500 MG 24 hr tablet Take 500 mg by mouth every evening. 09/26/16   [provider]  methocarbamol (ROBAXIN) 500 MG tablet Take 250 mg by mouth daily as needed for muscle spasms.    [provider]  Misc Natural Products (OSTEO BI-FLEX ADV JOINT SHIELD PO) Take 2 tablets by mouth daily.    [provider]  Omega-3 Fatty Acids (FISH OIL) 500 MG CAPS Take 500 mg by mouth daily.  [provider]  omeprazole (PRILOSEC) 40 MG capsule Take 1 capsule (40 mg total) by mouth daily before breakfast. 01/07/13   Milus Banister, MD  potassium chloride SA (K-DUR,KLOR-CON) 20 MEQ tablet Take 20 mEq by mouth 2 (two) times daily.    [provider]  pravastatin (PRAVACHOL) 20 MG tablet Take 20 mg by mouth at bedtime.     [provider]  traMADol (ULTRAM) 50 MG tablet Take 1-2 tablets (50-100 mg total) by mouth every 6 (six) hours as needed for moderate pain. 03/18/20   Lattie Corns, PA-C  traZODone (DESYREL) 50 MG tablet Take 50 mg by mouth at bedtime.    [provider]  valsartan-hydrochlorothiazide (DIOVAN-HCT) 160-25 MG tablet Take 1 tablet by mouth daily.     [provider]    Family History Family History  Problem Relation Age of Onset  . Hypertension Mother   . Diabetes Mother   . Cancer Maternal Grandfather        back  . Cancer Paternal Uncle   . Lung cancer Paternal Aunt   . Lung cancer Sister   . Heart attack Brother        x 2    Social History Social History   Tobacco Use  . Smoking status: Former Research scientist (life sciences)  . Smokeless tobacco: Never Used  . Tobacco comment: as a young erson, off and on , never had a habit  Vaping Use  . Vaping Use: Never used  Substance Use Topics  . Alcohol use: No  . Drug use: No     Allergies   Sulfa antibiotics and Penicillins   Review of  Systems Review of Systems  All other systems reviewed and are negative.    Physical Exam Triage Vital Signs ED Triage Vitals  Enc Vitals Group     BP 02/17/21 1318 (!) 172/85     Pulse Rate 02/17/21 1318 83     Resp 02/17/21 1318 20     Temp 02/17/21 1318 97.9 F (36.6 C)     Temp Source 02/17/21 1318 Tympanic     SpO2 02/17/21 1318 93 %     Weight --      Height --      Head Circumference --      Peak Flow --      Pain Score 02/17/21 1320 8     Pain Loc --      Pain Edu? --      Excl. in [place]? --    No data found.  Updated Vital Signs BP (!) 172/85 (BP Location: Left Wrist)   Pulse 83   Temp 97.9 F (36.6 C) (Tympanic)   Resp 20   SpO2 93%   Visual Acuity Right Eye Distance:   Left Eye Distance:   Bilateral Distance:    Right Eye Near:   Left Eye Near:    Bilateral Near:     Physical Exam Vitals and nursing note reviewed.  Constitutional:      Appearance: She is well-developed and well-nourished.  HENT:     Head: Normocephalic.  Eyes:     Extraocular Movements: EOM normal.  Pulmonary:     Effort: Pulmonary effort is normal.  Abdominal:     General: There is no distension.  Musculoskeletal:        General: Swelling and tenderness present. Normal range of motion.     Cervical back: Normal range of motion.     Comments: Swollen bruised palm and  wrist nv and ns intact  Skin:    General: Skin is warm.  Neurological:     General: No focal deficit present.     Mental Status: She is alert and oriented to person, place, and time.  Psychiatric:        Mood and Affect: Mood and affect and mood normal.      UC Treatments / Results  Labs (all labs ordered are listed, but only abnormal results are displayed) Labs Reviewed - No data to display  EKG   Radiology DG Wrist Complete Right  Result Date: 02/17/2021 CLINICAL DATA:  Fall last night. Right wrist pain and swelling. Initial encounter. EXAM: RIGHT WRIST - COMPLETE 3+ VIEW COMPARISON:  None.  FINDINGS: There is no evidence of fracture or dislocation. There is no evidence of arthropathy or other focal bone abnormality. Soft tissues are unremarkable. IMPRESSION: Negative. Electronically Signed   By: Marlaine Hind M.D.   On: 02/17/2021 13:55    Procedures Procedures (including critical care time)  Medications Ordered in UC Medications - No data to display  Initial Impression / Assessment and Plan / UC Course  I have reviewed the triage vital signs and the nursing notes.  Pertinent labs & imaging results that were available during my care of the patient were reviewed by me and considered in my medical decision making (see chart for details).     Pt has seen [place] hand in the past.  Pt advised to follow up with Dr. Fredna Dow for recheck in 1 week  Final Clinical Impressions(s) / UC Diagnoses   Final diagnoses:  Contusion of right wrist, initial encounter   Discharge Instructions   None    ED Prescriptions    None     PDMP not reviewed this encounter.  An After Visit Summary was printed and given to the patient.    Fransico Meadow, [place] 02/17/21 1404

## 2021-02-17 NOTE — Discharge Instructions (Addendum)
Schedule an appointment with Orthopaedist for recheck

## 2021-02-17 NOTE — ED Triage Notes (Signed)
Fell yesterday pain right hand and wrist pain

## 2021-04-29 ENCOUNTER — Ambulatory Visit: Payer: Medicare Other | Admitting: Podiatry

## 2021-05-03 ENCOUNTER — Ambulatory Visit: Payer: Medicare Other | Admitting: Podiatry

## 2021-05-03 ENCOUNTER — Ambulatory Visit (INDEPENDENT_AMBULATORY_CARE_PROVIDER_SITE_OTHER): Payer: Medicare Other

## 2021-05-03 ENCOUNTER — Other Ambulatory Visit: Payer: Self-pay

## 2021-05-03 ENCOUNTER — Other Ambulatory Visit: Payer: Self-pay | Admitting: Podiatry

## 2021-05-03 DIAGNOSIS — M778 Other enthesopathies, not elsewhere classified: Secondary | ICD-10-CM | POA: Diagnosis not present

## 2021-05-03 DIAGNOSIS — E039 Hypothyroidism, unspecified: Secondary | ICD-10-CM | POA: Insufficient documentation

## 2021-05-03 DIAGNOSIS — M25371 Other instability, right ankle: Secondary | ICD-10-CM | POA: Diagnosis not present

## 2021-05-03 DIAGNOSIS — R7301 Impaired fasting glucose: Secondary | ICD-10-CM | POA: Insufficient documentation

## 2021-05-03 DIAGNOSIS — M25571 Pain in right ankle and joints of right foot: Secondary | ICD-10-CM

## 2021-05-03 DIAGNOSIS — S99921A Unspecified injury of right foot, initial encounter: Secondary | ICD-10-CM

## 2021-05-03 DIAGNOSIS — M8448XA Pathological fracture, other site, initial encounter for fracture: Secondary | ICD-10-CM | POA: Insufficient documentation

## 2021-05-03 DIAGNOSIS — D443 Neoplasm of uncertain behavior of pituitary gland: Secondary | ICD-10-CM | POA: Insufficient documentation

## 2021-05-03 DIAGNOSIS — E559 Vitamin D deficiency, unspecified: Secondary | ICD-10-CM | POA: Insufficient documentation

## 2021-05-03 NOTE — Progress Notes (Signed)
  Subjective:  Patient ID: [name], female    DOB: 17-May-1946,  MRN: [id number]  Chief Complaint  Patient presents with  . Foot Injury    Rt foot injury x6 weeks ago, fell on foot. Pain/swelling in dorsal/side of foot. Staying constant/hard to walk. Tx: tylenol/voltaren.    75 y.o. female presents with the above complaint. History confirmed with patient.   Objective:  Physical Exam: warm, good capillary refill, no trophic changes or ulcerative lesions, normal DP and PT pulses and normal sensory exam.  Right Foot: POP right ATFL, 4th/5th TMT   No images are attached to the encounter.  Radiographs: X-ray of the right foot: no fracture, dislocation, swelling or degenerative changes noted  Assessment:   1. Ankle instability, right   2. Capsulitis of right foot   3. Pain in joint of right foot    Plan:  Patient was evaluated and treated and all questions answered.  Sprain right ATFL Pod Sprain: -XR taken and reviewed with patient -Educated on etiology of injury -ASO Brace dispensed  Return in about 1 month (around 06/03/2021) for Sprain f/u right.

## 2021-06-03 ENCOUNTER — Ambulatory Visit: Payer: Medicare Other | Admitting: Podiatry

## 2021-08-18 ENCOUNTER — Ambulatory Visit: Payer: Medicare Other | Admitting: Podiatry

## 2021-08-18 ENCOUNTER — Other Ambulatory Visit: Payer: Self-pay

## 2021-08-18 DIAGNOSIS — M79674 Pain in right toe(s): Secondary | ICD-10-CM

## 2021-08-18 DIAGNOSIS — L989 Disorder of the skin and subcutaneous tissue, unspecified: Secondary | ICD-10-CM | POA: Diagnosis not present

## 2021-08-24 NOTE — Progress Notes (Signed)
Subjective: 75 year old female presents the office today for concerns with corn, painful lesion on the right fifth toe.  Denies any swelling redness or drainage.  No open lesions.  No drainage.  No other concerns.  Objective: AAO x3, NAD DP/PT pulses palpable bilaterally, CRT less than 3 seconds Hyperkeratotic lesion on the right fifth toe.  There is no underlying ulceration drainage or any signs of infection noted.  No edema, erythema. No pain with calf compression, swelling, warmth, erythema  Assessment: Skin lesion right fifth toe  Plan: -All treatment options discussed with the patient including all alternatives, risks, complications.  -Sharp debrided the hyperkeratotic lesion with any complications or bleeding.  Discussed moisturizer and offloading daily.  Discussed shoe modifications avoid pressure. -Patient encouraged to call the office with any questions, concerns, change in symptoms.   Trula Slade DPM

## 2021-09-16 ENCOUNTER — Other Ambulatory Visit: Payer: Self-pay | Admitting: Internal Medicine

## 2021-09-16 DIAGNOSIS — Z1231 Encounter for screening mammogram for malignant neoplasm of breast: Secondary | ICD-10-CM

## 2021-11-03 ENCOUNTER — Other Ambulatory Visit: Payer: Self-pay

## 2021-11-03 ENCOUNTER — Ambulatory Visit
Admission: EM | Admit: 2021-11-03 | Discharge: 2021-11-03 | Disposition: A | Discharge status: Home / Self Care | Payer: Medicare Other | Attending: Family Medicine | Admitting: Family Medicine

## 2021-11-03 ENCOUNTER — Encounter: Payer: Self-pay | Admitting: Emergency Medicine

## 2021-11-03 DIAGNOSIS — M79671 Pain in right foot: Secondary | ICD-10-CM | POA: Diagnosis not present

## 2021-11-03 NOTE — ED Provider Notes (Signed)
[place]   [id number] 11/03/21 Arrival Time: 5170  ASSESSMENT & PLAN:  1. Right foot pain    No x-ray available here today. Declines outpt radiology at [facility]. Would not be surprised if she broke a toe or metatarsal. Discussed.  Prefers CAM walker for one week. May return if not improving and we will x-ray if available. OTC analgesics as needed.  Orders Placed This Encounter  Procedures   Apply CAM boot    Recommend:  Follow-up Information     Avva, Steva Ready, MD.   Specialty: Internal Medicine Why: If worsening or failing to improve as anticipated. Contact information: [address] [place] [address] [phone]                 Reviewed expectations re: course of current medical issues. Questions answered. Outlined signs and symptoms indicating need for more acute intervention. Patient verbalized understanding. After Visit Summary given.  SUBJECTIVE: History from: patient. [name] is a 75 y.o. female who reports fairly persistent moderate pain of her right distal foot and toes; described as aching; without radiation. Onset: abrupt. First noted: a week ago. Injury/trama: bare foot vs coffee table. Symptoms have progressed to a point and plateaued since beginning. Aggravating factors: certain movements and weight bearing. Alleviating factors: have not been identified. Associated symptoms: none reported. Extremity sensation changes or weakness: none. Self treatment:  Tylenol; not much help .  History of similar: no.  Past Surgical History:  Procedure Laterality Date   ABDOMINAL HYSTERECTOMY     CARDIAC CATHETERIZATION     CARPAL TUNNEL RELEASE     right and lt   CHOLECYSTECTOMY     COLONOSCOPY     COLONOSCOPY     EYE SURGERY Bilateral    Cataract with lens   FOOT SURGERY Bilateral    FRACTURE SURGERY     rt hand.  no metal   FRACTURE SURGERY     lr wrist. no metal   HAND SURGERY Right 06/1985   HAND SURGERY Left     JOINT REPLACEMENT Right    THR   KNEE ARTHROSCOPY Left    KNEE ARTHROSCOPY Bilateral    KYPHOPLASTY N/A 12/21/2014   Procedure: KYPHOPLASTY LUMBAR ONE;  Surgeon: Newman Pies, MD;  Location: [place] [facility];  Service: Neurosurgery;  Laterality: N/A;  L1 Kyphoplasty   ORIF FINGER FRACTURE  04/11/2012   Procedure: OPEN REDUCTION INTERNAL FIXATION (ORIF) METACARPAL (FINGER) FRACTURE;  Surgeon: Ninetta Lights, MD;  Location: [place];  Service: Orthopedics;  Laterality: Left;  orif left hand 4th metacarpal    PITUITARY SURGERY  07/2007   tumor resection, BENIGN   RECTOCELE REPAIR     SHOULDER ARTHROSCOPY WITH DISTAL CLAVICLE RESECTION Right 05/20/2015   Procedure: SHOULDER ARTHROSCOPY WITH DISTAL CLAVICLE RESECTION DEBRIDEMENT PARTIAL ROTATOR CUFF LABRAL TEAR REMOVAL LOOSE BODIES;  Surgeon: Kathryne Hitch, MD;  Location: [place];  Service: Orthopedics;  Laterality: Right;   TONSILLECTOMY     TOTAL HIP ARTHROPLASTY Right 10/18/2016   Procedure: TOTAL HIP ARTHROPLASTY ANTERIOR APPROACH;  Surgeon: Ninetta Lights, MD;  Location: [place];  Service: Orthopedics;  Laterality: Right;   TOTAL KNEE ARTHROPLASTY Left 03/16/2020   Procedure: TOTAL KNEE ARTHROPLASTY;  Surgeon: Corky Mull, MD;  Location: [facility];  Service: Orthopedics;  Laterality: Left;   UPPER GASTROINTESTINAL ENDOSCOPY  02/2013   WRIST SURGERY Left       OBJECTIVE:  Vitals:   11/03/21 1254 11/03/21 1255  BP: 125/75   Pulse: 91   Resp: 16   Temp: 98.1 F (36.7 C)   TempSrc: Oral   SpO2: (!) 89% 93%    General appearance: alert; no distress HEENT: [place]; AT Neck: supple with FROM Resp: unlabored respirations Extremities: RLE: warm with well perfused appearance; poorly localized moderate tenderness over right distal dorsal foot into lateral toes; without gross deformities; swelling: none; bruising: minimal around proximal toes CV: brisk extremity capillary refill of RLE; 2+ DP pulse of  RLE. Skin: warm and dry; no visible rashes Neurologic: gait normal; normal sensation and strength of RLE Psychological: alert and cooperative; normal mood and affect    Allergies  Allergen Reactions   Sulfa Antibiotics Swelling    Face, throat, body   Penicillins Itching    Has patient had a PCN reaction causing immediate rash, facial/tongue/throat swelling, SOB or lightheadedness with hypotension: No Has patient had a PCN reaction causing severe rash involving mucus membranes or skin necrosis: No Has patient had a PCN reaction that required hospitalization No Has patient had a PCN reaction occurring within the last 10 years: No If all of the above answers are "NO", then may proceed with Cephalosporin use.    Cyclobenzaprine     Other reaction(s): Other (See Comments), Unknown   Sulfamethoxazole-Trimethoprim     Other reaction(s): Other (See Comments), Unknown    Past Medical History:  Diagnosis Date   Allergy    SEASONAL-flonase daily as well as Claritin   Anemia    takes Ferrous Sulfate daily   Anxiety    takes Celexa every evening   Arthritis    BACK/KNEE   Cataract    IMMATURE AND ON LEFT EYE SHE THINKS   Chronic back pain    Chronic back pain    L1 FRACTURE   Depression    takes Wellbutrin daily   Diabetes mellitus without complication (HCC)    type II   Dyspnea    with exertion   Fibromyalgia    GERD (gastroesophageal reflux disease)    takes Omeprazole daily and Zantac at bedtime   H/O hiatal hernia    History of colon polyps    History of shingles    Hyperlipidemia    takes Pravastatin daily as well as Questran   Hypertension    takes Diovan daily   Hypothyroidism    Insomnia    takes trazodone nightly   Joint pain    Muscle spasm    takes Robaxin daily as needed   Nausea    takes Zofran daily as needed   Nocturia    Numbness and tingling    LEGS   Osteoporosis    takes fosamax   Sleep apnea    uses cpap   Thyroid disease    takes  Synthroid daily   Social History   Socioeconomic History   Marital status: Married    Spouse name: [name]   Number of children: 5   Years of education: Not on file   Highest education level: Not on file  Occupational History   Occupation: retired  Tobacco Use   Smoking status: Former   Smokeless tobacco: Never   Tobacco comments:    as a young erson, off and on , never had a habit  Vaping Use   Vaping Use: Never used  Substance and Sexual Activity   Alcohol use: No   Drug use: No   Sexual activity: Yes    Birth control/protection: Surgical  Other Topics  Concern   Not on file  Social History Narrative   Drinks 1 cup of coffee daily. 1 diet pepsi.   Lives with husband   Social Determinants of Health   Financial Resource Strain: Not on file  Food Insecurity: Not on file  Transportation Needs: Not on file  Physical Activity: Not on file  Stress: Not on file  Social Connections: Not on file   Family History  Problem Relation Age of Onset   Hypertension Mother    Diabetes Mother    Cancer Maternal Grandfather        back   Cancer Paternal Uncle    Lung cancer Paternal Aunt    Lung cancer Sister    Heart attack Brother        x 2   Past Surgical History:  Procedure Laterality Date   ABDOMINAL HYSTERECTOMY     CARDIAC CATHETERIZATION     CARPAL TUNNEL RELEASE     right and lt   CHOLECYSTECTOMY     COLONOSCOPY     COLONOSCOPY     EYE SURGERY Bilateral    Cataract with lens   FOOT SURGERY Bilateral    FRACTURE SURGERY     rt hand.  no metal   FRACTURE SURGERY     lr wrist. no metal   HAND SURGERY Right 06/1985   HAND SURGERY Left    JOINT REPLACEMENT Right    THR   KNEE ARTHROSCOPY Left    KNEE ARTHROSCOPY Bilateral    KYPHOPLASTY N/A 12/21/2014   Procedure: KYPHOPLASTY LUMBAR ONE;  Surgeon: Newman Pies, MD;  Location: [place] [facility];  Service: Neurosurgery;  Laterality: N/A;  L1 Kyphoplasty   ORIF FINGER FRACTURE  04/11/2012   Procedure: OPEN REDUCTION  INTERNAL FIXATION (ORIF) METACARPAL (FINGER) FRACTURE;  Surgeon: Ninetta Lights, MD;  Location: [place];  Service: Orthopedics;  Laterality: Left;  orif left hand 4th metacarpal    PITUITARY SURGERY  07/2007   tumor resection, BENIGN   RECTOCELE REPAIR     SHOULDER ARTHROSCOPY WITH DISTAL CLAVICLE RESECTION Right 05/20/2015   Procedure: SHOULDER ARTHROSCOPY WITH DISTAL CLAVICLE RESECTION DEBRIDEMENT PARTIAL ROTATOR CUFF LABRAL TEAR REMOVAL LOOSE BODIES;  Surgeon: Kathryne Hitch, MD;  Location: [place];  Service: Orthopedics;  Laterality: Right;   TONSILLECTOMY     TOTAL HIP ARTHROPLASTY Right 10/18/2016   Procedure: TOTAL HIP ARTHROPLASTY ANTERIOR APPROACH;  Surgeon: Ninetta Lights, MD;  Location: [place];  Service: Orthopedics;  Laterality: Right;   TOTAL KNEE ARTHROPLASTY Left 03/16/2020   Procedure: TOTAL KNEE ARTHROPLASTY;  Surgeon: Corky Mull, MD;  Location: [facility];  Service: Orthopedics;  Laterality: Left;   UPPER GASTROINTESTINAL ENDOSCOPY  02/2013   WRIST SURGERY Left        Vanessa Kick, MD 11/03/21 1342

## 2021-11-03 NOTE — ED Triage Notes (Signed)
Patient c/o RT foot trauma x 1 week.   Patient endorses " I hit my foot on the corner of my coffee table".   Patient endorses swelling.   Patient endorses increased pain with ambulation.   Patient endorses previous foot surgery.   Patient has taken Tylenol with no relief of symptoms.

## 2021-12-06 IMAGING — DX DG WRIST COMPLETE 3+V*R*
3 series · 3 of 3 positions shown · non-contrast
Comparison: None.

CLINICAL DATA: Fall last night. Right wrist pain and swelling.
Initial encounter.

EXAM:
RIGHT WRIST - COMPLETE 3+ VIEW

[wrist pa]
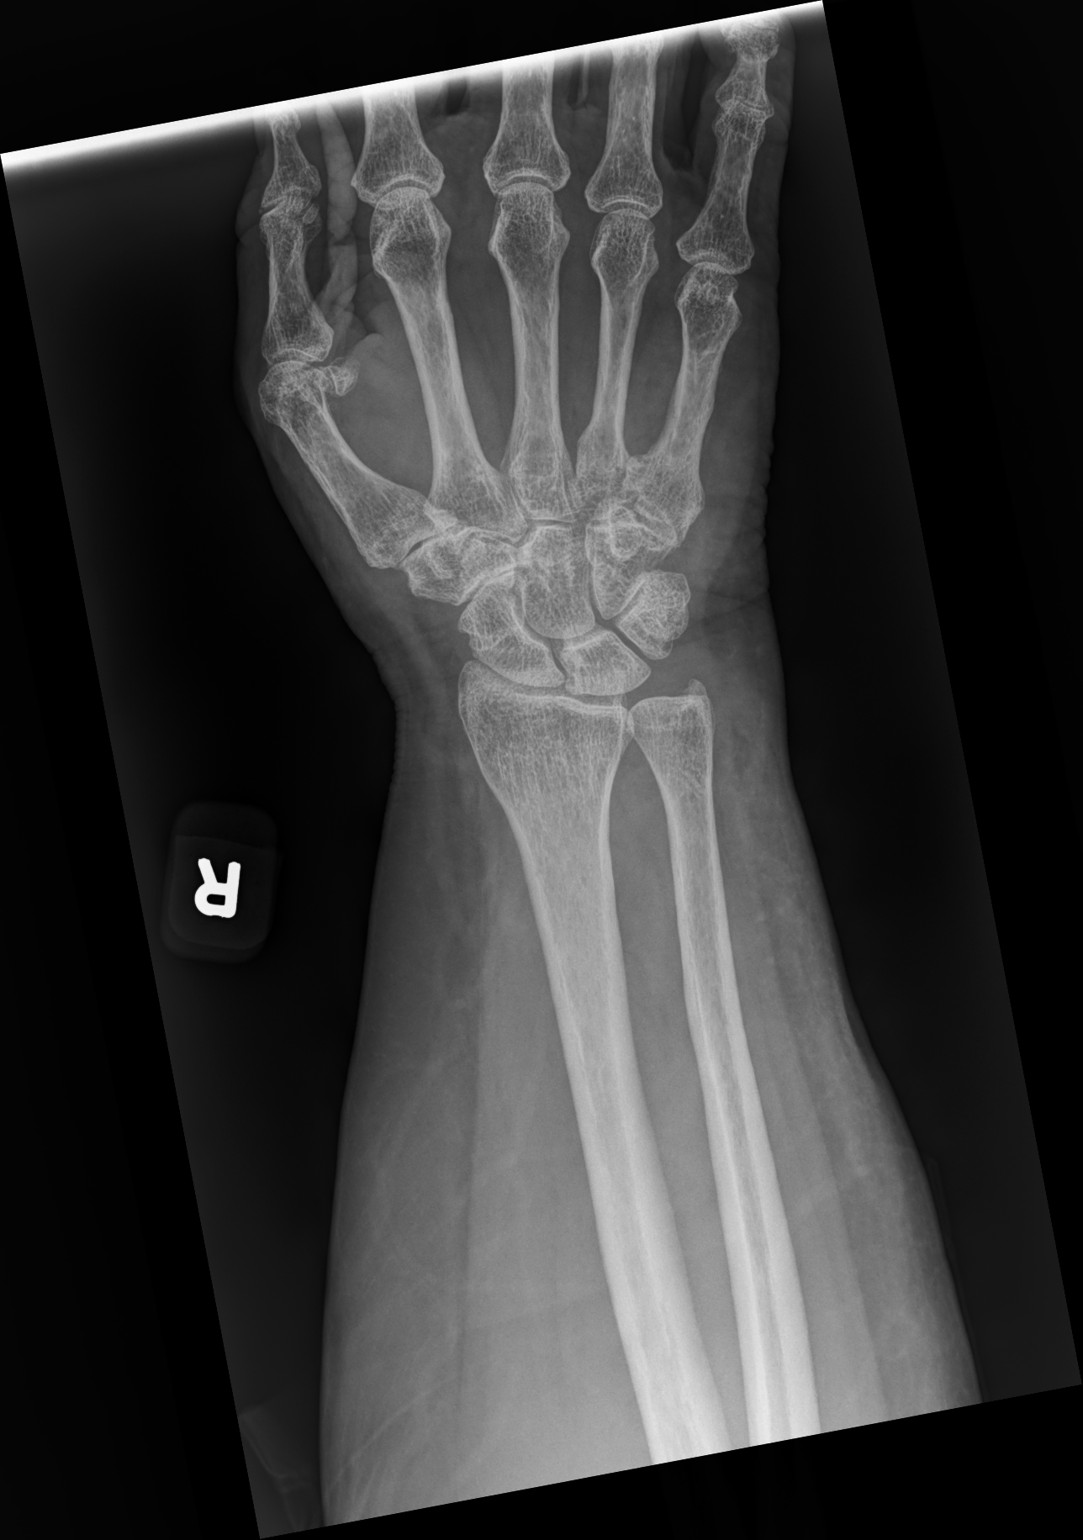

[wrist mlo]
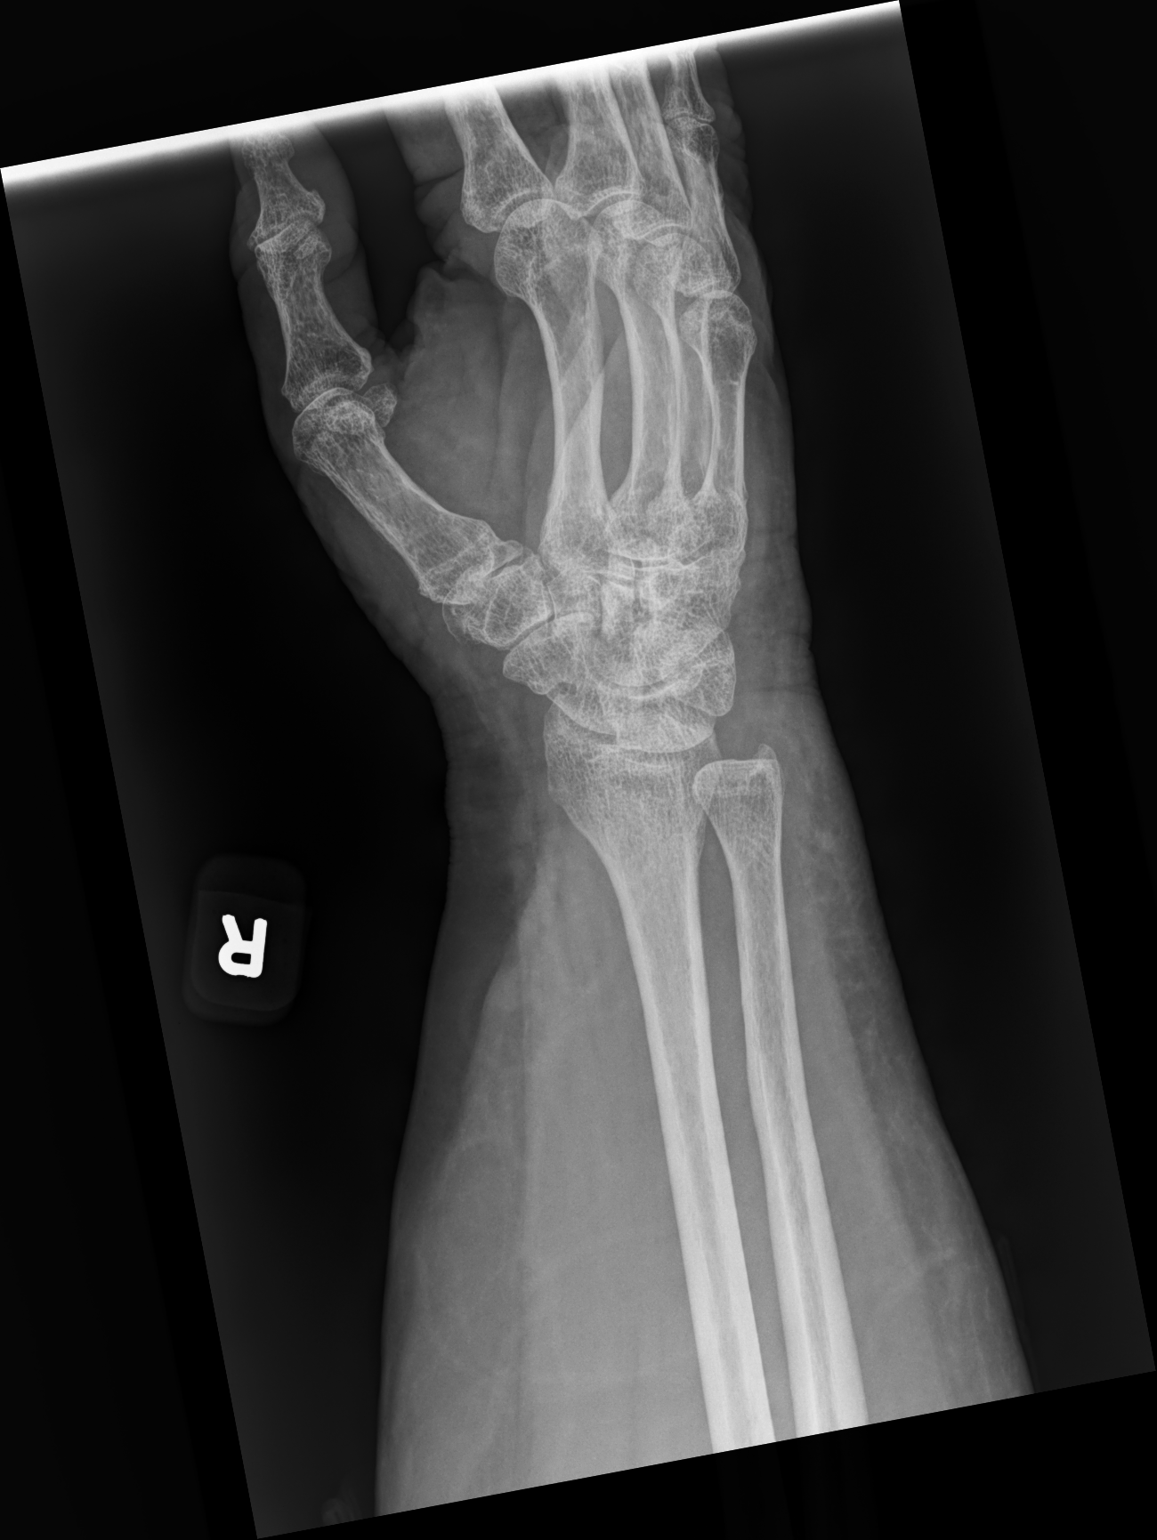

[wrist lat]
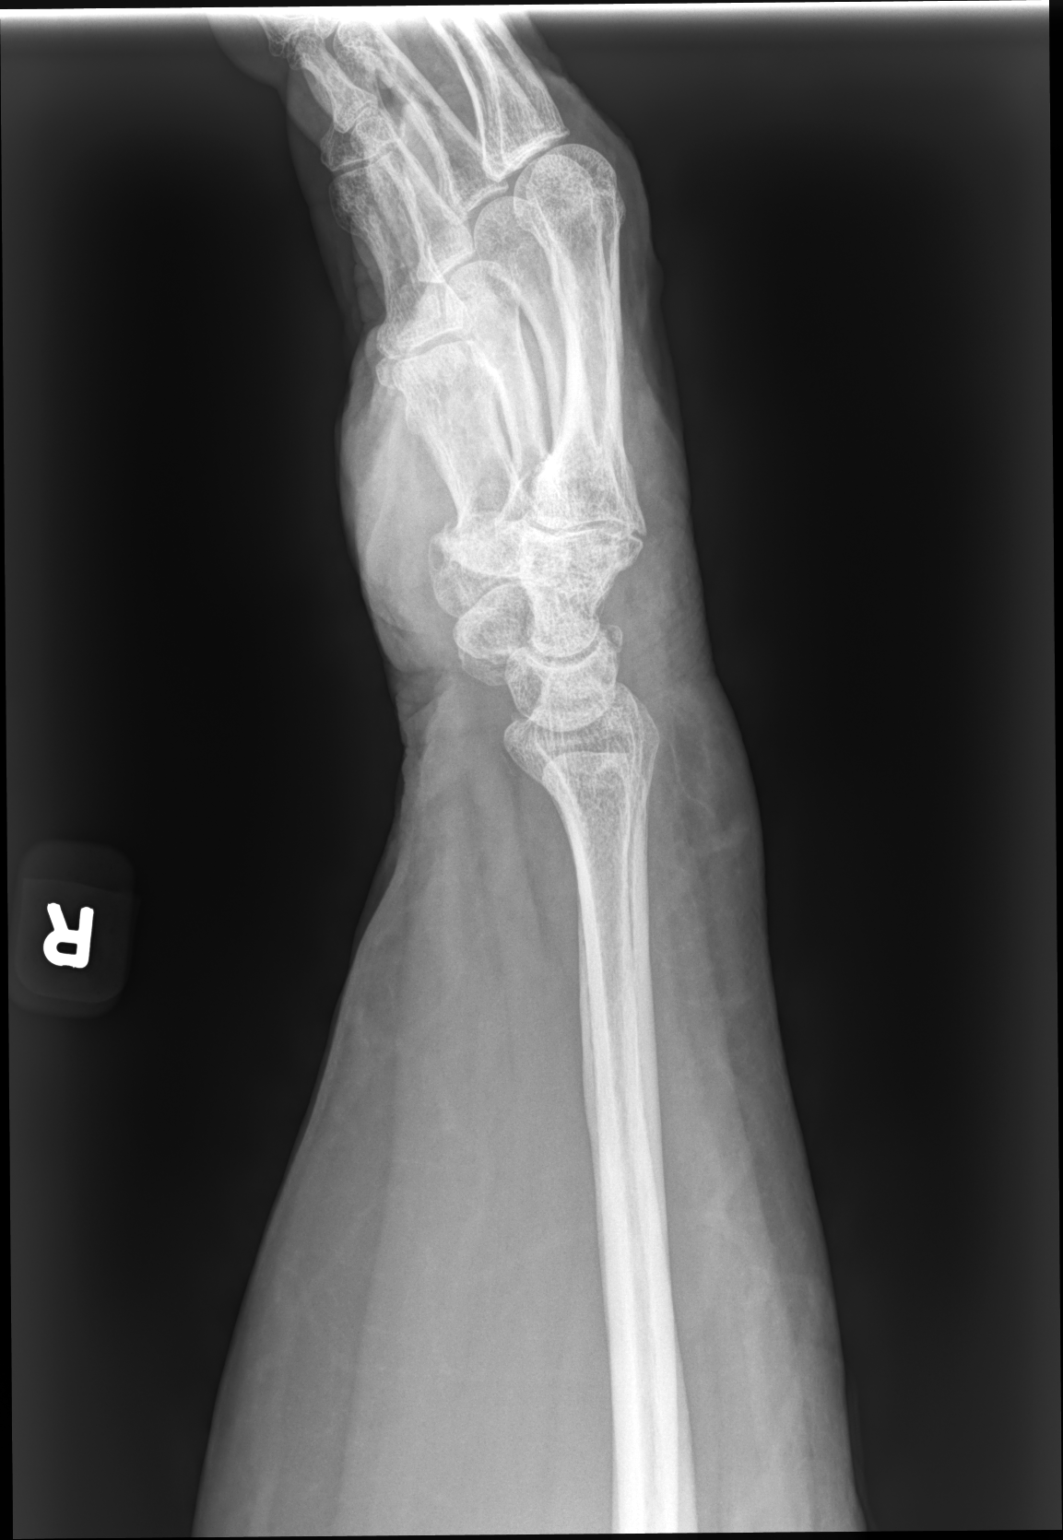

[3 of 3 positions shown; findings below may reference images not displayed]

FINDINGS: There is no evidence of fracture or dislocation. There is no
evidence of arthropathy or other focal bone abnormality. Soft
tissues are unremarkable.
IMPRESSION: Negative.

## 2022-01-09 ENCOUNTER — Ambulatory Visit: Payer: Medicare Other | Admitting: Adult Health

## 2022-01-11 ENCOUNTER — Ambulatory Visit: Payer: Commercial Managed Care - HMO | Admitting: Adult Health

## 2022-01-11 ENCOUNTER — Encounter: Payer: Self-pay | Admitting: Adult Health

## 2022-01-11 VITALS — BP 128/76 | HR 79 | Ht <= 58 in | Wt 228.0 lb

## 2022-01-11 DIAGNOSIS — Z9989 Dependence on other enabling machines and devices: Secondary | ICD-10-CM

## 2022-01-11 DIAGNOSIS — G4733 Obstructive sleep apnea (adult) (pediatric): Secondary | ICD-10-CM | POA: Diagnosis not present

## 2022-01-11 NOTE — Progress Notes (Signed)
PATIENT: [name] DOB: Sep 03, 1946  REASON FOR VISIT: follow up HISTORY FROM: patient  HISTORY OF PRESENT ILLNESS: Today 01/11/22:  [name] is a 76 year old female with a history of obstructive sleep apnea on CPAP.  She returns today for follow-up.  She reports the CPAP is working well for her.  She reports that her husband passed away last year.  She has been processing her grief.    01/06/21: [name] is a 76 year old female with a history of obstructive sleep apnea on CPAP.  Her download indicates that she used her machine nightly for compliance of 100%.  She used her machine greater than 4 hours each night.  On average she uses her machine 7 hours and 38 minutes.  Her residual AHI is 4.2 on 6 to 12 cm of water with EPR of 3.  Her leak in the 95th percentile is 29.1 L/min.  She reports that the CPAP is working well for her.  She does state that she is under a significant mount of stress as her husband has been diagnosed with cancer.  She returns today for an evaluation.   HISTORY (Copied from Dr.Dohmeier's note)  I have the pleasure of meeting [name] today, she has been a patient in our practice in 2016 at the time her main problem was snoring obesity she underwent a sleep study and was diagnosed with mild sleep apnea I "hear from her sleep study dated 08/10/2015.  Patient endorsed the Epworth Sleepiness Scale at 16 out of 24 points with a high degree of sleepiness and also endorses depression scale at 18 points.  BMI 44.7 at the time of this consultation.  The patient had 6 obstructive apneas 13 central and 40 hypopneas her AHI was 9.7.  RDI was 11.8.  REM accentuated the AHI to 13.3 but it was not REM dependent sleep apnea.  She had frequent periodic limb movements with an arousal index of 7.2/h.  I wrote at the time for a CPAP machine and she shows a [employer] a flex which is now 32 years old or older.  The machine provides an average AHI of 4/h now present  expiratory pressure setting of 18 of an inspiratory pressure setting of 21 this is a BiPAP setting 21/18 a rather high pressure she did use a CPAP pressure as well and the auto CPAP mean pressure 90% of the time is 10.8 cmH2O not quite sure if the machine switches between 2 functions were was recently altered.  She has been using the machine for 100% of the last 30 days and her average use at time is 9 hours 45 seconds.  The device apparently was set to 8 CPAP Ultraflex with a minimum pressure of 5 maximum pressure of 12 and to centimeter a flex setting this is what I had ordered originally.   She still feels like she does wake up just is exhausted as she was when to when she went to bed.   Her new fatigue severity score is 48 /63 points her Epworth sleepiness score is 16 / 24 again highly elevated.   Was followed by [facility]-    Dr. Dagmar Hait states that she would be presented to a routine annual evaluation at his office on July 20 she had some external stressors the health of her husband as well as her sister's husband dying of [place] in January.   She feels very tired she has not been seen by neurology in 5 years  and is highly suspicious that her CPAP machine may need adjustment but actually improved with need replacement.     She has a history of a pituitary abnormality of frequent stool, chronic overactive bladder, urge incontinence, and a longstanding history of gastritis, hyperlipidemia, edema lower extremity edema obesity, obstructive sleep apnea GERD, CT abdomen and pelvis was possible lipoma small hiatal hernia, osteopenia since 2007 fibrocystic myofascial syndrome, is followed by Dr. 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  She is now followed by Dr. Michiel Sites, she has a carpal tunnel syndrome and hypothyroidism.  REVIEW OF SYSTEMS: Out of a complete 14 system review of symptoms, the patient complains only of the following symptoms,  and all other reviewed systems are negative.  FSS 48 ESS 8  ALLERGIES: Allergies  Allergen Reactions   Sulfa Antibiotics Swelling    Face, throat, body   Penicillins Itching    Has patient had a PCN reaction causing immediate rash, facial/tongue/throat swelling, SOB or lightheadedness with hypotension: No Has patient had a PCN reaction causing severe rash involving mucus membranes or skin necrosis: No Has patient had a PCN reaction that required hospitalization No Has patient had a PCN reaction occurring within the last 10 years: No If all of the above answers are "NO", then may proceed with Cephalosporin use.    Cyclobenzaprine     Other reaction(s): Other (See Comments), Unknown   Sulfamethoxazole-Trimethoprim     Other reaction(s): Other (See Comments), Unknown    HOME MEDICATIONS: Outpatient Medications Prior to Visit  Medication Sig Dispense Refill   acetaminophen (TYLENOL) 500 MG tablet Take 1 tablet (500 mg total) by mouth every 6 (six) hours as needed. 30 tablet 0   Biotin 1000 MCG tablet 1 tablet     buPROPion (WELLBUTRIN XL) 300 MG 24 hr tablet Take 300 mg by mouth every morning.     Calcium Carbonate-Vitamin D (CALCIUM 600/VITAMIN D PO) Take 1 tablet by mouth daily.     Calcium Carbonate-Vitamin D 600-200 MG-UNIT CAPS Take 1 tablet by mouth daily.     citalopram (CELEXA) 40 MG tablet Take 40 mg by mouth daily. In the afternoon     erythromycin ophthalmic ointment Place a 1/2 inch ribbon of ointment into the both eyelids and around eyes. 3.5 g 0   ferrous sulfate 325 (65 FE) MG tablet Take 325 mg by mouth daily with breakfast.     fluticasone (FLONASE) 50 MCG/ACT nasal spray Place 2 sprays into the nose daily.     HYDROcodone-acetaminophen (NORCO/VICODIN) 5-325 MG tablet Take 1-2 tablets by mouth every 6 (six) hours as needed.     levothyroxine (SYNTHROID) 125 MCG tablet Take 125 mcg by mouth daily before breakfast.      loratadine (CLARITIN) 10 MG tablet Take 10 mg by  mouth daily. In the morning     Misc Natural Products (OSTEO BI-FLEX ADV JOINT SHIELD PO) Take 2 tablets by mouth daily.     Omega-3 Fatty Acids (FISH OIL) 500 MG CAPS Take 500 mg by mouth daily.      omeprazole (PRILOSEC) 40 MG capsule Take 1 capsule (40 mg total) by mouth daily before breakfast. 30 capsule 11   potassium chloride (KLOR-CON) 20 MEQ packet Take by mouth.     potassium chloride SA (K-DUR,KLOR-CON) 20 MEQ tablet Take 20 mEq by mouth 2 (two) times daily.     pravastatin (PRAVACHOL) 20 MG tablet Take 20 mg by mouth  at bedtime.      traMADol (ULTRAM) 50 MG tablet Take 1-2 tablets (50-100 mg total) by mouth every 6 (six) hours as needed for moderate pain. 40 tablet 0   traZODone (DESYREL) 50 MG tablet Take 50 mg by mouth at bedtime.     valsartan-hydrochlorothiazide (DIOVAN-HCT) 160-25 MG tablet Take 1 tablet by mouth daily.      ALPRAZolam (XANAX) 0.5 MG tablet Take 0.5 mg by mouth every 6 (six) hours as needed.     ibuprofen (ADVIL) 200 MG tablet Take 400 mg by mouth every 6 (six) hours as needed for moderate pain.     No facility-administered medications prior to visit.    PAST MEDICAL HISTORY: Past Medical History:  Diagnosis Date   Allergy    SEASONAL-flonase daily as well as Claritin   Anemia    takes Ferrous Sulfate daily   Anxiety    takes Celexa every evening   Arthritis    BACK/KNEE   Cataract    IMMATURE AND ON LEFT EYE SHE THINKS   Chronic back pain    Chronic back pain    L1 FRACTURE   Depression    takes Wellbutrin daily   Diabetes mellitus without complication (HCC)    type II   Dyspnea    with exertion   Fibromyalgia    GERD (gastroesophageal reflux disease)    takes Omeprazole daily and Zantac at bedtime   H/O hiatal hernia    History of colon polyps    History of shingles    Hyperlipidemia    takes Pravastatin daily as well as Questran   Hypertension    takes Diovan daily   Hypothyroidism    Insomnia    takes trazodone nightly   Joint  pain    Muscle spasm    takes Robaxin daily as needed   Nausea    takes Zofran daily as needed   Nocturia    Numbness and tingling    LEGS   Osteoporosis    takes fosamax   Sleep apnea    uses cpap   Thyroid disease    takes Synthroid daily    PAST SURGICAL HISTORY: Past Surgical History:  Procedure Laterality Date   ABDOMINAL HYSTERECTOMY     CARDIAC CATHETERIZATION     CARPAL TUNNEL RELEASE     right and lt   CHOLECYSTECTOMY     COLONOSCOPY     COLONOSCOPY     EYE SURGERY Bilateral    Cataract with lens   FOOT SURGERY Bilateral    FRACTURE SURGERY     rt hand.  no metal   FRACTURE SURGERY     lr wrist. no metal   HAND SURGERY Right 06/1985   HAND SURGERY Left    JOINT REPLACEMENT Right    THR   KNEE ARTHROSCOPY Left    KNEE ARTHROSCOPY Bilateral    KYPHOPLASTY N/A 12/21/2014   Procedure: KYPHOPLASTY LUMBAR ONE;  Surgeon: Newman Pies, MD;  Location: [facility];  Service: Neurosurgery;  Laterality: N/A;  L1 Kyphoplasty   ORIF FINGER FRACTURE  04/11/2012   Procedure: OPEN REDUCTION INTERNAL FIXATION (ORIF) METACARPAL (FINGER) FRACTURE;  Surgeon: Ninetta Lights, MD;  Location: [facility];  Service: Orthopedics;  Laterality: Left;  orif left hand 4th metacarpal    PITUITARY SURGERY  07/2007   tumor resection, BENIGN   RECTOCELE REPAIR     SHOULDER ARTHROSCOPY WITH DISTAL CLAVICLE RESECTION Right 05/20/2015   Procedure: SHOULDER ARTHROSCOPY WITH  DISTAL CLAVICLE RESECTION DEBRIDEMENT PARTIAL ROTATOR CUFF LABRAL TEAR REMOVAL LOOSE BODIES;  Surgeon: Kathryne Hitch, MD;  Location: [facility];  Service: Orthopedics;  Laterality: Right;   TONSILLECTOMY     TOTAL HIP ARTHROPLASTY Right 10/18/2016   Procedure: TOTAL HIP ARTHROPLASTY ANTERIOR APPROACH;  Surgeon: Ninetta Lights, MD;  Location: [facility];  Service: Orthopedics;  Laterality: Right;   TOTAL KNEE ARTHROPLASTY Left 03/16/2020   Procedure: TOTAL KNEE ARTHROPLASTY;  Surgeon: Corky Mull,  MD;  Location: [facility];  Service: Orthopedics;  Laterality: Left;   UPPER GASTROINTESTINAL ENDOSCOPY  02/2013   WRIST SURGERY Left     FAMILY HISTORY: Family History  Problem Relation Age of Onset   Hypertension Mother    Diabetes Mother    Cancer Maternal Grandfather        back   Cancer Paternal Uncle    Lung cancer Paternal Aunt    Lung cancer Sister    Heart attack Brother        x 2    SOCIAL HISTORY: Social History   Socioeconomic History   Marital status: Married    Spouse name: [name]   Number of children: 5   Years of education: Not on file   Highest education level: Not on file  Occupational History   Occupation: retired  Tobacco Use   Smoking status: Former   Smokeless tobacco: Never   Tobacco comments:    as a young erson, off and on , never had a habit  Vaping Use   Vaping Use: Never used  Substance and Sexual Activity   Alcohol use: No   Drug use: No   Sexual activity: Yes    Birth control/protection: Surgical  Other Topics Concern   Not on file  Social History Narrative   Drinks 1 cup of coffee daily. 1 diet pepsi.   Lives with husband   Social Determinants of Health   Financial Resource Strain: Not on file  Food Insecurity: Not on file  Transportation Needs: Not on file  Physical Activity: Not on file  Stress: Not on file  Social Connections: Not on file  Intimate Partner Violence: Not on file      PHYSICAL EXAM  Vitals:   01/11/22 1106  BP: 128/76  Pulse: 79  Weight: 228 lb (103.4 kg)  Height: 4\' 10"  (1.473 m)   Body mass index is 47.65 kg/m.  Generalized: Well developed, in no acute distress  Chest: Lungs clear to auscultation bilaterally  Neurological examination  Mentation: Alert oriented to time, place, history taking. Follows all commands speech and language fluent Cranial nerve II-XII: Extraocular movements were full, visual field were full on confrontational test Head turning and shoulder shrug  were normal and  symmetric. Motor: The motor testing reveals 5 over 5 strength of all 4 extremities. Good symmetric motor tone is noted throughout.  Sensory: Sensory testing is intact to soft touch on all 4 extremities. No evidence of extinction is noted.  Gait and station: Gait is normal.    DIAGNOSTIC DATA (LABS, IMAGING, TESTING) - I reviewed patient records, labs, notes, testing and imaging myself where available.  Lab Results  Component Value Date   WBC 9.0 03/18/2020   HGB 12.6 03/18/2020   HCT 37.1 03/18/2020   MCV 91.2 03/18/2020   PLT 153 03/18/2020      Component Value Date/Time   NA 140 03/19/2020 0505   K 4.3 03/19/2020 0505   CL 102 03/19/2020 0505   CO2  30 03/19/2020 0505   GLUCOSE 108 (H) 03/19/2020 0505   BUN 13 03/19/2020 0505   CREATININE 0.69 03/19/2020 0505   CALCIUM 8.7 (L) 03/19/2020 0505   PROT 6.6 02/17/2020 0842   ALBUMIN 3.8 02/17/2020 0842   AST 29 02/17/2020 0842   ALT 24 02/17/2020 0842   ALKPHOS 58 02/17/2020 0842   BILITOT 1.0 02/17/2020 0842   GFRNONAA >60 03/19/2020 0505   GFRAA >60 03/19/2020 0505      ASSESSMENT AND PLAN 75 y.o. year old female  has a past medical history of Allergy, Anemia, Anxiety, Arthritis, Cataract, Chronic back pain, Chronic back pain, Depression, Diabetes mellitus without complication ([place]), Dyspnea, Fibromyalgia, GERD (gastroesophageal reflux disease), H/O hiatal hernia, History of colon polyps, History of shingles, Hyperlipidemia, Hypertension, Hypothyroidism, Insomnia, Joint pain, Muscle spasm, Nausea, Nocturia, Numbness and tingling, Osteoporosis, Sleep apnea, and Thyroid disease. here with:  OSA on CPAP  - CPAP compliance excellent - Good treatment of AHI  - Encourage patient to use CPAP nightly and > 4 hours each night - F/U in 1 year or sooner if needed   Ward Givens, MSN, NP-C 01/11/2022, 11:19 AM [facility] [address], [place], [place] [address] [phone]

## 2022-01-11 NOTE — Patient Instructions (Signed)
Continue using CPAP nightly and greater than 4 hours each night °If your symptoms worsen or you develop new symptoms please let us know.  ° °

## 2022-12-05 ENCOUNTER — Other Ambulatory Visit: Payer: Self-pay | Admitting: Family Medicine

## 2022-12-05 ENCOUNTER — Ambulatory Visit
Admission: RE | Admit: 2022-12-05 | Discharge: 2022-12-05 | Disposition: A | Discharge status: Home / Self Care | Payer: Medicare Other | Source: Ambulatory Visit | Attending: Family Medicine | Admitting: Family Medicine

## 2022-12-05 DIAGNOSIS — S32040K Wedge compression fracture of fourth lumbar vertebra, subsequent encounter for fracture with nonunion: Secondary | ICD-10-CM | POA: Insufficient documentation

## 2023-01-11 ENCOUNTER — Ambulatory Visit: Payer: 59 | Admitting: Adult Health

## 2023-02-05 NOTE — Progress Notes (Signed)
PATIENT: [name] DOB: Jan 12, 1946  REASON FOR VISIT: follow up HISTORY FROM: patient  Chief Complaint  Patient presents with   Follow-up    Pt in 19 Pt here for CPAP f/u Pt states husband passed in 2022 Pt states has a split in mask      HISTORY OF PRESENT ILLNESS: Today 02/06/23:  [name] is a 77 y.o. female with a history of obstructive sleep apnea on CPAP. Returns today for follow-up.  She reports that the CPAP continues to work well for her.  Continues to notice the benefit.  Download is below.     01/11/22: [name] is a 77 year old female with a history of obstructive sleep apnea on CPAP.  She returns today for follow-up.  She reports the CPAP is working well for her.  She reports that her husband passed away last year.  She has been processing her grief.    01/06/21: [name] is a 77 year old female with a history of obstructive sleep apnea on CPAP.  Her download indicates that she used her machine nightly for compliance of 100%.  She used her machine greater than 4 hours each night.  On average she uses her machine 7 hours and 38 minutes.  Her residual AHI is 4.2 on 6 to 12 cm of water with EPR of 3.  Her leak in the 95th percentile is 29.1 L/min.  She reports that the CPAP is working well for her.  She does state that she is under a significant mount of stress as her husband has been diagnosed with cancer.  She returns today for an evaluation.   HISTORY (Copied from Dr.Dohmeier's note)  I have the pleasure of meeting [name] today, she has been a patient in our practice in 2016 at the time her main problem was snoring obesity she underwent a sleep study and was diagnosed with mild sleep apnea I "hear from her sleep study dated 08/10/2015.  Patient endorsed the Epworth Sleepiness Scale at 16 out of 24 points with a high degree of sleepiness and also endorses depression scale at 18 points.  BMI 44.7 at the time of this consultation.  The patient had 6  obstructive apneas 13 central and 40 hypopneas her AHI was 9.7.  RDI was 11.8.  REM accentuated the AHI to 13.3 but it was not REM dependent sleep apnea.  She had frequent periodic limb movements with an arousal index of 7.2/h.  I wrote at the time for a CPAP machine and she shows a [employer] a flex which is now 74 years old or older.  The machine provides an average AHI of 4/h now present expiratory pressure setting of 18 of an inspiratory pressure setting of 21 this is a BiPAP setting 21/18 a rather high pressure she did use a CPAP pressure as well and the auto CPAP mean pressure 90% of the time is 10.8 cmH2O not quite sure if the machine switches between 2 functions were was recently altered.  She has been using the machine for 100% of the last 30 days and her average use at time is 9 hours 45 seconds.  The device apparently was set to 8 CPAP Ultraflex with a minimum pressure of 5 maximum pressure of 12 and to centimeter a flex setting this is what I had ordered originally.   She still feels like she does wake up just is exhausted as she was when to when she went to bed.  Her new fatigue severity score is 48 /63 points her Epworth sleepiness score is 16 / 24 again highly elevated.   Was followed by [facility]-    Dr. Felipa Eth states that she would be presented to a routine annual evaluation at his office on July 20 she had some external stressors the health of her husband as well as her sister's husband dying of COVID in January.   She feels very tired she has not been seen by neurology in 5 years and is highly suspicious that her CPAP machine may need adjustment but actually improved with need replacement.     She has a history of a pituitary abnormality of frequent stool, chronic overactive bladder, urge incontinence, and a longstanding history of gastritis, hyperlipidemia, edema lower extremity edema obesity, obstructive sleep apnea GERD, CT abdomen and pelvis was possible lipoma small hiatal  hernia, osteopenia since 2007 fibrocystic myofascial syndrome, is followed by Dr. 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  She is now followed by Dr. Cleon Gustin, she has a carpal tunnel syndrome and hypothyroidism.  REVIEW OF SYSTEMS: Out of a complete 14 system review of symptoms, the patient complains only of the following symptoms, and all other reviewed systems are negative.  FSS 32 ESS 5  ALLERGIES: Allergies  Allergen Reactions   Sulfa Antibiotics Swelling    Face, throat, body   Penicillins Itching    Has patient had a PCN reaction causing immediate rash, facial/tongue/throat swelling, SOB or lightheadedness with hypotension: No Has patient had a PCN reaction causing severe rash involving mucus membranes or skin necrosis: No Has patient had a PCN reaction that required hospitalization No Has patient had a PCN reaction occurring within the last 10 years: No If all of the above answers are "NO", then may proceed with Cephalosporin use.    Cyclobenzaprine     Other reaction(s): Other (See Comments), Unknown   Sulfamethoxazole-Trimethoprim     Other reaction(s): Other (See Comments), Unknown    HOME MEDICATIONS: Outpatient Medications Prior to Visit  Medication Sig Dispense Refill   acetaminophen (TYLENOL) 500 MG tablet Take 1 tablet (500 mg total) by mouth every 6 (six) hours as needed. (Patient taking differently: Take 500 mg by mouth as needed.) 30 tablet 0   ALPRAZolam (XANAX) 0.5 MG tablet Take 0.5 mg by mouth as needed.     buPROPion (WELLBUTRIN XL) 300 MG 24 hr tablet Take 300 mg by mouth every morning.     Calcium Carbonate-Vitamin D (CALCIUM 600/VITAMIN D PO) Take 1 tablet by mouth daily.     ferrous sulfate 325 (65 FE) MG tablet Take 325 mg by mouth as needed.     fluticasone (FLONASE) 50 MCG/ACT nasal spray Place 2 sprays into the nose daily.     HYDROcodone-acetaminophen (NORCO/VICODIN) 5-325 MG  tablet Take 1-2 tablets by mouth every 6 (six) hours as needed.     ibuprofen (ADVIL) 200 MG tablet Take 400 mg by mouth as needed for moderate pain.     levothyroxine (SYNTHROID) 125 MCG tablet Take 125 mcg by mouth daily before breakfast.      loratadine (CLARITIN) 10 MG tablet Take 10 mg by mouth daily. In the morning     Misc Natural Products (OSTEO BI-FLEX ADV JOINT SHIELD PO) Take 2 tablets by mouth daily.     omeprazole (PRILOSEC) 40 MG capsule Take 1 capsule (40 mg total) by mouth daily before breakfast. (Patient taking differently: Take  40 mg by mouth as needed.) 30 capsule 11   potassium chloride SA (K-DUR,KLOR-CON) 20 MEQ tablet Take 40 mEq by mouth 2 (two) times daily.     pravastatin (PRAVACHOL) 20 MG tablet Take 20 mg by mouth at bedtime.      traMADol (ULTRAM) 50 MG tablet Take 1-2 tablets (50-100 mg total) by mouth every 6 (six) hours as needed for moderate pain. 40 tablet 0   traZODone (DESYREL) 50 MG tablet Take 50 mg by mouth at bedtime.     valsartan-hydrochlorothiazide (DIOVAN-HCT) 160-25 MG tablet Take 1 tablet by mouth daily.      Biotin 1000 MCG tablet 1 tablet     Calcium Carbonate-Vitamin D 600-200 MG-UNIT CAPS Take 1 tablet by mouth daily.     citalopram (CELEXA) 40 MG tablet Take 40 mg by mouth daily. In the afternoon     erythromycin ophthalmic ointment Place a 1/2 inch ribbon of ointment into the both eyelids and around eyes. 3.5 g 0   Omega-3 Fatty Acids (FISH OIL) 500 MG CAPS Take 500 mg by mouth daily.      potassium chloride (KLOR-CON) 20 MEQ packet Take by mouth.     No facility-administered medications prior to visit.    PAST MEDICAL HISTORY: Past Medical History:  Diagnosis Date   Allergy    SEASONAL-flonase daily as well as Claritin   Anemia    takes Ferrous Sulfate daily   Anxiety    takes Celexa every evening   Arthritis    BACK/KNEE   Cataract    IMMATURE AND ON LEFT EYE SHE THINKS   Chronic back pain    Chronic back pain    L1 FRACTURE    Depression    takes Wellbutrin daily   Diabetes mellitus without complication (HCC)    type II   Dyspnea    with exertion   Fibromyalgia    GERD (gastroesophageal reflux disease)    takes Omeprazole daily and Zantac at bedtime   H/O hiatal hernia    History of colon polyps    History of shingles    Hyperlipidemia    takes Pravastatin daily as well as Questran   Hypertension    takes Diovan daily   Hypothyroidism    Insomnia    takes trazodone nightly   Joint pain    Muscle spasm    takes Robaxin daily as needed   Nausea    takes Zofran daily as needed   Nocturia    Numbness and tingling    LEGS   Osteoporosis    takes fosamax   Sleep apnea    uses cpap   Thyroid disease    takes Synthroid daily    PAST SURGICAL HISTORY: Past Surgical History:  Procedure Laterality Date   ABDOMINAL HYSTERECTOMY     CARDIAC CATHETERIZATION     CARPAL TUNNEL RELEASE     right and lt   CHOLECYSTECTOMY     COLONOSCOPY     COLONOSCOPY     EYE SURGERY Bilateral    Cataract with lens   FOOT SURGERY Bilateral    FRACTURE SURGERY     rt hand.  no metal   FRACTURE SURGERY     lr wrist. no metal   HAND SURGERY Right 06/1985   HAND SURGERY Left    JOINT REPLACEMENT Right    THR   KNEE ARTHROSCOPY Left    KNEE ARTHROSCOPY Bilateral    KYPHOPLASTY N/A 12/21/2014   Procedure: KYPHOPLASTY LUMBAR  ONE;  Surgeon: Tressie Stalker, MD;  Location: [facility];  Service: Neurosurgery;  Laterality: N/A;  L1 Kyphoplasty   ORIF FINGER FRACTURE  04/11/2012   Procedure: OPEN REDUCTION INTERNAL FIXATION (ORIF) METACARPAL (FINGER) FRACTURE;  Surgeon: Loreta Ave, MD;  Location: [facility];  Service: Orthopedics;  Laterality: Left;  orif left hand 4th metacarpal    PITUITARY SURGERY  07/2007   tumor resection, BENIGN   RECTOCELE REPAIR     SHOULDER ARTHROSCOPY WITH DISTAL CLAVICLE RESECTION Right 05/20/2015   Procedure: SHOULDER ARTHROSCOPY WITH DISTAL CLAVICLE RESECTION DEBRIDEMENT  PARTIAL ROTATOR CUFF LABRAL TEAR REMOVAL LOOSE BODIES;  Surgeon: Mckinley Jewel, MD;  Location: [facility];  Service: Orthopedics;  Laterality: Right;   TONSILLECTOMY     TOTAL HIP ARTHROPLASTY Right 10/18/2016   Procedure: TOTAL HIP ARTHROPLASTY ANTERIOR APPROACH;  Surgeon: Loreta Ave, MD;  Location: [facility];  Service: Orthopedics;  Laterality: Right;   TOTAL KNEE ARTHROPLASTY Left 03/16/2020   Procedure: TOTAL KNEE ARTHROPLASTY;  Surgeon: Christena Flake, MD;  Location: [facility];  Service: Orthopedics;  Laterality: Left;   UPPER GASTROINTESTINAL ENDOSCOPY  02/2013   WRIST SURGERY Left     FAMILY HISTORY: Family History  Problem Relation Age of Onset   Hypertension Mother    Diabetes Mother    Lung cancer Sister    Heart attack Brother        x 2   COPD Brother    Lung cancer Paternal Aunt    Cancer Paternal Uncle    Cancer Maternal Grandfather        back   Sleep apnea Neg Hx     SOCIAL HISTORY: Social History   Socioeconomic History   Marital status: Married    Spouse name: Kathlene November   Number of children: 5   Years of education: Not on file   Highest education level: Not on file  Occupational History   Occupation: retired  Tobacco Use   Smoking status: Former   Smokeless tobacco: Never   Tobacco comments:    as a young erson, off and on , never had a habit  Vaping Use   Vaping Use: Never used  Substance and Sexual Activity   Alcohol use: No   Drug use: No   Sexual activity: Yes    Birth control/protection: Surgical  Other Topics Concern   Not on file  Social History Narrative   Drinks 1 cup of coffee daily. 1 diet pepsi.   Lives with husband   Social Determinants of Health   Financial Resource Strain: Not on file  Food Insecurity: Not on file  Transportation Needs: Not on file  Physical Activity: Not on file  Stress: Not on file  Social Connections: Not on file  Intimate Partner Violence: Not on file      PHYSICAL EXAM  Vitals:    02/06/23 1047  BP: (!) 102/59  Pulse: 76  Weight: 211 lb 3.2 oz (95.8 kg)  Height: 4\' 11"  (1.499 m)    Body mass index is 42.66 kg/m.  Generalized: Well developed, in no acute distress  Chest: Lungs clear to auscultation bilaterally  Neurological examination  Mentation: Alert oriented to time, place, history taking. Follows all commands speech and language fluent Cranial nerve II-XII: Extraocular movements were full, visual field were full on confrontational test Head turning and shoulder shrug  were normal and symmetric.    DIAGNOSTIC DATA (LABS, IMAGING, TESTING) - I reviewed patient records, labs, notes, testing  and imaging myself where available.  Lab Results  Component Value Date   WBC 9.0 03/18/2020   HGB 12.6 03/18/2020   HCT 37.1 03/18/2020   MCV 91.2 03/18/2020   PLT 153 03/18/2020      Component Value Date/Time   NA 140 03/19/2020 0505   K 4.3 03/19/2020 0505   CL 102 03/19/2020 0505   CO2 30 03/19/2020 0505   GLUCOSE 108 (H) 03/19/2020 0505   BUN 13 03/19/2020 0505   CREATININE 0.69 03/19/2020 0505   CALCIUM 8.7 (L) 03/19/2020 0505   PROT 6.6 02/17/2020 0842   ALBUMIN 3.8 02/17/2020 0842   AST 29 02/17/2020 0842   ALT 24 02/17/2020 0842   ALKPHOS 58 02/17/2020 0842   BILITOT 1.0 02/17/2020 0842   GFRNONAA >60 03/19/2020 0505   GFRAA >60 03/19/2020 0505      ASSESSMENT AND PLAN 77 y.o. year old female  has a past medical history of Allergy, Anemia, Anxiety, Arthritis, Cataract, Chronic back pain, Chronic back pain, Depression, Diabetes mellitus without complication (HCC), Dyspnea, Fibromyalgia, GERD (gastroesophageal reflux disease), H/O hiatal hernia, History of colon polyps, History of shingles, Hyperlipidemia, Hypertension, Hypothyroidism, Insomnia, Joint pain, Muscle spasm, Nausea, Nocturia, Numbness and tingling, Osteoporosis, Sleep apnea, and Thyroid disease. here with:  OSA on CPAP  - CPAP compliance excellent - Good treatment of AHI  -  Encourage patient to use CPAP nightly and > 4 hours each night -Order sent to establish with new DME company - F/U in 1 year or sooner if needed   Butch Penny, MSN, NP-C 02/06/2023, 10:49 AM [facility] [address] [place], [place] [address] [phone]

## 2023-02-06 ENCOUNTER — Encounter: Payer: Self-pay | Admitting: Adult Health

## 2023-02-06 ENCOUNTER — Ambulatory Visit (INDEPENDENT_AMBULATORY_CARE_PROVIDER_SITE_OTHER): Payer: 59 | Admitting: Adult Health

## 2023-02-06 VITALS — BP 102/59 | HR 76 | Ht 59.0 in | Wt 211.2 lb

## 2023-02-06 DIAGNOSIS — G4733 Obstructive sleep apnea (adult) (pediatric): Secondary | ICD-10-CM

## 2023-02-06 NOTE — Patient Instructions (Signed)
Continue using CPAP nightly and greater than 4 hours each night °If your symptoms worsen or you develop new symptoms please let us know.  ° °

## 2023-02-07 ENCOUNTER — Telehealth: Payer: Self-pay | Admitting: *Deleted

## 2023-02-07 NOTE — Telephone Encounter (Signed)
Called pt daughter (checked DPR) Pt VM full couldn't leave message . Left VM with daughter to call back due to patients  current DME ([facility])she wants to [facility] to we no longer use [facility] here at [facility] for CPAP machine and supplies

## 2023-02-12 NOTE — Telephone Encounter (Signed)
Called patient VM full called daughter (checked DPR) LVM to call back to discuss change of DME Faxed [facility] this afternoon sent new orders for patient

## 2023-02-19 NOTE — Telephone Encounter (Signed)
Pt is calling stated she is returning a call from nurse and following up on her CPAP mask.

## 2023-02-21 NOTE — Telephone Encounter (Signed)
Called pt, mailbox full could LM.

## 2023-02-22 NOTE — Telephone Encounter (Signed)
I called home/mobie of pt, no answer. Then call Tan at [facility].  They did have order and just need to speak to pt or daughter.  I call and LMVM for daughter regarding Robynn Pane , need to call [facility] about cpap/ TOC and supplies.  She is to call back if questions.

## 2023-02-22 NOTE — Telephone Encounter (Signed)
I called and still not able to [facility].

## 2023-03-23 ENCOUNTER — Other Ambulatory Visit: Payer: Self-pay

## 2023-03-23 ENCOUNTER — Emergency Department (HOSPITAL_COMMUNITY): Payer: 59

## 2023-03-23 ENCOUNTER — Encounter (HOSPITAL_COMMUNITY): Payer: Self-pay | Admitting: Emergency Medicine

## 2023-03-23 ENCOUNTER — Emergency Department (HOSPITAL_COMMUNITY)
Admission: EM | Admit: 2023-03-23 | Discharge: 2023-03-23 | Disposition: A | Discharge status: Home / Self Care | Payer: 59 | Attending: Emergency Medicine | Admitting: Emergency Medicine

## 2023-03-23 DIAGNOSIS — S12100A Unspecified displaced fracture of second cervical vertebra, initial encounter for closed fracture: Secondary | ICD-10-CM | POA: Insufficient documentation

## 2023-03-23 DIAGNOSIS — M25531 Pain in right wrist: Secondary | ICD-10-CM | POA: Diagnosis present

## 2023-03-23 DIAGNOSIS — R519 Headache, unspecified: Secondary | ICD-10-CM | POA: Insufficient documentation

## 2023-03-23 DIAGNOSIS — S01511A Laceration without foreign body of lip, initial encounter: Secondary | ICD-10-CM | POA: Diagnosis not present

## 2023-03-23 DIAGNOSIS — I10 Essential (primary) hypertension: Secondary | ICD-10-CM | POA: Diagnosis not present

## 2023-03-23 DIAGNOSIS — E119 Type 2 diabetes mellitus without complications: Secondary | ICD-10-CM | POA: Diagnosis not present

## 2023-03-23 DIAGNOSIS — Z79899 Other long term (current) drug therapy: Secondary | ICD-10-CM | POA: Diagnosis not present

## 2023-03-23 DIAGNOSIS — Z23 Encounter for immunization: Secondary | ICD-10-CM | POA: Insufficient documentation

## 2023-03-23 DIAGNOSIS — Y9289 Other specified places as the place of occurrence of the external cause: Secondary | ICD-10-CM | POA: Diagnosis not present

## 2023-03-23 DIAGNOSIS — W01198A Fall on same level from slipping, tripping and stumbling with subsequent striking against other object, initial encounter: Secondary | ICD-10-CM | POA: Insufficient documentation

## 2023-03-23 DIAGNOSIS — S52531A Colles' fracture of right radius, initial encounter for closed fracture: Secondary | ICD-10-CM | POA: Insufficient documentation

## 2023-03-23 DIAGNOSIS — W19XXXA Unspecified fall, initial encounter: Secondary | ICD-10-CM

## 2023-03-23 MED ORDER — LIDOCAINE HCL 2 % IJ SOLN
10.0000 mL | Freq: Once | INTRAMUSCULAR | Status: AC
  Administered 2023-03-23: 200 mg
  Filled 2023-03-23: qty 20

## 2023-03-23 MED ORDER — HYDROMORPHONE HCL 1 MG/ML IJ SOLN
1.0000 mg | Freq: Once | INTRAMUSCULAR | Status: AC
  Administered 2023-03-23: 1 mg via INTRAVENOUS
  Filled 2023-03-23: qty 1

## 2023-03-23 MED ORDER — FENTANYL CITRATE PF 50 MCG/ML IJ SOSY
50.0000 ug | PREFILLED_SYRINGE | Freq: Once | INTRAMUSCULAR | Status: AC
  Administered 2023-03-23: 50 ug via INTRAVENOUS
  Filled 2023-03-23: qty 1

## 2023-03-23 MED ORDER — TETANUS-DIPHTH-ACELL PERTUSSIS 5-2.5-18.5 LF-MCG/0.5 IM SUSY
0.5000 mL | PREFILLED_SYRINGE | Freq: Once | INTRAMUSCULAR | Status: AC
  Administered 2023-03-23: 0.5 mL via INTRAMUSCULAR
  Filled 2023-03-23: qty 0.5

## 2023-03-23 MED ORDER — OXYCODONE-ACETAMINOPHEN 5-325 MG PO TABS: 1.0000 | ORAL_TABLET | Freq: Four times a day (QID) | ORAL | 0 refills | Status: AC | PRN

## 2023-03-23 MED ORDER — OXYCODONE-ACETAMINOPHEN 5-325 MG PO TABS
1.0000 | ORAL_TABLET | Freq: Once | ORAL | Status: AC
  Administered 2023-03-23: 1 via ORAL
  Filled 2023-03-23: qty 1

## 2023-03-23 NOTE — ED Notes (Signed)
Patient transported to X-ray 

## 2023-03-23 NOTE — ED Notes (Signed)
Patient transported to CT 

## 2023-03-23 NOTE — Discharge Instructions (Signed)
You were seen in the emergency department today after a fall.  1) you have a small fracture in the second cervical vertebrae in your neck.  You are to wear the collar at all times, except for bathing.  I have attached some instructions for how to take care of it.  You will follow-up with the neurosurgeon in the next few weeks, please call their office on Monday to schedule an appointment.  2) we reduced the fracture of your right wrist.  I have sent a prescription for pain medicine to your pharmacy.  The hand surgeon would like to see you for follow-up, call their office next week.  3) We were able to close your laceration with skin glue. The adhesive should peel off in about 5 to 10 days.  If it comes off sooner that is okay.  If it lasts longer than that, you can use some Vaseline to help it come off on its own.  With the glue, you may shower, but do not soak or scrub the area for 7 to 10 days.  Then make sure that you pat the area dry.   If you want to wear a bandage over the area that is fine as well, but make sure it is clean/dry (has no ointment on it).  Watch out for signs of infection like we discussed, including: increased redness, tenderness, or drainage of pus from the site. If this happens and you were not prescribed antibiotics, please seek medical attention.   Continue to monitor how you're doing and return to the ER for new or worsening symptoms.

## 2023-03-23 NOTE — ED Triage Notes (Signed)
Pt BIB [facility] from [facility] as she was placing flowers on graves and she tripped and fell face first.  She tried to break her fall by extending her right arm.  Pt reports severe pain in right arm.  Pt given 27mcg fentanyl en route.  20g left forearm.  VS BP 132/90, HR 80, SpO2 97%

## 2023-03-23 NOTE — ED Provider Notes (Signed)
I provided a substantive portion of the care of this patient.  I personally made/approved the management plan for this patient and take responsibility for the patient management.      77 year old female with mechanical fall just prior to arrival.  Has a fracture to her right wrist.  Will set the fracture and give patient referral to hand   Lacretia Leigh, MD 03/23/23 1853

## 2023-03-23 NOTE — ED Notes (Addendum)
Pt has small laceration to right nasolabial fold above upper lip.  Right arm currently in splint placed by [facility].  Noticeable bruising to right upper eye brown, right side of nose and right upper lip.

## 2023-03-23 NOTE — ED Provider Notes (Signed)
[facility] Provider Note   CSN: [id number] Arrival date & time: 03/23/23  1532     History  Chief Complaint  Patient presents with   Fall    R Arm; R Shoulder    [name] is a 77 y.o. female with history of chronic back pain, thyroid disease, depression, anxiety, hyperlipidemia, GERD, hypertension, fibromyalgia, osteoporosis, type 2 diabetes, sleep apnea on CPAP who presents the emergency department after a fall.  Patient states that she was at the [facility] and she was laying flowers on a grave, when she lost her balance and fell.  She tried to break her fall using her right arm, but did strike her face on the ground.  No loss of consciousness, she is not on blood thinners.  She is complaining of pain in her face and her right wrist.  Patient states that while EMS was moving her she did have some pain under her right breast, but says this is resolved now.   Fall       Home Medications Prior to Admission medications   Medication Sig Start Date End Date Taking? Authorizing Provider  oxyCODONE-acetaminophen (PERCOCET/ROXICET) 5-325 MG tablet Take 1 tablet by mouth every 6 (six) hours as needed for up to 3 days for severe pain. 03/23/23 03/26/23 Yes Andrianna Manalang T, PA-C  acetaminophen (TYLENOL) 500 MG tablet Take 1 tablet (500 mg total) by mouth every 6 (six) hours as needed. Patient taking differently: Take 500 mg by mouth as needed. 08/12/20   Avegno, Darrelyn Hillock, FNP  ALPRAZolam Duanne Moron) 0.5 MG tablet Take 0.5 mg by mouth as needed. 01/25/21   [provider]  Biotin 1000 MCG tablet 1 tablet    [provider]  buPROPion (WELLBUTRIN XL) 300 MG 24 hr tablet Take 300 mg by mouth every morning. 05/12/21   [provider]  Calcium Carbonate-Vitamin D (CALCIUM 600/VITAMIN D PO) Take 1 tablet by mouth daily.    [provider]  Calcium Carbonate-Vitamin D 600-200 MG-UNIT CAPS Take 1 tablet by mouth daily.     [provider]  citalopram (CELEXA) 40 MG tablet Take 40 mg by mouth daily. In the afternoon    [provider]  erythromycin ophthalmic ointment Place a 1/2 inch ribbon of ointment into the both eyelids and around eyes. 09/12/20   Wurst, [place], PA-C  ferrous sulfate 325 (65 FE) MG tablet Take 325 mg by mouth as needed.    [provider]  fluticasone (FLONASE) 50 MCG/ACT nasal spray Place 2 sprays into the nose daily.    [provider]  HYDROcodone-acetaminophen (NORCO/VICODIN) 5-325 MG tablet Take 1-2 tablets by mouth every 6 (six) hours as needed. 07/01/20   [provider]  ibuprofen (ADVIL) 200 MG tablet Take 400 mg by mouth as needed for moderate pain.    [provider]  levothyroxine (SYNTHROID) 125 MCG tablet Take 125 mcg by mouth daily before breakfast.     [provider]  loratadine (CLARITIN) 10 MG tablet Take 10 mg by mouth daily. In the morning    [provider]  Misc Natural Products (OSTEO BI-FLEX ADV JOINT SHIELD PO) Take 2 tablets by mouth daily.    [provider]  Omega-3 Fatty Acids (FISH OIL) 500 MG CAPS Take 500 mg by mouth daily.     [provider]  omeprazole (PRILOSEC) 40 MG capsule Take 1 capsule (40 mg total) by mouth daily before breakfast. Patient taking  differently: Take 40 mg by mouth as needed. 01/07/13   Milus Banister, MD  potassium chloride (KLOR-CON) 20 MEQ packet Take by mouth.    [provider]  potassium chloride SA (K-DUR,KLOR-CON) 20 MEQ tablet Take 40 mEq by mouth 2 (two) times daily.    [provider]  pravastatin (PRAVACHOL) 20 MG tablet Take 20 mg by mouth at bedtime.     [provider]  traMADol (ULTRAM) 50 MG tablet Take 1-2 tablets (50-100 mg total) by mouth every 6 (six) hours as needed for moderate pain. 03/18/20   Lattie Corns, PA-C  traZODone (DESYREL) 50 MG tablet Take 50 mg by mouth at bedtime.    [provider]  valsartan-hydrochlorothiazide (DIOVAN-HCT) 160-25 MG tablet Take 1 tablet by mouth daily.     [provider]      Allergies    Sulfa antibiotics, Penicillins, Cyclobenzaprine, and Sulfamethoxazole-trimethoprim    Review of Systems   Review of Systems  Musculoskeletal:  Positive for arthralgias.  Skin:  Positive for wound.  All other systems reviewed and are negative.   Physical Exam Updated Vital Signs BP (!) 169/77   Pulse 85   Temp 97.8 F (36.6 C) (Oral)   Resp 15   Ht 4\' 11"  (1.499 m)   Wt 93 kg   SpO2 95%   BMI 41.40 kg/m  Physical Exam Vitals and nursing note reviewed.  Constitutional:      Appearance: Normal appearance.  HENT:     Head: Normocephalic.     Comments: Superficial abrasion noted to the right upper lip. Bruising to the forehead and nose. Very small laceration to the left lateral upper exterior lip, just barely crossing the vermilion border Eyes:     General: Lids are normal.     Conjunctiva/sclera: Conjunctivae normal.  Neck:     Comments: No cervical midline spinal tenderness, step-offs or crepitus Cardiovascular:     Rate and Rhythm: Normal rate and regular rhythm.  Pulmonary:     Effort: Pulmonary effort is normal. No respiratory distress.     Breath sounds: Normal breath sounds.  Chest:     Comments: Chest wall is stable Abdominal:     General: There is no distension.     Palpations: Abdomen is soft.     Tenderness: There is no abdominal tenderness.     Comments: Abdomen soft, nontender  Musculoskeletal:     Comments: Pelvis stable.    Right wrist in splint, can range the digits, with normal sensation. After removing splint, obvious deformity noted to the right wrist. No breaks in the skin. Normal capillary refill.  Pain with moving the right arm, especially at the level of the forearm.   Skin:    General: Skin is warm and dry.     Capillary Refill: Capillary refill takes less than 2 seconds.  Neurological:      General: No focal deficit present.     Mental Status: She is alert.     ED Results / Procedures / Treatments   Labs (all labs ordered are listed, but only abnormal results are displayed) Labs Reviewed - No data to display  EKG None  Radiology DG Wrist Complete Right  Result Date: 03/23/2023 CLINICAL DATA:  Postreduction views EXAM: RIGHT WRIST - COMPLETE 3+ VIEW COMPARISON:  Study done earlier today FINDINGS: There is marked improvement in alignment of fracture fragments in the distal radius. There is interval correction of volar angulation. There is mild residual dorsal  displacement of distal major fracture fragment. IMPRESSION: Significant interval improvement in alignment of fracture fragments in distal radius after reduction. Electronically Signed   By: Elmer Picker M.D.   On: 03/23/2023 20:33   CT Cervical Spine Wo Contrast  Result Date: 03/23/2023 CLINICAL DATA:  [place], nasal laceration EXAM: CT CERVICAL SPINE WITHOUT CONTRAST TECHNIQUE: Multidetector CT imaging of the cervical spine was performed without intravenous contrast. Multiplanar CT image reconstructions were also generated. RADIATION DOSE REDUCTION: This exam was performed according to the departmental dose-optimization program which includes automated exposure control, adjustment of the mA and/or kV according to patient size and/or use of iterative reconstruction technique. COMPARISON:  None Available. FINDINGS: Alignment: Alignment is grossly anatomic. Skull base and vertebrae: There is a small acute oblique fracture through the anterior inferior margin of the C2 vertebral body, with less than 2 mm of separation of the fracture fragment. There are no other acute displaced cervical spine fractures. Soft tissues and spinal canal: No prevertebral fluid or swelling. No visible canal hematoma. Disc levels: There is multilevel cervical spondylosis. At C3-4 and C4-5 there is circumferential disc osteophyte complex, eccentric to  the left, resulting in mild central canal stenosis and left greater than right neural foraminal narrowing. Upper chest: Airway is patent. Visualized portions of the lung apices are clear. Other: Reconstructed images demonstrate no additional findings. IMPRESSION: 1. Small oblique fracture through the anterior inferior margin of the C2 vertebral body, with minimal displacement of the fracture fragment. 2. No other acute cervical spine fractures. 3. Prominent C3-4 and C4-5 spondylosis, with left greater than right neural foraminal encroachment and mild central canal stenosis. Critical Value/emergent results were called by telephone at the time of interpretation on 03/23/2023 at 5:29pm to provider [facility] Duana Benedict , who verbally acknowledged these results. Electronically Signed   By: Randa Ngo M.D.   On: 03/23/2023 17:42   CT Maxillofacial Wo Contrast  Result Date: 03/23/2023 CLINICAL DATA:  Facial trauma, blunt.  Fall. EXAM: CT MAXILLOFACIAL WITHOUT CONTRAST TECHNIQUE: Multidetector CT imaging of the maxillofacial structures was performed. Multiplanar CT image reconstructions were also generated. RADIATION DOSE REDUCTION: This exam was performed according to the departmental dose-optimization program which includes automated exposure control, adjustment of the mA and/or kV according to patient size and/or use of iterative reconstruction technique. COMPARISON:  None Available. FINDINGS: Osseous: No fracture or mandibular dislocation. No destructive process. Orbits: Negative. No traumatic or inflammatory finding. Sinuses: Clear Soft tissues: Negative Limited intracranial: See head CT report IMPRESSION: No facial or orbital fracture Electronically Signed   By: Rolm Baptise M.D.   On: 03/23/2023 17:28   CT Head Wo Contrast  Result Date: 03/23/2023 CLINICAL DATA:  Fall EXAM: CT HEAD WITHOUT CONTRAST TECHNIQUE: Contiguous axial images were obtained from the base of the skull through the vertex without intravenous  contrast. RADIATION DOSE REDUCTION: This exam was performed according to the departmental dose-optimization program which includes automated exposure control, adjustment of the mA and/or kV according to patient size and/or use of iterative reconstruction technique. COMPARISON:  MRI 05/31/2010 FINDINGS: Brain: Mild age related atrophy. No acute intracranial abnormality. Specifically, no hemorrhage, hydrocephalus, mass lesion, acute infarction, or significant intracranial injury. Vascular: No hyperdense vessel or unexpected calcification. Skull: No acute calvarial abnormality. Sinuses/Orbits: No acute findings Other: None IMPRESSION: No acute intracranial abnormality. Electronically Signed   By: Rolm Baptise M.D.   On: 03/23/2023 17:26   DG Elbow Complete Right  Result Date: 03/23/2023 CLINICAL DATA:  [place], pain EXAM: RIGHT ELBOW -  COMPLETE 3+ VIEW COMPARISON:  None Available. FINDINGS: Frontal, bilateral oblique, and lateral views of the right elbow are obtained on 4 images. There are no acute displaced fractures. Mild osteoarthritis. Enthesopathic changes along the lateral humeral epicondyle. No joint effusion. Soft tissues are unremarkable. IMPRESSION: 1. Osteoarthritis.  No acute fracture. Electronically Signed   By: Randa Ngo M.D.   On: 03/23/2023 17:05   DG Wrist Complete Right  Result Date: 03/23/2023 CLINICAL DATA:  [place], pain EXAM: RIGHT WRIST - COMPLETE 3+ VIEW COMPARISON:  02/17/2021 FINDINGS: Frontal, oblique, and lateral views of the right wrist are obtained. There is a comminuted intra-articular distal right radial fracture, with severe dorsal impaction and angulation at the fracture site. The radiocarpal joint appears intact. No other acute bony abnormalities. Mild diffuse osteoarthritis throughout the carpus. Diffuse soft tissue swelling. IMPRESSION: 1. Comminuted intra-articular distal right radial fracture, with severe impaction and dorsal angulation at the fracture site. 2. Diffuse soft  tissue swelling. 3. Osteoarthritis. Electronically Signed   By: Randa Ngo M.D.   On: 03/23/2023 17:04   DG Shoulder Right  Result Date: 03/23/2023 CLINICAL DATA:  Pain after fall. EXAM: RIGHT SHOULDER - 3 VIEW COMPARISON:  None Available. FINDINGS: No fracture or dislocation. Preserved joint spaces and bone mineralization. Overlapping cardiac leads. IMPRESSION: No acute osseous abnormality Electronically Signed   By: Jill Side M.D.   On: 03/23/2023 17:03   DG Chest 2 View  Result Date: 03/23/2023 CLINICAL DATA:  Trauma, fall, chest pain EXAM: CHEST - 2 VIEW COMPARISON:  09/05/2014 FINDINGS: Transverse diameter of heart is increased. There are no signs of pulmonary edema or focal pulmonary consolidation. There is no pleural effusion or pneumothorax. There is widening of space between lateral end of right clavicle and acromion. There is previous vertebroplasty in lumbar vertebral bodies. IMPRESSION: Cardiomegaly. There are no signs of pulmonary edema or focal pulmonary consolidation. Electronically Signed   By: Elmer Picker M.D.   On: 03/23/2023 17:03    Procedures .Ortho Injury Treatment  Date/Time: 03/23/2023 7:46 PM  Performed by: Kateri Plummer, PA-C Authorized by: Kateri Plummer, PA-C   Consent:    Consent obtained:  Verbal   Consent given by:  Patient   Risks discussed:  FractureInjury location: wrist Location details: right wrist Injury type: fracture Fracture type: distal radius Pre-procedure neurovascular assessment: neurovascularly intact Pre-procedure distal perfusion: normal Pre-procedure neurological function: normal Pre-procedure range of motion: reduced Anesthesia: hematoma block  Anesthesia: Local anesthesia used: yes Local Anesthetic: lidocaine 2% without epinephrine Anesthetic total: 6 mL  Patient sedated: NoManipulation performed: yes Skin traction used: yes Skeletal traction used: yes Reduction successful: yes X-ray confirmed reduction:  yes Immobilization: splint Splint type: sugar tong Splint Applied by: Ortho Tech Supplies used: Ortho-Glass Post-procedure neurovascular assessment: post-procedure neurovascularly intact Post-procedure distal perfusion: normal Post-procedure neurological function: normal Post-procedure range of motion: normal   ..Laceration Repair  Date/Time: 03/23/2023 9:18 PM  Performed by: Kateri Plummer, PA-C Authorized by: Kateri Plummer, PA-C   Consent:    Consent obtained:  Verbal   Consent given by:  Patient   Risks, benefits, and alternatives were discussed: yes     Risks discussed:  Infection, need for additional repair and poor cosmetic result Universal protocol:    Patient identity confirmed:  Provided demographic data Anesthesia:    Anesthesia method:  None Laceration details:    Location:  Lip   Lip location:  Upper exterior lip   Length (cm):  0.5 Exploration:  Hemostasis achieved with:  Direct pressure Treatment:    Area cleansed with:  Saline   Amount of cleaning:  Standard Skin repair:    Repair method:  Tissue adhesive Approximation:    Approximation:  Close   Vermilion border well-aligned: yes   Repair type:    Repair type:  Simple Post-procedure details:    Dressing:  Adhesive bandage   Procedure completion:  Tolerated well, no immediate complications     Medications Ordered in ED Medications  oxyCODONE-acetaminophen (PERCOCET/ROXICET) 5-325 MG per tablet 1 tablet (has no administration in time range)  fentaNYL (SUBLIMAZE) injection 50 mcg (50 mcg Intravenous Given 03/23/23 1554)  lidocaine (XYLOCAINE) 2 % (with pres) injection 200 mg (200 mg Infiltration Given 03/23/23 1805)  Tdap (BOOSTRIX) injection 0.5 mL (0.5 mLs Intramuscular Given 03/23/23 1806)  HYDROmorphone (DILAUDID) injection 1 mg (1 mg Intravenous Given 03/23/23 1913)    ED Course/ Medical Decision Making/ A&P                             Medical Decision Making Amount and/or  Complexity of Data Reviewed Radiology: ordered.  Risk Prescription drug management.   This patient is a 77 y.o. female  who presents to the ED for concern of fall with injuries. No LOC. Not on blood thinners.    Past Medical History / Co-morbidities: chronic back pain, thyroid disease, depression, anxiety, hyperlipidemia, GERD, hypertension, fibromyalgia, osteoporosis, type 2 diabetes, sleep apnea on CPAP  Physical Exam: Physical exam performed. The pertinent findings include: Normal vital signs, no acute distress.  Bruising noted to the forehead and the bridge of the nose.  Chest wall and pelvis stable, abdomen soft and nontender.  Obvious deformity noted to the right wrist.  Can range the digits, and neurovascularly intact.  Lab Tests/Imaging studies: I personally interpreted labs/imaging and the pertinent results include: CT of head and maxillofacial without acute abnormalities.  CT cervical spine with small oblique fracture through the C2 vertebral body, with minimal displacement.  Chest x-ray, right shoulder and right elbow x-rays unremarkable.  X-ray of the right wrist shows comminuted intra-articular distal right radial fracture with severe impaction and dorsal angulation.  Post reduction x-ray of right wrist with improvement in fracture fragments.   I agree with the radiologist interpretation.  Medications: I ordered medication including fentanyl, Dilaudid, lidocaine.  I have reviewed the patients home medicines and have made adjustments as needed.  Consultations obtained: I consulted with neurosurgeon Dr Christella Noa about patient's C2 fracture, they recommended patient to be placed in hard collar and follow up with their practice in a few weeks.  I consulted with hand surgeon Dr Ala Bent who agreed with plan to attempt reduction and will see as outpatient. Reviewed post reduction images and is pleased with results.  Disposition: After consideration of the diagnostic results and  the patients response to treatment, I feel that emergency department workup does not suggest an emergent condition requiring admission or immediate intervention beyond what has been performed at this time. The plan is: discharge to home with follow up for neurosurgery and hand surgery, pain medication as needed, and wound care instructions for facial lacerations. The patient is safe for discharge and has been instructed to return immediately for worsening symptoms, change in symptoms or any other concerns.  I discussed this case with my attending physician Dr. Zenia Resides who cosigned this note including patient's presenting symptoms, physical exam, and planned diagnostics and interventions. Attending physician stated  agreement with plan or made changes to plan which were implemented.   Final Clinical Impression(s) / ED Diagnoses Final diagnoses:  Fall, initial encounter  Closed displaced fracture of second cervical vertebra, unspecified fracture morphology, initial encounter [facility])  Closed Colles' fracture of right radius, initial encounter    Rx / DC Orders ED Discharge Orders          Ordered    oxyCODONE-acetaminophen (PERCOCET/ROXICET) 5-325 MG tablet  Every 6 hours PRN        03/23/23 2107           Portions of this report may have been transcribed using voice recognition software. Every effort was made to ensure accuracy; however, inadvertent computerized transcription errors may be present.    [name] 03/23/23 2122    Lacretia Leigh, MD 03/26/23 [phone]

## 2023-06-16 ENCOUNTER — Encounter (HOSPITAL_COMMUNITY): Payer: Self-pay

## 2023-06-16 ENCOUNTER — Ambulatory Visit (HOSPITAL_COMMUNITY)
Admission: EM | Admit: 2023-06-16 | Discharge: 2023-06-16 | Disposition: A | Discharge status: Home / Self Care | Payer: 59

## 2023-06-16 DIAGNOSIS — M545 Low back pain, unspecified: Secondary | ICD-10-CM

## 2023-06-16 MED ORDER — KETOROLAC TROMETHAMINE 30 MG/ML IJ SOLN
30.0000 mg | Freq: Once | INTRAMUSCULAR | Status: AC
  Administered 2023-06-16: 30 mg via INTRAMUSCULAR

## 2023-06-16 MED ORDER — PREDNISONE 20 MG PO TABS: 40.0000 mg | ORAL_TABLET | Freq: Every day | ORAL | 0 refills | Status: AC

## 2023-06-16 MED ORDER — KETOROLAC TROMETHAMINE 30 MG/ML IJ SOLN
INTRAMUSCULAR | Status: AC
  Filled 2023-06-16: qty 1

## 2023-06-16 MED ORDER — LACTULOSE 10 GM/15ML PO SOLN: 10.0000 g | Freq: Every day | ORAL | 0 refills | Status: AC | PRN

## 2023-06-16 NOTE — ED Triage Notes (Signed)
Patient fell 3/39/24 and broke the right arm and wrist. Was placed on hydrocodone for pain.   Currently having back pain for 1 week. States taking stool softeners that are helping the constipation bu still having back pain. No urinary symptoms.   Patient states when she had her first BM after constipation there was a lot of straining.

## 2023-06-16 NOTE — Discharge Instructions (Signed)
Evaluated for your back pain, pain appears to be over the low back muscles, possible causes are muscular pain or related to your constipation, I have a low suspicion that there is a bladder infection as you have no urinary symptoms at this time  You have been given an injection of Toradol here in the office for pain, daily start some relief in about 30 minutes to an hour, may take Tylenol in addition to this medicine as needed  Starting tomorrow to prednisone every morning for 5 days to help with back pain  Begin use of lactulose every morning for up to 3 days, stop after you have had a full complete bowel movement  ensure that you have access center close to a bathroom after taking medication as it may cause urgency  Ensure that you are drinking a good amount of fluids to stay hydrated while you are taking medicine to make you have a bowel movement  If your symptoms continue to persist please follow-up with your primary doctor in the next 1 to 2 weeks

## 2023-06-16 NOTE — ED Provider Notes (Signed)
[facility]    CSN: [id number] Arrival date & time: 06/16/23  1403      History   Chief Complaint Chief Complaint  Patient presents with   Back Pain    HPI [name] is a 77 y.o. female.   Patient presents for evaluation of constant centralized low back pain beginning 7 days ago.  Radiates to the bilateral sides, can be felt with all movement, not exacerbated by anything particular.  Denies injury or trauma.  Has been taking narcotic for treatment of wrist fracture, last dose 1 day ago, does cause intermittent constipation.  Last bowel movement this morning, endorses only a small amount, denies straining.  Has been using over-the-counter stool softeners and laxatives with last administration 1 day ago.  Denies nausea, vomiting or abdominal pain.  Has been able to tolerate food and liquids.  Denies numbness, tingling or urinary incontinence.     Past Medical History:  Diagnosis Date   Allergy    SEASONAL-flonase daily as well as Claritin   Anemia    takes Ferrous Sulfate daily   Anxiety    takes Celexa every evening   Arthritis    BACK/KNEE   Cataract    IMMATURE AND ON LEFT EYE SHE THINKS   Chronic back pain    Chronic back pain    L1 FRACTURE   Depression    takes Wellbutrin daily   Diabetes mellitus without complication (HCC)    type II   Dyspnea    with exertion   Fibromyalgia    GERD (gastroesophageal reflux disease)    takes Omeprazole daily and Zantac at bedtime   H/O hiatal hernia    History of colon polyps    History of shingles    Hyperlipidemia    takes Pravastatin daily as well as Questran   Hypertension    takes Diovan daily   Hypothyroidism    Insomnia    takes trazodone nightly   Joint pain    Muscle spasm    takes Robaxin daily as needed   Nausea    takes Zofran daily as needed   Nocturia    Numbness and tingling    LEGS   Osteoporosis    takes fosamax   Sleep apnea    uses cpap   Thyroid disease    takes Synthroid  daily    Patient Active Problem List   Diagnosis Date Noted   Hypothyroidism 05/03/2021   Impaired fasting glucose 05/03/2021   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   Pathological fracture of vertebra 05/03/2021   Vitamin D deficiency 05/03/2021   Comorbid sleep-related hypoventilation 09/24/2020   Non-restorative sleep 09/03/2020   Snoring 09/03/2020   PLMD (periodic limb movement disorder) 09/03/2020   Status post total knee replacement using cement, left 03/16/2020   Primary osteoarthritis of left knee 01/05/2020   BMI 40.0-44.9, adult (HCC) 03/17/2019   S/P total hip arthroplasty 10/18/2016   Lumbar radiculopathy 01/28/2016   Hypersomnia with sleep apnea 07/20/2015   Hypersomnia, persistent 07/20/2015   OSA (obstructive sleep apnea) 07/20/2015   Lumbar compression fracture (HCC) 12/21/2014   Low back pain 11/26/2014   Osteoporosis 11/26/2014   Class 3 severe obesity due to excess calories with serious comorbidity and body mass index (BMI) of 40.0 to 44.9 in adult [facility]) 03/07/2013   Obstructive sleep apnea 03/07/2013   Fibromyalgia 03/07/2013   Other and unspecified hyperlipidemia 03/07/2013   Hx of gastritis 03/07/2013   S/P  cholecystectomy 03/07/2013   Chest pain 01/07/2013   Depression 11/28/2006   Essential (primary) hypertension 11/28/2006    Past Surgical History:  Procedure Laterality Date   ABDOMINAL HYSTERECTOMY     CARDIAC CATHETERIZATION     CARPAL TUNNEL RELEASE     right and lt   CHOLECYSTECTOMY     COLONOSCOPY     COLONOSCOPY     EYE SURGERY Bilateral    Cataract with lens   FOOT SURGERY Bilateral    FRACTURE SURGERY     rt hand.  no metal   FRACTURE SURGERY     lr wrist. no metal   HAND SURGERY Right 06/1985   HAND SURGERY Left    JOINT REPLACEMENT Right    THR   KNEE ARTHROSCOPY Left    KNEE ARTHROSCOPY Bilateral    KYPHOPLASTY N/A 12/21/2014   Procedure: KYPHOPLASTY LUMBAR ONE;  Surgeon: Tressie Stalker, MD;   Location: [facility];  Service: Neurosurgery;  Laterality: N/A;  L1 Kyphoplasty   ORIF FINGER FRACTURE  04/11/2012   Procedure: OPEN REDUCTION INTERNAL FIXATION (ORIF) METACARPAL (FINGER) FRACTURE;  Surgeon: Loreta Ave, MD;  Location: [facility];  Service: Orthopedics;  Laterality: Left;  orif left hand 4th metacarpal    PITUITARY SURGERY  07/2007   tumor resection, BENIGN   RECTOCELE REPAIR     SHOULDER ARTHROSCOPY WITH DISTAL CLAVICLE RESECTION Right 05/20/2015   Procedure: SHOULDER ARTHROSCOPY WITH DISTAL CLAVICLE RESECTION DEBRIDEMENT PARTIAL ROTATOR CUFF LABRAL TEAR REMOVAL LOOSE BODIES;  Surgeon: Mckinley Jewel, MD;  Location: [facility];  Service: Orthopedics;  Laterality: Right;   TONSILLECTOMY     TOTAL HIP ARTHROPLASTY Right 10/18/2016   Procedure: TOTAL HIP ARTHROPLASTY ANTERIOR APPROACH;  Surgeon: Loreta Ave, MD;  Location: [facility];  Service: Orthopedics;  Laterality: Right;   TOTAL KNEE ARTHROPLASTY Left 03/16/2020   Procedure: TOTAL KNEE ARTHROPLASTY;  Surgeon: Christena Flake, MD;  Location: [facility];  Service: Orthopedics;  Laterality: Left;   UPPER GASTROINTESTINAL ENDOSCOPY  02/2013   WRIST SURGERY Left     OB History   No obstetric history on file.      Home Medications    Prior to Admission medications   Medication Sig Start Date End Date Taking? Authorizing Provider  acetaminophen (TYLENOL) 500 MG tablet Take 1 tablet (500 mg total) by mouth every 6 (six) hours as needed. Patient taking differently: Take 500 mg by mouth as needed. 08/12/20  Yes Avegno, Zachery Dakins, FNP  ALPRAZolam (XANAX) 0.5 MG tablet Take 0.5 mg by mouth as needed. 01/25/21  Yes [provider]  Biotin 1000 MCG tablet 1 tablet   Yes [provider]  buPROPion (WELLBUTRIN XL) 300 MG 24 hr tablet Take 300 mg by mouth every morning. 05/12/21  Yes [provider]  Calcium Carbonate-Vitamin D (CALCIUM 600/VITAMIN D PO) Take 1 tablet by mouth  daily.   Yes [provider]  Calcium Carbonate-Vitamin D 600-200 MG-UNIT CAPS Take 1 tablet by mouth daily.   Yes [provider]  cephALEXin (KEFLEX) 500 MG capsule Take by mouth. 06/11/23 06/18/23 Yes [provider]  citalopram (CELEXA) 40 MG tablet Take 40 mg by mouth daily. In the afternoon   Yes [provider]  erythromycin ophthalmic ointment Place a 1/2 inch ribbon of ointment into the both eyelids and around eyes. 09/12/20  Yes Wurst, [place], PA-C  ferrous sulfate 325 (65 FE) MG tablet Take 325 mg by mouth as needed.  Yes [provider]  fluticasone (FLONASE) 50 MCG/ACT nasal spray Place 2 sprays into the nose daily.   Yes [provider]  HYDROcodone-acetaminophen (NORCO/VICODIN) 5-325 MG tablet Take 1-2 tablets by mouth every 6 (six) hours as needed. 07/01/20  Yes [provider]  ibuprofen (ADVIL) 200 MG tablet Take 400 mg by mouth as needed for moderate pain.   Yes [provider]  levothyroxine (SYNTHROID) 125 MCG tablet Take 125 mcg by mouth daily before breakfast.    Yes [provider]  loratadine (CLARITIN) 10 MG tablet Take 10 mg by mouth daily. In the morning   Yes [provider]  Misc Natural Products (OSTEO BI-FLEX ADV JOINT SHIELD PO) Take 2 tablets by mouth daily.   Yes [provider]  Omega-3 Fatty Acids (FISH OIL) 500 MG CAPS Take 500 mg by mouth daily.    Yes [provider]  omeprazole (PRILOSEC) 40 MG capsule Take 1 capsule (40 mg total) by mouth daily before breakfast. Patient taking differently: Take 40 mg by mouth as needed. 01/07/13  Yes Rachael Fee, MD  potassium chloride (KLOR-CON) 20 MEQ packet Take by mouth.   Yes [provider]  potassium chloride SA (K-DUR,KLOR-CON) 20 MEQ tablet Take 40 mEq by mouth 2 (two) times daily.   Yes [provider]  pravastatin (PRAVACHOL) 20 MG tablet Take 20 mg by mouth at bedtime.    Yes [provider]  traMADol (ULTRAM) 50 MG tablet Take 1-2 tablets (50-100 mg total) by mouth every 6 (six) hours as needed for moderate pain. 03/18/20  Yes Anson Oregon, PA-C  traZODone (DESYREL) 50 MG tablet Take 50 mg by mouth at bedtime.   Yes [provider]  valsartan-hydrochlorothiazide (DIOVAN-HCT) 160-25 MG tablet Take 1 tablet by mouth daily.    Yes [provider]    Family History Family History  Problem Relation Age of Onset   Hypertension Mother    Diabetes Mother    Lung cancer Sister    Heart attack Brother        x 2   COPD Brother    Lung cancer Paternal Aunt    Cancer Paternal Uncle    Cancer Maternal Grandfather        back   Sleep apnea Neg Hx     Social History Social History   Tobacco Use   Smoking status: Former   Smokeless tobacco: Never   Tobacco comments:    as a young erson, off and on , never had a habit  Vaping Use   Vaping Use: Never used  Substance Use Topics   Alcohol use: No   Drug use: No     Allergies   Sulfa antibiotics, Penicillins, Cyclobenzaprine, and Sulfamethoxazole-trimethoprim   Review of Systems Review of Systems  Respiratory: Negative.    Cardiovascular: Negative.   Musculoskeletal:  Positive for back pain. Negative for arthralgias, gait problem, joint swelling, myalgias, neck pain and neck stiffness.  Skin: Negative.      Physical Exam Triage Vital Signs ED Triage Vitals  Enc Vitals Group     BP 06/16/23 1451 108/68     Pulse Rate 06/16/23 1451 76     Resp 06/16/23 1451 18     Temp 06/16/23 1451 98.4 F (36.9 C)     Temp Source 06/16/23 1451 Oral     SpO2 06/16/23 1451 94 %     Weight --      Height --  Head Circumference --      Peak Flow --      Pain Score 06/16/23 1449 10     Pain Loc --      Pain Edu? --      Excl. in GC? --    No data found.  Updated Vital Signs BP 108/68 (BP Location: Left Wrist)   Pulse 76   Temp 98.4 F (36.9 C) (Oral)   Resp 18   SpO2 94%    Visual Acuity Right Eye Distance:   Left Eye Distance:   Bilateral Distance:    Right Eye Near:   Left Eye Near:    Bilateral Near:     Physical Exam Constitutional:      Appearance: Normal appearance.  Eyes:     Extraocular Movements: Extraocular movements intact.  Abdominal:     General: Abdomen is flat. Bowel sounds are normal. There is no distension.     Palpations: Abdomen is soft.     Tenderness: There is no abdominal tenderness. There is no right CVA tenderness or left CVA tenderness.  Musculoskeletal:     Comments: Tenderness is generalized to the lumbar region without ecchymosis swelling or deformity, unable to assess straight leg test, pain elicited with twisting turning and bending  Neurological:     General: No focal deficit present.     Mental Status: She is alert and oriented to person, place, and time.      UC Treatments / Results  Labs (all labs ordered are listed, but only abnormal results are displayed) Labs Reviewed - No data to display  EKG   Radiology No results found.  Procedures Procedures (including critical care time)  Medications Ordered in UC Medications - No data to display  Initial Impression / Assessment and Plan / UC Course  I have reviewed the triage vital signs and the nursing notes.  Pertinent labs & imaging results that were available during my care of the patient were reviewed by me and considered in my medical decision making (see chart for details).  Acute bilateral low back pain without sciatica  Etiology is muscular versus constipation, discussed this with patient, denies urinary symptoms therefore urinalysis deferred, Toradol injection given in office and prescribed prednisone and lactulose for outpatient use, discussed administration as well as additional supportive measures, advised follow-up with PCP 1 week if symptoms continue to persist Final Clinical Impressions(s) / UC Diagnoses   Final diagnoses:  None    Discharge Instructions   None    ED Prescriptions   None    PDMP not reviewed this encounter.   Valinda Hoar, [place] 06/16/23 [phone]

## 2023-06-24 ENCOUNTER — Encounter (HOSPITAL_COMMUNITY): Payer: Self-pay

## 2023-06-24 ENCOUNTER — Other Ambulatory Visit: Payer: Self-pay

## 2023-06-24 ENCOUNTER — Emergency Department (HOSPITAL_COMMUNITY): Payer: 59

## 2023-06-24 ENCOUNTER — Emergency Department (HOSPITAL_COMMUNITY)
Admission: EM | Admit: 2023-06-24 | Discharge: 2023-06-24 | Disposition: A | Discharge status: Home / Self Care | Payer: 59 | Attending: Emergency Medicine | Admitting: Emergency Medicine

## 2023-06-24 DIAGNOSIS — N3000 Acute cystitis without hematuria: Secondary | ICD-10-CM

## 2023-06-24 DIAGNOSIS — K5900 Constipation, unspecified: Secondary | ICD-10-CM | POA: Diagnosis not present

## 2023-06-24 DIAGNOSIS — S22089A Unspecified fracture of T11-T12 vertebra, initial encounter for closed fracture: Secondary | ICD-10-CM

## 2023-06-24 DIAGNOSIS — X58XXXA Exposure to other specified factors, initial encounter: Secondary | ICD-10-CM | POA: Insufficient documentation

## 2023-06-24 DIAGNOSIS — K7460 Unspecified cirrhosis of liver: Secondary | ICD-10-CM | POA: Diagnosis not present

## 2023-06-24 DIAGNOSIS — S29002A Unspecified injury of muscle and tendon of back wall of thorax, initial encounter: Secondary | ICD-10-CM | POA: Diagnosis present

## 2023-06-24 DIAGNOSIS — I700 Atherosclerosis of aorta: Secondary | ICD-10-CM | POA: Insufficient documentation

## 2023-06-24 DIAGNOSIS — R300 Dysuria: Secondary | ICD-10-CM | POA: Diagnosis not present

## 2023-06-24 DIAGNOSIS — K449 Diaphragmatic hernia without obstruction or gangrene: Secondary | ICD-10-CM | POA: Diagnosis not present

## 2023-06-24 LAB — CBC WITH DIFFERENTIAL/PLATELET
Abs Immature Granulocytes: 0.02 10*3/uL (ref 0.00–0.07)
Basophils Absolute: 0 10*3/uL (ref 0.0–0.1)
Basophils Relative: 0 %
Eosinophils Absolute: 0.1 10*3/uL (ref 0.0–0.5)
Eosinophils Relative: 1 %
HCT: 44.7 % (ref 36.0–46.0)
Hemoglobin: 14.5 g/dL (ref 12.0–15.0)
Immature Granulocytes: 0 %
Lymphocytes Relative: 18 %
Lymphs Abs: 2 10*3/uL (ref 0.7–4.0)
MCH: 28.3 pg (ref 26.0–34.0)
MCHC: 32.4 g/dL (ref 30.0–36.0)
MCV: 87.1 fL (ref 80.0–100.0)
Monocytes Absolute: 1 10*3/uL (ref 0.1–1.0)
Monocytes Relative: 9 %
Neutro Abs: 7.6 10*3/uL (ref 1.7–7.7)
Neutrophils Relative %: 72 %
Platelets: 202 10*3/uL (ref 150–400)
RBC: 5.13 MIL/uL — ABNORMAL HIGH (ref 3.87–5.11)
RDW: 14.7 % (ref 11.5–15.5)
WBC: 10.7 10*3/uL — ABNORMAL HIGH (ref 4.0–10.5)
nRBC: 0 % (ref 0.0–0.2)

## 2023-06-24 LAB — URINALYSIS, ROUTINE W REFLEX MICROSCOPIC
Bilirubin Urine: NEGATIVE
Glucose, UA: NEGATIVE mg/dL
Hgb urine dipstick: NEGATIVE
Ketones, ur: NEGATIVE mg/dL
Nitrite: NEGATIVE
Protein, ur: NEGATIVE mg/dL
Specific Gravity, Urine: 1.015 (ref 1.005–1.030)
WBC, UA: >50 WBC/hpf (ref 0–5)
pH: 5 (ref 5.0–8.0)

## 2023-06-24 LAB — COMPREHENSIVE METABOLIC PANEL
ALT: 25 U/L (ref 0–44)
AST: 32 U/L (ref 15–41)
Albumin: 3.9 g/dL (ref 3.5–5.0)
Alkaline Phosphatase: 100 U/L (ref 38–126)
Anion gap: 10 (ref 5–15)
BUN: 25 mg/dL — ABNORMAL HIGH (ref 8–23)
CO2: 24 mmol/L (ref 22–32)
Calcium: 9.3 mg/dL (ref 8.9–10.3)
Chloride: 104 mmol/L (ref 98–111)
Creatinine, Ser: 0.85 mg/dL (ref 0.44–1.00)
GFR, Estimated: >60 mL/min (ref >60)
Glucose, Bld: 84 mg/dL (ref 70–99)
Potassium: 3.4 mmol/L — ABNORMAL LOW (ref 3.5–5.1)
Sodium: 138 mmol/L (ref 135–145)
Total Bilirubin: 1.3 mg/dL — ABNORMAL HIGH (ref 0.3–1.2)
Total Protein: 7.1 g/dL (ref 6.5–8.1)

## 2023-06-24 LAB — LIPASE, BLOOD: Lipase: 31 U/L (ref 11–51)

## 2023-06-24 MED ORDER — ONDANSETRON HCL 4 MG/2ML IJ SOLN
4.0000 mg | Freq: Once | INTRAMUSCULAR | Status: AC
  Administered 2023-06-24: 4 mg via INTRAVENOUS
  Filled 2023-06-24: qty 2

## 2023-06-24 MED ORDER — IOHEXOL 300 MG/ML  SOLN
100.0000 mL | Freq: Once | INTRAMUSCULAR | Status: AC | PRN
  Administered 2023-06-24: 100 mL via INTRAVENOUS

## 2023-06-24 MED ORDER — MORPHINE SULFATE (PF) 4 MG/ML IV SOLN
4.0000 mg | Freq: Once | INTRAVENOUS | Status: AC
  Administered 2023-06-24: 4 mg via INTRAVENOUS
  Filled 2023-06-24: qty 1

## 2023-06-24 MED ORDER — POLYETHYLENE GLYCOL 3350 17 G PO PACK
17.0000 g | PACK | Freq: Every day | ORAL | Status: DC
  Administered 2023-06-24: 17 g via ORAL
  Filled 2023-06-24: qty 1

## 2023-06-24 MED ORDER — HYDROCODONE-ACETAMINOPHEN 5-325 MG PO TABS
2.0000 | ORAL_TABLET | Freq: Once | ORAL | Status: AC
  Administered 2023-06-24: 2 via ORAL
  Filled 2023-06-24: qty 2

## 2023-06-24 MED ORDER — CEPHALEXIN 500 MG PO CAPS: 500.0000 mg | ORAL_CAPSULE | Freq: Three times a day (TID) | ORAL | 0 refills | Status: AC

## 2023-06-24 MED ORDER — LACTULOSE 10 GM/15ML PO SOLN
10.0000 g | Freq: Once | ORAL | Status: AC
  Administered 2023-06-24: 10 g via ORAL
  Filled 2023-06-24: qty 30

## 2023-06-24 MED ORDER — ACETAMINOPHEN 325 MG PO TABS
650.0000 mg | ORAL_TABLET | Freq: Once | ORAL | Status: AC
  Administered 2023-06-24: 650 mg via ORAL
  Filled 2023-06-24: qty 2

## 2023-06-24 NOTE — Progress Notes (Signed)
Orthopedic Tech Progress Note Patient Details:  [name] 05/10/1946 [id number]  Ortho Devices Type of Ortho Device: Thoracolumbar corset (TLSO) Ortho Device/Splint Location: adjusted to pt, at bedside currently as pt did not want to wear it while lying supine d/t pain. Ortho Device/Splint Interventions: Ordered, Adjustment, Application   Post Interventions Patient Tolerated: Well Instructions Provided: Care of device, Adjustment of device  Jude Linck Carmine Savoy 06/24/2023, 3:08 PM

## 2023-06-24 NOTE — ED Notes (Signed)
Lab added culture from specimen sent for analysis. Marland Kitchen

## 2023-06-24 NOTE — ED Notes (Signed)
Assisted pt with fracture pan. Tolerated well.

## 2023-06-24 NOTE — ED Provider Notes (Cosign Needed Addendum)
EMERGENCY DEPARTMENT AT [facility] Provider Note   CSN: [id number] Arrival date & time: 06/24/23  1042     History  Chief Complaint  Patient presents with   Back Pain    [name] is a 77 y.o. female.  77 year old for female with past medical history significant for fibromyalgia, chronic low back pain, recent right wrist fracture presents today for concern of constipation, low back pain.  She states the low back pain radiates down left lower extremity as well as across her low back.  She has history of low back surgery due to compression fracture.  She states her last bowel movement was Wednesday.  She has been taking narcotic pain medication for her wrist fracture.  Last dose last night.  She states she is been taking 1 capful of MiraLAX daily, as well as 1 dose of stool softener daily for the past 3 days.  Denies nausea or vomiting.  No dysuria.  The history is provided by the patient. No language interpreter was used.       Home Medications Prior to Admission medications   Medication Sig Start Date End Date Taking? Authorizing Provider  acetaminophen (TYLENOL) 500 MG tablet Take 1 tablet (500 mg total) by mouth every 6 (six) hours as needed. Patient taking differently: Take 500 mg by mouth as needed. 08/12/20   Avegno, Zachery Dakins, FNP  ALPRAZolam Prudy Feeler) 0.5 MG tablet Take 0.5 mg by mouth as needed. 01/25/21   [provider]  Biotin 1000 MCG tablet 1 tablet    [provider]  buPROPion (WELLBUTRIN XL) 300 MG 24 hr tablet Take 300 mg by mouth every morning. 05/12/21   [provider]  Calcium Carbonate-Vitamin D (CALCIUM 600/VITAMIN D PO) Take 1 tablet by mouth daily.    [provider]  Calcium Carbonate-Vitamin D 600-200 MG-UNIT CAPS Take 1 tablet by mouth daily.    [provider]  citalopram (CELEXA) 40 MG tablet Take 40 mg by mouth daily. In the afternoon    [provider]  erythromycin ophthalmic  ointment Place a 1/2 inch ribbon of ointment into the both eyelids and around eyes. 09/12/20   Wurst, [place], PA-C  ferrous sulfate 325 (65 FE) MG tablet Take 325 mg by mouth as needed.    [provider]  fluticasone (FLONASE) 50 MCG/ACT nasal spray Place 2 sprays into the nose daily.    [provider]  HYDROcodone-acetaminophen (NORCO/VICODIN) 5-325 MG tablet Take 1-2 tablets by mouth every 6 (six) hours as needed. 07/01/20   [provider]  ibuprofen (ADVIL) 200 MG tablet Take 400 mg by mouth as needed for moderate pain.    [provider]  lactulose (CHRONULAC) 10 GM/15ML solution Take 15 mLs (10 g total) by mouth daily as needed for mild constipation. 06/16/23   Valinda Hoar, NP  levothyroxine (SYNTHROID) 125 MCG tablet Take 125 mcg by mouth daily before breakfast.     [provider]  loratadine (CLARITIN) 10 MG tablet Take 10 mg by mouth daily. In the morning    [provider]  Misc Natural Products (OSTEO BI-FLEX ADV JOINT SHIELD PO) Take 2 tablets by mouth daily.    [provider]  Omega-3 Fatty Acids (FISH OIL) 500 MG CAPS Take 500 mg by mouth daily.     [provider]  omeprazole (PRILOSEC) 40 MG capsule Take 1 capsule (40 mg total) by mouth daily before breakfast. Patient taking differently: Take 40  mg by mouth as needed. 01/07/13   Rachael Fee, MD  potassium chloride (KLOR-CON) 20 MEQ packet Take by mouth.    [provider]  potassium chloride SA (K-DUR,KLOR-CON) 20 MEQ tablet Take 40 mEq by mouth 2 (two) times daily.    [provider]  pravastatin (PRAVACHOL) 20 MG tablet Take 20 mg by mouth at bedtime.     [provider]  predniSONE (DELTASONE) 20 MG tablet Take 2 tablets (40 mg total) by mouth daily. 06/16/23   White, Elita Boone, NP  traMADol (ULTRAM) 50 MG tablet Take 1-2 tablets (50-100 mg total) by mouth every 6 (six) hours as needed for moderate pain. 03/18/20   Anson Oregon, PA-C  traZODone (DESYREL) 50 MG tablet Take 50 mg by mouth at bedtime.    [provider]  valsartan-hydrochlorothiazide (DIOVAN-HCT) 160-25 MG tablet Take 1 tablet by mouth daily.     [provider]      Allergies    Sulfa antibiotics, Penicillins, Cyclobenzaprine, and Sulfamethoxazole-trimethoprim    Review of Systems   Review of Systems  Constitutional:  Negative for chills and fever.  Respiratory:  Negative for shortness of breath.   Cardiovascular:  Negative for chest pain.  Gastrointestinal:  Positive for abdominal pain and constipation. Negative for nausea and vomiting.  Genitourinary:  Negative for dysuria and flank pain.  Musculoskeletal:  Positive for back pain.  All other systems reviewed and are negative.   Physical Exam Updated Vital Signs BP 136/70   Pulse 70   Temp 98.4 F (36.9 C) (Oral)   Resp 16   Ht 4' 11.5" (1.511 m)   Wt 84.4 kg   SpO2 100%   BMI 36.94 kg/m  Physical Exam Vitals and nursing note reviewed.  Constitutional:      General: She is not in acute distress.    Appearance: Normal appearance. She is not ill-appearing.  HENT:     Head: Normocephalic and atraumatic.     Nose: Nose normal.  Eyes:     Conjunctiva/sclera: Conjunctivae normal.  Cardiovascular:     Rate and Rhythm: Normal rate.  Pulmonary:     Effort: Pulmonary effort is normal. No respiratory distress.  Abdominal:     General: There is no distension.     Palpations: Abdomen is soft.     Tenderness: There is no abdominal tenderness. There is no guarding.  Musculoskeletal:        General: Tenderness present. No deformity. Normal range of motion.     Comments: Cervical, thoracic spine without tenderness to palpation.  Lumbar spine with tenderness to palpation but no step-offs or crepitus.  Paraspinal muscles with some tenderness to palpation over the lumbar paraspinal muscles.  Good range of motion bilateral lower extremities.  Skin:    Findings:  No rash.  Neurological:     General: No focal deficit present.     Mental Status: She is alert and oriented to person, place, and time. Mental status is at baseline.     ED Results / Procedures / Treatments   Labs (all labs ordered are listed, but only abnormal results are displayed) Labs Reviewed  CBC WITH DIFFERENTIAL/PLATELET  COMPREHENSIVE METABOLIC PANEL  LIPASE, BLOOD  URINALYSIS, ROUTINE W REFLEX MICROSCOPIC    EKG None  Radiology No results found.  Procedures Procedures    Medications Ordered in ED Medications  polyethylene glycol (MIRALAX / GLYCOLAX) packet 17 g (has no administration in time range)  lactulose (CHRONULAC) 10 GM/15ML  solution 10 g (has no administration in time range)  acetaminophen (TYLENOL) tablet 650 mg (has no administration in time range)    ED Course/ Medical Decision Making/ A&P                             Medical Decision Making Amount and/or Complexity of Data Reviewed Labs: ordered. Radiology: ordered.  Risk OTC drugs. Prescription drug management.   77 year old female presents with low back pain.  Radiates across the back as well as down left lower extremity.  Neurovascularly intact.  Also reports constipation.  Has received epidural injections for her chronic low back pain in the past.  Has been taking narcotic pain medication for recent wrist fracture.  Will obtain CT abdomen pelvis, CT L-spine, blood work, UA.  Will provide pain control, provide bowel regimen and reevaluate.  CT abdomen pelvis without acute intra-abdominal findings. CT demonstrates T12 vertebral fracture with less than 10% decrease in height.  There is significant central spinal spinal canal stenosis at T12-L1 with mild to moderate encroachment of the neural foramina at multiple levels and lumbar spine.  However patient does not have any major symptoms.  She states she has been able to ambulate however with significant pain.  But denies any bowel or bladder  dysfunction, saddle anesthesia.  Has good range of motion bilateral lower extremities.  Will provide TLSO brace.  Referral to spine clinic given.  Discussed with attending.  Discussed need for MRI but given no new motor symptoms we will defer.  She will follow-up with spine clinic.  Information given.  Discharged in stable condition.  Patient lives with her sister.  Who assists patient.  Daughter is at bedside who also helps out.  They are both in agreement with this plan and feels safe to go home.   Final Clinical Impression(s) / ED Diagnoses Final diagnoses:  Closed fracture of twelfth thoracic vertebra, unspecified fracture morphology, initial encounter (HCC)  Constipation, unspecified constipation type    Rx / DC Orders ED Discharge Orders     None         Marita Kansas, PA-C 06/24/23 1549    Marita Kansas, PA-C 06/24/23 1549    Marita Kansas, PA-C 06/24/23 1601    Terald Sleeper, MD 06/25/23 [phone]

## 2023-06-24 NOTE — ED Notes (Signed)
Pt teaching provided on medications that may cause drowsiness. Pt instructed not to drive or operate heavy machinery while taking the prescribed medication. Pt verbalized understanding.   Pt provided discharge instructions and prescription information. Pt was given the opportunity to ask questions and questions were answered.   

## 2023-06-24 NOTE — Discharge Instructions (Addendum)
Your workup showed you have a fracture of your T12 vertebra.  Has a less than 10%.  We have given you a TLSO brace.  Follow-up with the spine clinic that I have listed above.  Continue taking your pain medication.  Take Tylenol mostly.  You can take 1000 mg every 8 hours.  Try to limit the use of the narcotic pain medication if he can have that will make the constipation worse.  Continue taking MiraLAX.  You can take 2 -3 capfuls per day until you get loose stools and then you can cut back to 1 capful per day.  You can take Senokot in addition to this.  Return for any worsening or concerning symptoms.  Your urine also shows some signs of a urinary tract infection.  I have sent a culture to confirm if there is a true infection.  It will take 2 to 3 days for this to result.  In the mean time I have sent antibiotic into the pharmacy for you to treat this.

## 2023-06-24 NOTE — ED Triage Notes (Signed)
Patient BIB [facility] from home. Complaining of mid centralized back pain that radiates to the ride side of her back. Last bowel movement was Wednesday.

## 2023-06-26 LAB — URINE CULTURE: Culture: 10000 — AB

## 2023-06-27 ENCOUNTER — Emergency Department (HOSPITAL_COMMUNITY)
Admission: EM | Admit: 2023-06-27 | Discharge: 2023-06-28 | Disposition: A | Discharge status: Home / Self Care | Payer: 59 | Attending: Emergency Medicine | Admitting: Emergency Medicine

## 2023-06-27 ENCOUNTER — Telehealth (HOSPITAL_BASED_OUTPATIENT_CLINIC_OR_DEPARTMENT_OTHER): Payer: Self-pay

## 2023-06-27 ENCOUNTER — Other Ambulatory Visit: Payer: Self-pay

## 2023-06-27 ENCOUNTER — Emergency Department (HOSPITAL_COMMUNITY): Payer: 59

## 2023-06-27 ENCOUNTER — Encounter (HOSPITAL_COMMUNITY): Payer: Self-pay

## 2023-06-27 DIAGNOSIS — M25551 Pain in right hip: Secondary | ICD-10-CM | POA: Diagnosis present

## 2023-06-27 DIAGNOSIS — Z79899 Other long term (current) drug therapy: Secondary | ICD-10-CM | POA: Insufficient documentation

## 2023-06-27 NOTE — Progress Notes (Signed)
ED Antimicrobial Stewardship Positive Culture Follow Up   [name] is an 77 y.o. female who presented to [facility] on 06/24/2023 with a chief complaint of  Chief Complaint  Patient presents with   Back Pain    Recent Results (from the past 720 hour(s))  Urine Culture     Status: Abnormal   Collection Time: 06/24/23 12:04 PM   Specimen: Urine, Clean Catch  Result Value Ref Range Status   Specimen Description   Final    URINE, CLEAN CATCH Performed at [facility], [address] [place], [place] [address]    Special Requests   Final    NONE Performed at  [facility], [address] [place], [place] [address]    Culture 10,000 COLONIES/mL ESCHERICHIA COLI (A)  Final   Report Status 06/26/2023 FINAL  Final   Organism ID, Bacteria ESCHERICHIA COLI (A)  Final      Susceptibility   Escherichia coli - MIC*    AMPICILLIN <=2 SENSITIVE Sensitive     CEFAZOLIN <=4 SENSITIVE Sensitive     CEFEPIME <=0.12 SENSITIVE Sensitive     CEFTRIAXONE <=0.25 SENSITIVE Sensitive     CIPROFLOXACIN <=0.25 SENSITIVE Sensitive     GENTAMICIN <=1 SENSITIVE Sensitive     IMIPENEM <=0.25 SENSITIVE Sensitive     NITROFURANTOIN <=16 SENSITIVE Sensitive     TRIMETH/SULFA <=20 SENSITIVE Sensitive     AMPICILLIN/SULBACTAM <=2 SENSITIVE Sensitive     PIP/TAZO <=4 SENSITIVE Sensitive     * 10,000 COLONIES/mL ESCHERICHIA COLI    [x]  No treatment indicated - asymptomatic bacteruria  15 YOF who presented with back pain and work-up showing new comminuted fracture and enhancement of neural foramina. No urinary symptoms, no fever/WBC, treatment triggered of of +UA for pyuria.   Discussed with ED provider and okay to discontinue Keflex at this time - only grew 10k E.coli  Needs additional follow-up: Call patient to inform them to stop Keflex - not indicated or needed  ED Provider: Coralee Pesa, DO  Rolley Sims 06/27/2023, 8:48 AM Clinical  Pharmacist Monday - Friday phone -  [phone] Saturday - Sunday phone - [phone]

## 2023-06-27 NOTE — Telephone Encounter (Signed)
Post ED Visit - Positive Culture Follow-up: Unsuccessful Patient Follow-up  Culture assessed and recommendations reviewed by:  []  Enzo Bi, Pharm.D. []  Celedonio Miyamoto, Pharm.D., BCPS AQ-ID []  Garvin Fila, Pharm.D., BCPS []  Georgina Pillion, Pharm.D., BCPS []  [place], [address].D., BCPS, AAHIVP []  Estella Husk, Pharm.D., BCPS, AAHIVP []  Sherlynn Carbon, PharmD []  Pollyann Samples, PharmD, BCPS  Positive urine culture  []  Patient discharged without antimicrobial prescription and treatment is now indicated []  Organism is resistant to prescribed ED discharge antimicrobial []  Patient with positive blood cultures  Plan: Call patient to discontinue Keflex, asymptomatic bacteruria.  Reviewed by Coralee Pesa, MD    Unable to contact patient after 3 attempts, letter will be sent to address on file  Sandria Senter 06/27/2023, 1:37 PM

## 2023-06-27 NOTE — ED Triage Notes (Signed)
BIB EMS from home for chronic right hip and back pain since fall in March. Diagnosed with T12 fracture and given a back brace. Pt has back brace on. Pt reports hip pain is worse tonight than normal.  of fentanyl with EMS and states little relief.

## 2023-06-28 ENCOUNTER — Telehealth (HOSPITAL_BASED_OUTPATIENT_CLINIC_OR_DEPARTMENT_OTHER): Payer: Self-pay

## 2023-06-28 MED ORDER — METHYLPREDNISOLONE 4 MG PO TBPK: ORAL_TABLET | ORAL | 0 refills | Status: AC

## 2023-06-28 NOTE — ED Provider Notes (Signed)
[place] EMERGENCY DEPARTMENT AT [facility] Provider Note   CSN: [id number] Arrival date & time: 06/27/23  2123     History  Chief Complaint  Patient presents with   Hip Pain    [name] is a 77 y.o. female.  Patient presents to the emergency department complaining of right-sided hip pain.  Patient had a fall on March 29.  She had a right wrist injury surgically fixed at that time.  The patient then had continued pain and was evaluated at the emergency department on June 30.  She was found to have an endplate fracture of T12.  Today she presents complaining of right-sided hip pain.  She had a hip replacement many years ago and is concerned that she may have had some sort of fracture or dislocation from 1 of those falls.  There were no previous hip images on file from the recent falls.  Patient denies any new trauma.  Past medical history significant for chronic back pain, T12 fracture, joint pain, muscle spasms, fibromyalgia.  Patient does have upcoming appointment with neurosurgery this Friday  HPI     Home Medications Prior to Admission medications   Medication Sig Start Date End Date Taking? Authorizing Provider  methylPREDNISolone (MEDROL DOSEPAK) 4 MG TBPK tablet Take as directed per package instructions 06/28/23  Yes Darrick Grinder, PA-C  acetaminophen (TYLENOL) 500 MG tablet Take 1 tablet (500 mg total) by mouth every 6 (six) hours as needed. Patient taking differently: Take 500 mg by mouth as needed. 08/12/20   Avegno, Zachery Dakins, FNP  ALPRAZolam Prudy Feeler) 0.5 MG tablet Take 0.5 mg by mouth as needed. 01/25/21   [provider]  Biotin 1000 MCG tablet 1 tablet    [provider]  buPROPion (WELLBUTRIN XL) 300 MG 24 hr tablet Take 300 mg by mouth every morning. 05/12/21   [provider]  Calcium Carbonate-Vitamin D (CALCIUM 600/VITAMIN D PO) Take 1 tablet by mouth daily.    [provider]  Calcium Carbonate-Vitamin D 600-200  MG-UNIT CAPS Take 1 tablet by mouth daily.    [provider]  cephALEXin (KEFLEX) 500 MG capsule Take 1 capsule (500 mg total) by mouth 3 (three) times daily for 7 days. 06/24/23 07/01/23  Marita [place], PA-C  citalopram (CELEXA) 40 MG tablet Take 40 mg by mouth daily. In the afternoon    [provider]  erythromycin ophthalmic ointment Place a 1/2 inch ribbon of ointment into the both eyelids and around eyes. 09/12/20   Wurst, [place], PA-C  ferrous sulfate 325 (65 FE) MG tablet Take 325 mg by mouth as needed.    [provider]  fluticasone (FLONASE) 50 MCG/ACT nasal spray Place 2 sprays into the nose daily.    [provider]  HYDROcodone-acetaminophen (NORCO/VICODIN) 5-325 MG tablet Take 1-2 tablets by mouth every 6 (six) hours as needed. 07/01/20   [provider]  ibuprofen (ADVIL) 200 MG tablet Take 400 mg by mouth as needed for moderate pain.    [provider]  lactulose (CHRONULAC) 10 GM/15ML solution Take 15 mLs (10 g total) by mouth daily as needed for mild constipation. 06/16/23   Valinda Hoar, NP  levothyroxine (SYNTHROID) 125 MCG tablet Take 125 mcg by mouth daily before breakfast.     [provider]  loratadine (CLARITIN) 10 MG tablet Take 10 mg by mouth daily. In the morning    [provider]  Misc Natural Products (OSTEO BI-FLEX ADV JOINT  SHIELD PO) Take 2 tablets by mouth daily.    [provider]  Omega-3 Fatty Acids (FISH OIL) 500 MG CAPS Take 500 mg by mouth daily.     [provider]  omeprazole (PRILOSEC) 40 MG capsule Take 1 capsule (40 mg total) by mouth daily before breakfast. Patient taking differently: Take 40 mg by mouth as needed. 01/07/13   Rachael Fee, MD  potassium chloride (KLOR-CON) 20 MEQ packet Take by mouth.    [provider]  potassium chloride SA (K-DUR,KLOR-CON) 20 MEQ tablet Take 40 mEq by mouth 2 (two) times daily.    [provider]   pravastatin (PRAVACHOL) 20 MG tablet Take 20 mg by mouth at bedtime.     [provider]  predniSONE (DELTASONE) 20 MG tablet Take 2 tablets (40 mg total) by mouth daily. 06/16/23   White, Elita Boone, NP  traMADol (ULTRAM) 50 MG tablet Take 1-2 tablets (50-100 mg total) by mouth every 6 (six) hours as needed for moderate pain. 03/18/20   Anson [place], PA-C  traZODone (DESYREL) 50 MG tablet Take 50 mg by mouth at bedtime.    [provider]  valsartan-hydrochlorothiazide (DIOVAN-HCT) 160-25 MG tablet Take 1 tablet by mouth daily.     [provider]      Allergies    Sulfa antibiotics, Penicillins, Cyclobenzaprine, and Sulfamethoxazole-trimethoprim    Review of Systems   Review of Systems  Physical Exam Updated Vital Signs BP (!) 133/103   Pulse 82   Temp 98.5 F (36.9 C) (Oral)   Resp 18   Ht 4' 11.5" (1.511 m)   Wt 84.4 kg   SpO2 97%   BMI 36.94 kg/m  Physical Exam Vitals and nursing note reviewed.  Constitutional:      General: She is not in acute distress.    Appearance: She is well-developed.  HENT:     Head: Normocephalic and atraumatic.  Eyes:     Conjunctiva/sclera: Conjunctivae normal.  Cardiovascular:     Rate and Rhythm: Normal rate and regular rhythm.     Heart sounds: No murmur heard. Pulmonary:     Effort: Pulmonary effort is normal. No respiratory distress.     Breath sounds: Normal breath sounds.  Abdominal:     Palpations: Abdomen is soft.     Tenderness: There is no abdominal tenderness.  Musculoskeletal:        General: No swelling, tenderness, deformity or signs of injury.     Cervical back: Neck supple.     Comments: Patient with no tenderness to the right hip/lower back on palpation.  Negative SLR.  Patient able to move hip with normal range of motion.  Patient wearing TLSO brace  Skin:    General: Skin is warm and dry.     Capillary Refill: Capillary refill takes less than 2 seconds.  Neurological:     Mental  Status: She is alert.  Psychiatric:        Mood and Affect: Mood normal.     ED Results / Procedures / Treatments   Labs (all labs ordered are listed, but only abnormal results are displayed) Labs Reviewed - No data to display  EKG None  Radiology DG Hip Unilat W or Wo Pelvis 2-3 Views Right  Result Date: 06/28/2023 CLINICAL DATA:  Right hip pain following fall, initial encounter EXAM: DG HIP (WITH OR WITHOUT PELVIS) 3V RIGHT COMPARISON:  None Available. FINDINGS: Pelvic ring is intact. Right hip replacement is noted. No  fracture or dislocation is seen. No soft tissue abnormality is noted. IMPRESSION: No acute abnormality noted. Electronically Signed   By: Alcide Clever M.D.   On: 06/28/2023 00:07    Procedures Procedures    Medications Ordered in ED Medications - No data to display  ED Course/ Medical Decision Making/ A&P                             Medical Decision Making Amount and/or Complexity of Data Reviewed Radiology: ordered.   This patient presents to the ED for concern of right-sided hip pain, this involves an extensive number of treatment options, and is a complaint that carries with it a high risk of complications and morbidity.  The differential diagnosis includes fracture, dislocation, referred pain from back injury, others   Co morbidities that complicate the patient evaluation  T12 endplate fracture   Additional history obtained:  Additional history obtained from family bedside External records from outside source obtained and reviewed including orthopedic surgery notes   Imaging Studies ordered:  I ordered imaging studies including right-sided hip pain I independently visualized and interpreted imaging which showed no acute abnormality I agree with the radiologist interpretation   Test / Admission - Considered:  No periprosthetic fracture or dislocation noted on imaging.  Based on presentation I believe pain may be radiating from patient's  back fracture.  I explained to the patient that there is no quick "fix" for her back pain.  She may continue to take the hydrocodone as prescribed previously.  I will add a prescription for a Medrol Dosepak to see if it helps with her symptoms.  She has follow-up scheduled for this Friday with neurosurgery.  Plan to have patient follow-up at that time.         Final Clinical Impression(s) / ED Diagnoses Final diagnoses:  Right hip pain    Rx / DC Orders ED Discharge Orders          Ordered    methylPREDNISolone (MEDROL DOSEPAK) 4 MG TBPK tablet        06/28/23 0025              Darrick Grinder, PA-C 06/28/23 0025    Melene Plan, DO 06/28/23 [phone]

## 2023-06-28 NOTE — Discharge Instructions (Addendum)
You were evaluated today for right-sided hip pain.  Your x-rays were reassuring with no signs of fracture or dislocation.  I recommend following up as needed with your orthopedic surgeon for further evaluation of the right hip.  This may be referred pain from the back fracture from your recent fall.  Please keep your upcoming appointment for evaluation of your spinal fracture this Friday.  I have prescribed a steroid Dosepak to be taken as directed.  Please continue to take your hydrocodone and other home medications as prescribed.

## 2023-06-28 NOTE — Telephone Encounter (Signed)
Post ED Visit - Positive Culture Follow-up: Successful Patient Follow-Up  Culture assessed and recommendations reviewed by:  []  Enzo Bi, Pharm.D. []  Celedonio Miyamoto, Pharm.D., BCPS AQ-ID []  Garvin Fila, Pharm.D., BCPS []  Georgina Pillion, Pharm.D., BCPS []  [place], [address].D., BCPS, AAHIVP []  Estella Husk, Pharm.D., BCPS, AAHIVP []  Lysle Pearl, PharmD, BCPS []  Phillips Climes, PharmD, BCPS []  Agapito Games, PharmD, BCPS []  Verlan Friends, PharmD  Positive urine culture  []  Patient discharged without antimicrobial prescription and treatment is now indicated []  Organism is resistant to prescribed ED discharge antimicrobial []  Patient with positive blood cultures  Changes discussed with ED provider: Artist Beach, MD  Plan: call patient to D/C keflex, asymptomatic bacteuria.   Pt called back and instructions given to stop taking Keflex. No pt further follow-up needed.   Contacted patient, date 06/28/23, time 9:00 am    Sandria Senter 06/28/2023, 9:02 AM

## 2023-07-02 ENCOUNTER — Other Ambulatory Visit: Payer: Self-pay | Admitting: Internal Medicine

## 2023-07-02 DIAGNOSIS — M8008XA Age-related osteoporosis with current pathological fracture, vertebra(e), initial encounter for fracture: Secondary | ICD-10-CM

## 2023-07-05 ENCOUNTER — Ambulatory Visit (HOSPITAL_COMMUNITY)
Admission: RE | Admit: 2023-07-05 | Discharge: 2023-07-05 | Disposition: A | Discharge status: Home / Self Care | Payer: 59 | Source: Ambulatory Visit | Attending: Internal Medicine | Admitting: Internal Medicine

## 2023-07-05 DIAGNOSIS — M8008XA Age-related osteoporosis with current pathological fracture, vertebra(e), initial encounter for fracture: Secondary | ICD-10-CM | POA: Diagnosis not present

## 2023-07-14 ENCOUNTER — Other Ambulatory Visit: Payer: 59

## 2023-07-17 ENCOUNTER — Telehealth (HOSPITAL_BASED_OUTPATIENT_CLINIC_OR_DEPARTMENT_OTHER): Payer: Self-pay | Admitting: *Deleted

## 2024-02-07 ENCOUNTER — Telehealth: Payer: Self-pay

## 2024-02-07 ENCOUNTER — Encounter: Payer: Self-pay | Admitting: Adult Health

## 2024-02-07 ENCOUNTER — Ambulatory Visit: Payer: 59 | Admitting: Adult Health

## 2024-02-07 VITALS — BP 135/68 | HR 82 | Ht 59.0 in | Wt 186.0 lb

## 2024-02-07 DIAGNOSIS — G4733 Obstructive sleep apnea (adult) (pediatric): Secondary | ICD-10-CM

## 2024-02-07 NOTE — Telephone Encounter (Signed)
Called patient to verify her DME company. [facility] doesn't have her as a patient. LVM to give [place] a call back.

## 2024-02-07 NOTE — Progress Notes (Signed)
[facility]   PATIENT: [name] DOB: 1946-05-30  REASON FOR VISIT: follow up HISTORY FROM: patient  Chief Complaint  Patient presents with   Follow-up    Patient in room #19 and alone. Patient states she is needing a new masks for her CPAP machine.     HISTORY OF PRESENT ILLNESS: Today 02/07/24:  [name] is a 78 y.o. female with a history of OSA on CPAP. Returns today for follow-up.  She reports that the CPAP is working well.  She does need a new mask.  Her download is below     02/06/23: [name] is a 78 y.o. female with a history of obstructive sleep apnea on CPAP. Returns today for follow-up.  She reports that the CPAP continues to work well for her.  Continues to notice the benefit.  Download is below.     01/11/22: [name] is a 78 year old female with a history of obstructive sleep apnea on CPAP.  She returns today for follow-up.  She reports the CPAP is working well for her.  She reports that her husband passed away last year.  She has been processing her grief.    01/06/21: [name] is a 78 year old female with a history of obstructive sleep apnea on CPAP.  Her download indicates that she used her machine nightly for compliance of 100%.  She used her machine greater than 4 hours each night.  On average she uses her machine 7 hours and 38 minutes.  Her residual AHI is 4.2 on 6 to 12 cm of water with EPR of 3.  Her leak in the 95th percentile is 29.1 L/min.  She reports that the CPAP is working well for her.  She does state that she is under a significant mount of stress as her husband has been diagnosed with cancer.  She returns today for an evaluation.   HISTORY (Copied from Dr.Dohmeier's note)  I have the pleasure of meeting [name] today, she has been a patient in our practice in 2016 at the time her main problem was snoring obesity she underwent a sleep study and was diagnosed with mild sleep apnea I "hear from her sleep study dated 08/10/2015.  Patient  endorsed the Epworth Sleepiness Scale at 16 out of 24 points with a high degree of sleepiness and also endorses depression scale at 18 points.  BMI 44.7 at the time of this consultation.  The patient had 6 obstructive apneas 13 central and 40 hypopneas her AHI was 9.7.  RDI was 11.8.  REM accentuated the AHI to 13.3 but it was not REM dependent sleep apnea.  She had frequent periodic limb movements with an arousal index of 7.2/h.  I wrote at the time for a CPAP machine and she shows a [employer] a flex which is now 49 years old or older.  The machine provides an average AHI of 4/h now present expiratory pressure setting of 18 of an inspiratory pressure setting of 21 this is a BiPAP setting 21/18 a rather high pressure she did use a CPAP pressure as well and the auto CPAP mean pressure 90% of the time is 10.8 cmH2O not quite sure if the machine switches between 2 functions were was recently altered.  She has been using the machine for 100% of the last 30 days and her average use at time is 9 hours 45 seconds.  The device apparently was set to 8 CPAP Ultraflex with a minimum pressure of  5 maximum pressure of 12 and to centimeter a flex setting this is what I had ordered originally.   She still feels like she does wake up just is exhausted as she was when to when she went to bed.   Her new fatigue severity score is 48 /63 points her Epworth sleepiness score is 16 / 24 again highly elevated.   Was followed by [facility]-    Dr. Felipa Eth states that she would be presented to a routine annual evaluation at his office on July 20 she had some external stressors the health of her husband as well as her sister's husband dying of COVID in January.   She feels very tired she has not been seen by neurology in 5 years and is highly suspicious that her CPAP machine may need adjustment but actually improved with need replacement.     She has a history of a pituitary abnormality of frequent stool, chronic overactive  bladder, urge incontinence, and a longstanding history of gastritis, hyperlipidemia, edema lower extremity edema obesity, obstructive sleep apnea GERD, CT abdomen and pelvis was possible lipoma small hiatal hernia, osteopenia since 2007 fibrocystic myofascial syndrome, is followed by Dr. 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  She is now followed by Dr. Cleon Gustin, she has a carpal tunnel syndrome and hypothyroidism.  REVIEW OF SYSTEMS: Out of a complete 14 system review of symptoms, the patient complains only of the following symptoms, and all other reviewed systems are negative.  FSS 32 ESS 5  ALLERGIES: Allergies  Allergen Reactions   Sulfa Antibiotics Swelling    Face, throat, body   Penicillins Itching    Has patient had a PCN reaction causing immediate rash, facial/tongue/throat swelling, SOB or lightheadedness with hypotension: No Has patient had a PCN reaction causing severe rash involving mucus membranes or skin necrosis: No Has patient had a PCN reaction that required hospitalization No Has patient had a PCN reaction occurring within the last 10 years: No If all of the above answers are "NO", then may proceed with Cephalosporin use.    Cyclobenzaprine     Other reaction(s): Other (See Comments), Unknown   Sulfamethoxazole-Trimethoprim     Other reaction(s): Other (See Comments), Unknown    HOME MEDICATIONS: Outpatient Medications Prior to Visit  Medication Sig Dispense Refill   acetaminophen (TYLENOL) 500 MG tablet Take 1 tablet (500 mg total) by mouth every 6 (six) hours as needed. (Patient taking differently: Take 500 mg by mouth as needed.) 30 tablet 0   ALPRAZolam (XANAX) 0.5 MG tablet Take 0.5 mg by mouth as needed.     buPROPion (WELLBUTRIN XL) 300 MG 24 hr tablet Take 300 mg by mouth every morning.     Calcium Carbonate-Vitamin D 600-200 MG-UNIT CAPS Take 1 tablet by mouth daily.     erythromycin  ophthalmic ointment Place a 1/2 inch ribbon of ointment into the both eyelids and around eyes. 3.5 g 0   fluticasone (FLONASE) 50 MCG/ACT nasal spray Place 2 sprays into the nose daily.     HYDROcodone-acetaminophen (NORCO/VICODIN) 5-325 MG tablet Take 1-2 tablets by mouth every 6 (six) hours as needed.     ibuprofen (ADVIL) 200 MG tablet Take 400 mg by mouth as needed for moderate pain.     lactulose (CHRONULAC) 10 GM/15ML solution Take 15 mLs (10 g total) by mouth daily as needed for mild constipation. 236 mL 0   levothyroxine (SYNTHROID) 125 MCG tablet  Take 137 mcg by mouth daily before breakfast.     loratadine (CLARITIN) 10 MG tablet Take 10 mg by mouth daily. In the morning     Misc Natural Products (OSTEO BI-FLEX ADV JOINT SHIELD PO) Take 2 tablets by mouth daily.     omeprazole (PRILOSEC) 40 MG capsule Take 1 capsule (40 mg total) by mouth daily before breakfast. (Patient taking differently: Take 40 mg by mouth as needed.) 30 capsule 11   potassium chloride SA (K-DUR,KLOR-CON) 20 MEQ tablet Take 40 mEq by mouth 2 (two) times daily.     pravastatin (PRAVACHOL) 20 MG tablet Take 20 mg by mouth at bedtime.      predniSONE (DELTASONE) 20 MG tablet Take 2 tablets (40 mg total) by mouth daily. 10 tablet 0   traMADol (ULTRAM) 50 MG tablet Take 1-2 tablets (50-100 mg total) by mouth every 6 (six) hours as needed for moderate pain. 40 tablet 0   traZODone (DESYREL) 50 MG tablet Take 50 mg by mouth at bedtime.     valsartan-hydrochlorothiazide (DIOVAN-HCT) 160-25 MG tablet Take 1 tablet by mouth daily.      Biotin 1000 MCG tablet 1 tablet (Patient not taking: Reported on 02/07/2024)     Calcium Carbonate-Vitamin D (CALCIUM 600/VITAMIN D PO) Take 1 tablet by mouth daily. (Patient not taking: Reported on 02/07/2024)     citalopram (CELEXA) 40 MG tablet Take 40 mg by mouth daily. In the afternoon (Patient not taking: Reported on 02/07/2024)     ferrous sulfate 325 (65 FE) MG tablet Take 325 mg by mouth as  needed. (Patient not taking: Reported on 02/07/2024)     methylPREDNISolone (MEDROL DOSEPAK) 4 MG TBPK tablet Take as directed per package instructions (Patient not taking: Reported on 02/07/2024) 21 tablet 0   Omega-3 Fatty Acids (FISH OIL) 500 MG CAPS Take 500 mg by mouth daily.  (Patient not taking: Reported on 02/07/2024)     potassium chloride (KLOR-CON) 20 MEQ packet Take by mouth. (Patient not taking: Reported on 02/07/2024)     No facility-administered medications prior to visit.    PAST MEDICAL HISTORY: Past Medical History:  Diagnosis Date   Allergy    SEASONAL-flonase daily as well as Claritin   Anemia    takes Ferrous Sulfate daily   Anxiety    takes Celexa every evening   Arthritis    BACK/KNEE   Cataract    IMMATURE AND ON LEFT EYE SHE THINKS   Chronic back pain    Chronic back pain    L1 FRACTURE   Depression    takes Wellbutrin daily   Diabetes mellitus without complication (HCC)    type II   Dyspnea    with exertion   Fibromyalgia    GERD (gastroesophageal reflux disease)    takes Omeprazole daily and Zantac at bedtime   H/O hiatal hernia    History of colon polyps    History of shingles    Hyperlipidemia    takes Pravastatin daily as well as Questran   Hypertension    takes Diovan daily   Hypothyroidism    Insomnia    takes trazodone nightly   Joint pain    Muscle spasm    takes Robaxin daily as needed   Nausea    takes Zofran daily as needed   Nocturia    Numbness and tingling    LEGS   Osteoporosis    takes fosamax   Sleep apnea    uses cpap  Thyroid disease    takes Synthroid daily    PAST SURGICAL HISTORY: Past Surgical History:  Procedure Laterality Date   ABDOMINAL HYSTERECTOMY     CARDIAC CATHETERIZATION     CARPAL TUNNEL RELEASE     right and lt   CHOLECYSTECTOMY     COLONOSCOPY     COLONOSCOPY     EYE SURGERY Bilateral    Cataract with lens   FOOT SURGERY Bilateral    FRACTURE SURGERY     rt hand.  no metal   FRACTURE  SURGERY     lr wrist. no metal   HAND SURGERY Right 06/1985   HAND SURGERY Left    JOINT REPLACEMENT Right    THR   KNEE ARTHROSCOPY Left    KNEE ARTHROSCOPY Bilateral    KYPHOPLASTY N/A 12/21/2014   Procedure: KYPHOPLASTY LUMBAR ONE;  Surgeon: Tressie Stalker, MD;  Location: [facility];  Service: Neurosurgery;  Laterality: N/A;  L1 Kyphoplasty   ORIF FINGER FRACTURE  04/11/2012   Procedure: OPEN REDUCTION INTERNAL FIXATION (ORIF) METACARPAL (FINGER) FRACTURE;  Surgeon: Loreta Ave, MD;  Location: [facility];  Service: Orthopedics;  Laterality: Left;  orif left hand 4th metacarpal    PITUITARY SURGERY  07/2007   tumor resection, BENIGN   RECTOCELE REPAIR     SHOULDER ARTHROSCOPY WITH DISTAL CLAVICLE RESECTION Right 05/20/2015   Procedure: SHOULDER ARTHROSCOPY WITH DISTAL CLAVICLE RESECTION DEBRIDEMENT PARTIAL ROTATOR CUFF LABRAL TEAR REMOVAL LOOSE BODIES;  Surgeon: Mckinley Jewel, MD;  Location: [facility];  Service: Orthopedics;  Laterality: Right;   TONSILLECTOMY     TOTAL HIP ARTHROPLASTY Right 10/18/2016   Procedure: TOTAL HIP ARTHROPLASTY ANTERIOR APPROACH;  Surgeon: Loreta Ave, MD;  Location: [facility];  Service: Orthopedics;  Laterality: Right;   TOTAL KNEE ARTHROPLASTY Left 03/16/2020   Procedure: TOTAL KNEE ARTHROPLASTY;  Surgeon: Christena Flake, MD;  Location: [facility];  Service: Orthopedics;  Laterality: Left;   UPPER GASTROINTESTINAL ENDOSCOPY  02/2013   WRIST SURGERY Left     FAMILY HISTORY: Family History  Problem Relation Age of Onset   Hypertension Mother    Diabetes Mother    Lung cancer Sister    Heart attack Brother        x 2   COPD Brother    Lung cancer Paternal Aunt    Cancer Paternal Uncle    Cancer Maternal Grandfather        back   Sleep apnea Neg Hx     SOCIAL HISTORY: Social History   Socioeconomic History   Marital status: Widowed    Spouse name: Kathlene November   Number of children: 5   Years of education: Not on file    Highest education level: Not on file  Occupational History   Occupation: retired  Tobacco Use   Smoking status: Former   Smokeless tobacco: Never   Tobacco comments:    as a young erson, off and on , never had a habit  Vaping Use   Vaping status: Never Used  Substance and Sexual Activity   Alcohol use: No   Drug use: No   Sexual activity: Yes    Birth control/protection: Surgical  Other Topics Concern   Not on file  Social History Narrative   Drinks 1 cup of coffee daily. 1 diet pepsi.   Lives with husband   Social Drivers of Corporate investment banker Strain: Not on file  Food Insecurity: Not on file  Transportation  Needs: Not on file  Physical Activity: Not on file  Stress: Not on file  Social Connections: Not on file  Intimate Partner Violence: Not on file      PHYSICAL EXAM  Vitals:   02/07/24 1047  BP: 135/68  Pulse: 82  Weight: 186 lb (84.4 kg)  Height: 4\' 11"  (1.499 m)    Body mass index is 37.57 kg/m.  Generalized: Well developed, in no acute distress  Chest: Lungs clear to auscultation bilaterally  Neurological examination  Mentation: Alert oriented to time, place, history taking. Follows all commands speech and language fluent Cranial nerve II-XII: Extraocular movements were full, visual field were full on confrontational test Head turning and shoulder shrug  were normal and symmetric.    DIAGNOSTIC DATA (LABS, IMAGING, TESTING) - I reviewed patient records, labs, notes, testing and imaging myself where available.  Lab Results  Component Value Date   WBC 10.7 (H) 06/24/2023   HGB 14.5 06/24/2023   HCT 44.7 06/24/2023   MCV 87.1 06/24/2023   PLT 202 06/24/2023      Component Value Date/Time   NA 138 06/24/2023 1213   K 3.4 (L) 06/24/2023 1213   CL 104 06/24/2023 1213   CO2 24 06/24/2023 1213   GLUCOSE 84 06/24/2023 1213   BUN 25 (H) 06/24/2023 1213   CREATININE 0.85 06/24/2023 1213   CALCIUM 9.3 06/24/2023 1213   PROT 7.1  06/24/2023 1213   ALBUMIN 3.9 06/24/2023 1213   AST 32 06/24/2023 1213   ALT 25 06/24/2023 1213   ALKPHOS 100 06/24/2023 1213   BILITOT 1.3 (H) 06/24/2023 1213   GFRNONAA >60 06/24/2023 1213   GFRAA >60 03/19/2020 0505      ASSESSMENT AND PLAN 78 y.o. year old female  has a past medical history of Allergy, Anemia, Anxiety, Arthritis, Cataract, Chronic back pain, Chronic back pain, Depression, Diabetes mellitus without complication (HCC), Dyspnea, Fibromyalgia, GERD (gastroesophageal reflux disease), H/O hiatal hernia, History of colon polyps, History of shingles, Hyperlipidemia, Hypertension, Hypothyroidism, Insomnia, Joint pain, Muscle spasm, Nausea, Nocturia, Numbness and tingling, Osteoporosis, Sleep apnea, and Thyroid disease. here with:  OSA on CPAP  - CPAP compliance excellent - Good treatment of AHI  - Encourage patient to use CPAP nightly and > 4 hours each night - Order sent for mask refitting - F/U in 1 year or sooner if needed   Butch Penny, MSN, NP-C 02/07/2024, 10:58 AM [facility] [address], [address] [place], [place] [address] [phone]

## 2024-02-07 NOTE — Patient Instructions (Signed)
Continue using CPAP nightly and greater than 4 hours each night If your symptoms worsen or you develop new symptoms please let us know.

## 2024-02-12 NOTE — Telephone Encounter (Signed)
I called pt yesterday.  I spoke to her about her DME.   [name] is not taking her insurance.  She was ok with any in [place] or [place].  I told her about [employer].  I called them they would check insurance, could do supplies but not new machine.  I did relay this to pt.  She was ok to see.  I faxed to them 15 pgs for them to review and then will see.  (Late entry).

## 2024-02-12 NOTE — Telephone Encounter (Signed)
Called patient to verify her DME company is [employer]. Orders has been sent to [employer].

## 2024-02-18 NOTE — Telephone Encounter (Signed)
 I refaxed to [employer] 24 pgs as requested. [phone]  (received fax confirmation).

## 2024-08-13 ENCOUNTER — Ambulatory Visit (HOSPITAL_COMMUNITY)

## 2025-02-12 ENCOUNTER — Ambulatory Visit: Payer: 59 | Admitting: Adult Health
